# Patient Record
Sex: Female | Born: 1950 | Race: White | Hispanic: No | Marital: Married | Smoking: Never smoker
Health system: Southern US, Community
[De-identification: ages and names within clinical notes are randomized; demographics above are authoritative.]

## PROBLEM LIST (undated history)

## (undated) DIAGNOSIS — C50919 Malignant neoplasm of unspecified site of unspecified female breast: Secondary | ICD-10-CM

## (undated) DIAGNOSIS — I10 Essential (primary) hypertension: Secondary | ICD-10-CM

## (undated) DIAGNOSIS — K30 Functional dyspepsia: Secondary | ICD-10-CM

## (undated) DIAGNOSIS — E7800 Pure hypercholesterolemia, unspecified: Secondary | ICD-10-CM

## (undated) DIAGNOSIS — K219 Gastro-esophageal reflux disease without esophagitis: Secondary | ICD-10-CM

## (undated) HISTORY — DX: Gastro-esophageal reflux disease without esophagitis: K21.9

## (undated) HISTORY — DX: Pure hypercholesterolemia, unspecified: E78.00

## (undated) HISTORY — DX: Functional dyspepsia: K30

## (undated) HISTORY — DX: Essential (primary) hypertension: I10

---

## 1992-09-03 HISTORY — PX: BREAST LUMPECTOMY: SHX2

## 1998-04-27 ENCOUNTER — Other Ambulatory Visit: Admission: RE | Admit: 1998-04-27 | Discharge: 1998-04-27 | Payer: Self-pay | Admitting: *Deleted

## 1999-10-19 ENCOUNTER — Other Ambulatory Visit: Admission: RE | Admit: 1999-10-19 | Discharge: 1999-10-19 | Payer: Self-pay | Admitting: *Deleted

## 2004-09-03 DIAGNOSIS — Z923 Personal history of irradiation: Secondary | ICD-10-CM

## 2004-09-03 DIAGNOSIS — Z9221 Personal history of antineoplastic chemotherapy: Secondary | ICD-10-CM

## 2004-09-03 DIAGNOSIS — C50919 Malignant neoplasm of unspecified site of unspecified female breast: Secondary | ICD-10-CM

## 2004-09-03 HISTORY — DX: Personal history of antineoplastic chemotherapy: Z92.21

## 2004-09-03 HISTORY — PX: BREAST LUMPECTOMY: SHX2

## 2004-09-03 HISTORY — DX: Malignant neoplasm of unspecified site of unspecified female breast: C50.919

## 2004-09-03 HISTORY — DX: Personal history of irradiation: Z92.3

## 2005-01-26 DIAGNOSIS — Z853 Personal history of malignant neoplasm of breast: Secondary | ICD-10-CM

## 2005-03-02 ENCOUNTER — Encounter: Admission: RE | Admit: 2005-03-02 | Discharge: 2005-03-02 | Payer: Self-pay | Admitting: Obstetrics and Gynecology

## 2005-03-02 ENCOUNTER — Encounter (INDEPENDENT_AMBULATORY_CARE_PROVIDER_SITE_OTHER): Payer: Self-pay | Admitting: Radiology

## 2005-03-02 ENCOUNTER — Encounter (INDEPENDENT_AMBULATORY_CARE_PROVIDER_SITE_OTHER): Payer: Self-pay | Admitting: *Deleted

## 2005-03-07 ENCOUNTER — Ambulatory Visit (HOSPITAL_COMMUNITY): Admission: RE | Admit: 2005-03-07 | Discharge: 2005-03-07 | Payer: Self-pay | Admitting: Obstetrics and Gynecology

## 2005-03-20 ENCOUNTER — Ambulatory Visit (HOSPITAL_COMMUNITY): Admission: RE | Admit: 2005-03-20 | Discharge: 2005-03-20 | Payer: Self-pay | Admitting: General Surgery

## 2005-03-23 ENCOUNTER — Encounter (INDEPENDENT_AMBULATORY_CARE_PROVIDER_SITE_OTHER): Payer: Self-pay | Admitting: Specialist

## 2005-03-23 ENCOUNTER — Ambulatory Visit (HOSPITAL_COMMUNITY): Admission: RE | Admit: 2005-03-23 | Discharge: 2005-03-23 | Payer: Self-pay | Admitting: General Surgery

## 2005-03-23 ENCOUNTER — Ambulatory Visit (HOSPITAL_BASED_OUTPATIENT_CLINIC_OR_DEPARTMENT_OTHER): Admission: RE | Admit: 2005-03-23 | Discharge: 2005-03-23 | Payer: Self-pay | Admitting: General Surgery

## 2005-03-27 ENCOUNTER — Ambulatory Visit: Payer: Self-pay | Admitting: Oncology

## 2005-04-09 ENCOUNTER — Ambulatory Visit: Admission: RE | Admit: 2005-04-09 | Discharge: 2005-05-22 | Payer: Self-pay | Admitting: *Deleted

## 2005-04-12 ENCOUNTER — Encounter (HOSPITAL_COMMUNITY): Admission: RE | Admit: 2005-04-12 | Discharge: 2005-05-12 | Payer: Self-pay | Admitting: Oncology

## 2005-04-25 ENCOUNTER — Ambulatory Visit (HOSPITAL_COMMUNITY): Admission: RE | Admit: 2005-04-25 | Discharge: 2005-04-25 | Payer: Self-pay | Admitting: General Surgery

## 2005-04-25 ENCOUNTER — Ambulatory Visit (HOSPITAL_BASED_OUTPATIENT_CLINIC_OR_DEPARTMENT_OTHER): Admission: RE | Admit: 2005-04-25 | Discharge: 2005-04-25 | Payer: Self-pay | Admitting: General Surgery

## 2005-05-11 ENCOUNTER — Ambulatory Visit (HOSPITAL_COMMUNITY): Admission: RE | Admit: 2005-05-11 | Discharge: 2005-05-11 | Payer: Self-pay | Admitting: Oncology

## 2005-05-17 ENCOUNTER — Ambulatory Visit: Payer: Self-pay | Admitting: Oncology

## 2005-05-22 ENCOUNTER — Ambulatory Visit (HOSPITAL_COMMUNITY): Admission: RE | Admit: 2005-05-22 | Discharge: 2005-05-22 | Payer: Self-pay | Admitting: Oncology

## 2005-07-05 ENCOUNTER — Ambulatory Visit: Payer: Self-pay | Admitting: Oncology

## 2005-08-23 ENCOUNTER — Ambulatory Visit: Payer: Self-pay | Admitting: Oncology

## 2005-09-13 ENCOUNTER — Ambulatory Visit: Admission: RE | Admit: 2005-09-13 | Discharge: 2005-11-23 | Payer: Self-pay | Admitting: *Deleted

## 2005-10-10 ENCOUNTER — Ambulatory Visit: Payer: Self-pay | Admitting: Oncology

## 2005-12-05 ENCOUNTER — Ambulatory Visit: Payer: Self-pay | Admitting: Oncology

## 2005-12-06 LAB — CBC WITH DIFFERENTIAL/PLATELET
Basophils Absolute: 0 10*3/uL (ref 0.0–0.1)
Eosinophils Absolute: 0.1 10*3/uL (ref 0.0–0.5)
HGB: 11.5 g/dL — ABNORMAL LOW (ref 11.6–15.9)
LYMPH%: 23.2 % (ref 14.0–48.0)
MCV: 82.9 fL (ref 81.0–101.0)
MONO%: 13 % (ref 0.0–13.0)
NEUT#: 2.1 10*3/uL (ref 1.5–6.5)
Platelets: 198 10*3/uL (ref 145–400)

## 2005-12-19 LAB — COMPREHENSIVE METABOLIC PANEL
BUN: 10 mg/dL (ref 6–23)
CO2: 28 mEq/L (ref 19–32)
Calcium: 9.4 mg/dL (ref 8.4–10.5)
Chloride: 103 mEq/L (ref 96–112)
Creatinine, Ser: 0.8 mg/dL (ref 0.4–1.2)
Glucose, Bld: 115 mg/dL — ABNORMAL HIGH (ref 70–99)

## 2005-12-19 LAB — CBC WITH DIFFERENTIAL/PLATELET
BASO%: 1.1 % (ref 0.0–2.0)
LYMPH%: 22.5 % (ref 14.0–48.0)
MCH: 28.1 pg (ref 26.0–34.0)
MCHC: 33.6 g/dL (ref 32.0–36.0)
MCV: 83.6 fL (ref 81.0–101.0)
MONO%: 8.9 % (ref 0.0–13.0)
Platelets: 200 10*3/uL (ref 145–400)
RBC: 3.95 10*6/uL (ref 3.70–5.32)

## 2005-12-19 LAB — CANCER ANTIGEN 27.29: CA 27.29: 27 U/mL (ref 0–39)

## 2005-12-19 LAB — LACTATE DEHYDROGENASE: LDH: 190 U/L (ref 94–250)

## 2006-01-07 ENCOUNTER — Encounter (HOSPITAL_COMMUNITY): Admission: RE | Admit: 2006-01-07 | Discharge: 2006-02-06 | Payer: Self-pay | Admitting: Oncology

## 2006-01-11 ENCOUNTER — Ambulatory Visit (HOSPITAL_COMMUNITY): Admission: RE | Admit: 2006-01-11 | Discharge: 2006-01-11 | Payer: Self-pay | Admitting: Oncology

## 2006-01-23 ENCOUNTER — Ambulatory Visit: Payer: Self-pay | Admitting: Oncology

## 2006-01-25 LAB — CBC WITH DIFFERENTIAL/PLATELET
Basophils Absolute: 0 10*3/uL (ref 0.0–0.1)
Eosinophils Absolute: 0 10*3/uL (ref 0.0–0.5)
HCT: 34.3 % — ABNORMAL LOW (ref 34.8–46.6)
HGB: 11.7 g/dL (ref 11.6–15.9)
MCV: 84.6 fL (ref 81.0–101.0)
MONO%: 6.9 % (ref 0.0–13.0)
NEUT#: 3.1 10*3/uL (ref 1.5–6.5)
NEUT%: 70.3 % (ref 39.6–76.8)
RDW: 15.5 % — ABNORMAL HIGH (ref 11.3–14.5)

## 2006-03-07 ENCOUNTER — Encounter: Admission: RE | Admit: 2006-03-07 | Discharge: 2006-03-07 | Payer: Self-pay | Admitting: Oncology

## 2006-04-02 ENCOUNTER — Ambulatory Visit: Payer: Self-pay | Admitting: Oncology

## 2006-04-03 LAB — CBC WITH DIFFERENTIAL/PLATELET
BASO%: 0.6 % (ref 0.0–2.0)
EOS%: 0.6 % (ref 0.0–7.0)
MCH: 30.5 pg (ref 26.0–34.0)
MCHC: 34.7 g/dL (ref 32.0–36.0)
MCV: 88 fL (ref 81.0–101.0)
MONO%: 5.8 % (ref 0.0–13.0)
RBC: 3.88 10*6/uL (ref 3.70–5.32)
RDW: 14.9 % — ABNORMAL HIGH (ref 11.3–14.5)
lymph#: 1 10*3/uL (ref 0.9–3.3)

## 2006-04-03 LAB — COMPREHENSIVE METABOLIC PANEL
AST: 16 U/L (ref 0–37)
Alkaline Phosphatase: 48 U/L (ref 39–117)
BUN: 13 mg/dL (ref 6–23)
Calcium: 9.6 mg/dL (ref 8.4–10.5)
Chloride: 104 mEq/L (ref 96–112)
Creatinine, Ser: 0.84 mg/dL (ref 0.40–1.20)
Total Bilirubin: 0.6 mg/dL (ref 0.3–1.2)

## 2006-04-03 LAB — CANCER ANTIGEN 27.29: CA 27.29: 26 U/mL (ref 0–39)

## 2006-04-10 ENCOUNTER — Ambulatory Visit (HOSPITAL_COMMUNITY): Admission: RE | Admit: 2006-04-10 | Discharge: 2006-04-10 | Payer: Self-pay | Admitting: Oncology

## 2006-04-11 ENCOUNTER — Ambulatory Visit (HOSPITAL_COMMUNITY): Admission: RE | Admit: 2006-04-11 | Discharge: 2006-04-11 | Payer: Self-pay | Admitting: Oncology

## 2006-05-14 ENCOUNTER — Other Ambulatory Visit: Admission: RE | Admit: 2006-05-14 | Discharge: 2006-05-14 | Payer: Self-pay | Admitting: Obstetrics and Gynecology

## 2006-05-24 ENCOUNTER — Ambulatory Visit (HOSPITAL_COMMUNITY): Admission: RE | Admit: 2006-05-24 | Discharge: 2006-05-24 | Payer: Self-pay | Admitting: Family Medicine

## 2006-06-17 ENCOUNTER — Ambulatory Visit: Payer: Self-pay | Admitting: Oncology

## 2006-06-19 LAB — COMPREHENSIVE METABOLIC PANEL
ALT: 9 U/L (ref 0–40)
CO2: 26 mEq/L (ref 19–32)
Calcium: 9.6 mg/dL (ref 8.4–10.5)
Chloride: 106 mEq/L (ref 96–112)
Creatinine, Ser: 0.82 mg/dL (ref 0.40–1.20)
Glucose, Bld: 91 mg/dL (ref 70–99)
Sodium: 141 mEq/L (ref 135–145)
Total Bilirubin: 0.5 mg/dL (ref 0.3–1.2)
Total Protein: 7.3 g/dL (ref 6.0–8.3)

## 2006-06-19 LAB — CBC WITH DIFFERENTIAL/PLATELET
BASO%: 0.5 % (ref 0.0–2.0)
Eosinophils Absolute: 0 10*3/uL (ref 0.0–0.5)
HCT: 39.1 % (ref 34.8–46.6)
LYMPH%: 15.9 % (ref 14.0–48.0)
MCHC: 34.5 g/dL (ref 32.0–36.0)
MONO#: 0.3 10*3/uL (ref 0.1–0.9)
NEUT#: 5.1 10*3/uL (ref 1.5–6.5)
NEUT%: 78.2 % — ABNORMAL HIGH (ref 39.6–76.8)
Platelets: 193 10*3/uL (ref 145–400)
WBC: 6.6 10*3/uL (ref 3.9–10.0)
lymph#: 1 10*3/uL (ref 0.9–3.3)

## 2006-06-19 LAB — CANCER ANTIGEN 27.29: CA 27.29: 39 U/mL (ref 0–39)

## 2006-06-19 LAB — LACTATE DEHYDROGENASE: LDH: 161 U/L (ref 94–250)

## 2006-06-20 ENCOUNTER — Ambulatory Visit (HOSPITAL_COMMUNITY): Admission: RE | Admit: 2006-06-20 | Discharge: 2006-06-20 | Payer: Self-pay | Admitting: Oncology

## 2006-08-16 ENCOUNTER — Encounter (HOSPITAL_COMMUNITY): Admission: RE | Admit: 2006-08-16 | Discharge: 2006-09-02 | Payer: Self-pay | Admitting: Oncology

## 2006-08-16 ENCOUNTER — Ambulatory Visit (HOSPITAL_COMMUNITY): Payer: Self-pay | Admitting: Oncology

## 2006-08-30 ENCOUNTER — Ambulatory Visit: Admission: RE | Admit: 2006-08-30 | Discharge: 2006-11-15 | Payer: Self-pay | Admitting: *Deleted

## 2006-11-12 ENCOUNTER — Ambulatory Visit (HOSPITAL_COMMUNITY): Payer: Self-pay | Admitting: Oncology

## 2006-11-12 ENCOUNTER — Encounter (HOSPITAL_COMMUNITY): Admission: RE | Admit: 2006-11-12 | Discharge: 2006-12-12 | Payer: Self-pay | Admitting: Oncology

## 2006-11-20 ENCOUNTER — Encounter (HOSPITAL_COMMUNITY): Admission: RE | Admit: 2006-11-20 | Discharge: 2006-12-20 | Payer: Self-pay | Admitting: Oncology

## 2007-03-19 ENCOUNTER — Encounter: Admission: RE | Admit: 2007-03-19 | Discharge: 2007-03-19 | Payer: Self-pay | Admitting: Oncology

## 2007-04-09 ENCOUNTER — Ambulatory Visit: Payer: Self-pay | Admitting: Gastroenterology

## 2007-04-16 ENCOUNTER — Encounter (HOSPITAL_COMMUNITY): Admission: RE | Admit: 2007-04-16 | Discharge: 2007-05-16 | Payer: Self-pay | Admitting: Oncology

## 2007-05-13 ENCOUNTER — Ambulatory Visit (HOSPITAL_COMMUNITY): Payer: Self-pay | Admitting: Oncology

## 2007-05-28 ENCOUNTER — Encounter: Admission: RE | Admit: 2007-05-28 | Discharge: 2007-06-30 | Payer: Self-pay | Admitting: Oncology

## 2007-06-10 ENCOUNTER — Encounter (HOSPITAL_COMMUNITY): Admission: RE | Admit: 2007-06-10 | Discharge: 2007-07-10 | Payer: Self-pay | Admitting: Oncology

## 2007-08-20 ENCOUNTER — Ambulatory Visit: Payer: Self-pay | Admitting: Gastroenterology

## 2007-08-25 ENCOUNTER — Ambulatory Visit (HOSPITAL_COMMUNITY): Payer: Self-pay | Admitting: Oncology

## 2007-09-04 HISTORY — PX: ESOPHAGOGASTRODUODENOSCOPY: SHX1529

## 2007-09-04 HISTORY — PX: COLONOSCOPY: SHX5424

## 2007-09-09 ENCOUNTER — Encounter (HOSPITAL_COMMUNITY): Admission: RE | Admit: 2007-09-09 | Discharge: 2007-10-09 | Payer: Self-pay | Admitting: Oncology

## 2007-09-22 ENCOUNTER — Ambulatory Visit (HOSPITAL_COMMUNITY): Admission: RE | Admit: 2007-09-22 | Discharge: 2007-09-22 | Payer: Self-pay | Admitting: Gastroenterology

## 2007-09-22 ENCOUNTER — Ambulatory Visit: Payer: Self-pay | Admitting: Gastroenterology

## 2007-10-31 ENCOUNTER — Other Ambulatory Visit: Admission: RE | Admit: 2007-10-31 | Discharge: 2007-10-31 | Payer: Self-pay | Admitting: Dentistry

## 2007-10-31 ENCOUNTER — Encounter (INDEPENDENT_AMBULATORY_CARE_PROVIDER_SITE_OTHER): Payer: Self-pay | Admitting: Dentistry

## 2007-11-11 ENCOUNTER — Encounter (HOSPITAL_COMMUNITY): Admission: RE | Admit: 2007-11-11 | Discharge: 2007-12-11 | Payer: Self-pay | Admitting: Oncology

## 2007-11-11 ENCOUNTER — Ambulatory Visit (HOSPITAL_COMMUNITY): Payer: Self-pay | Admitting: Oncology

## 2008-03-19 ENCOUNTER — Encounter: Admission: RE | Admit: 2008-03-19 | Discharge: 2008-03-19 | Payer: Self-pay | Admitting: Oncology

## 2008-06-01 ENCOUNTER — Ambulatory Visit (HOSPITAL_COMMUNITY): Payer: Self-pay | Admitting: Oncology

## 2008-07-26 ENCOUNTER — Encounter (HOSPITAL_COMMUNITY): Admission: RE | Admit: 2008-07-26 | Discharge: 2008-08-25 | Payer: Self-pay | Admitting: Oncology

## 2008-07-26 ENCOUNTER — Ambulatory Visit (HOSPITAL_COMMUNITY): Payer: Self-pay | Admitting: Oncology

## 2008-08-10 ENCOUNTER — Ambulatory Visit: Payer: Self-pay | Admitting: Gastroenterology

## 2008-08-11 ENCOUNTER — Ambulatory Visit (HOSPITAL_COMMUNITY): Admission: RE | Admit: 2008-08-11 | Discharge: 2008-08-11 | Payer: Self-pay | Admitting: Gastroenterology

## 2008-11-29 ENCOUNTER — Encounter (HOSPITAL_COMMUNITY): Admission: RE | Admit: 2008-11-29 | Discharge: 2008-11-29 | Payer: Self-pay | Admitting: Oncology

## 2008-11-29 ENCOUNTER — Ambulatory Visit (HOSPITAL_COMMUNITY): Payer: Self-pay | Admitting: Oncology

## 2009-01-26 DIAGNOSIS — K649 Unspecified hemorrhoids: Secondary | ICD-10-CM | POA: Insufficient documentation

## 2009-01-27 ENCOUNTER — Ambulatory Visit: Payer: Self-pay | Admitting: Gastroenterology

## 2009-01-27 DIAGNOSIS — K219 Gastro-esophageal reflux disease without esophagitis: Secondary | ICD-10-CM | POA: Insufficient documentation

## 2009-03-24 ENCOUNTER — Ambulatory Visit (HOSPITAL_COMMUNITY): Admission: RE | Admit: 2009-03-24 | Discharge: 2009-03-24 | Payer: Self-pay | Admitting: Oncology

## 2009-04-18 ENCOUNTER — Ambulatory Visit (HOSPITAL_COMMUNITY): Payer: Self-pay | Admitting: Oncology

## 2009-04-20 ENCOUNTER — Ambulatory Visit (HOSPITAL_COMMUNITY): Admission: RE | Admit: 2009-04-20 | Discharge: 2009-04-20 | Payer: Self-pay | Admitting: Oncology

## 2009-07-18 ENCOUNTER — Ambulatory Visit (HOSPITAL_COMMUNITY): Admission: RE | Admit: 2009-07-18 | Discharge: 2009-07-18 | Payer: Self-pay | Admitting: Obstetrics and Gynecology

## 2009-07-18 ENCOUNTER — Encounter (INDEPENDENT_AMBULATORY_CARE_PROVIDER_SITE_OTHER): Payer: Self-pay | Admitting: Obstetrics and Gynecology

## 2009-08-03 ENCOUNTER — Ambulatory Visit: Payer: Self-pay | Admitting: Gastroenterology

## 2009-08-17 ENCOUNTER — Ambulatory Visit (HOSPITAL_COMMUNITY): Payer: Self-pay | Admitting: Oncology

## 2009-08-30 ENCOUNTER — Encounter: Payer: Self-pay | Admitting: Gastroenterology

## 2010-02-08 ENCOUNTER — Ambulatory Visit (HOSPITAL_COMMUNITY): Admission: RE | Admit: 2010-02-08 | Discharge: 2010-02-08 | Payer: Self-pay | Admitting: Family Medicine

## 2010-02-24 ENCOUNTER — Encounter (INDEPENDENT_AMBULATORY_CARE_PROVIDER_SITE_OTHER): Payer: Self-pay | Admitting: *Deleted

## 2010-03-13 ENCOUNTER — Ambulatory Visit (HOSPITAL_COMMUNITY): Payer: Self-pay | Admitting: Oncology

## 2010-03-13 ENCOUNTER — Encounter (HOSPITAL_COMMUNITY): Admission: RE | Admit: 2010-03-13 | Discharge: 2010-04-12 | Payer: Self-pay | Admitting: Oncology

## 2010-03-16 ENCOUNTER — Ambulatory Visit (HOSPITAL_COMMUNITY): Admission: RE | Admit: 2010-03-16 | Discharge: 2010-03-16 | Payer: Self-pay | Admitting: Oncology

## 2010-03-29 ENCOUNTER — Ambulatory Visit (HOSPITAL_COMMUNITY): Admission: RE | Admit: 2010-03-29 | Discharge: 2010-03-29 | Payer: Self-pay | Admitting: Oncology

## 2010-08-08 ENCOUNTER — Ambulatory Visit: Payer: Self-pay | Admitting: Gastroenterology

## 2010-08-10 ENCOUNTER — Ambulatory Visit (HOSPITAL_COMMUNITY): Payer: Self-pay | Admitting: Oncology

## 2010-08-10 ENCOUNTER — Encounter (HOSPITAL_COMMUNITY)
Admission: RE | Admit: 2010-08-10 | Discharge: 2010-09-09 | Payer: Self-pay | Source: Home / Self Care | Attending: Oncology | Admitting: Oncology

## 2010-09-11 ENCOUNTER — Ambulatory Visit (HOSPITAL_COMMUNITY)
Admission: RE | Admit: 2010-09-11 | Discharge: 2010-10-03 | Payer: Self-pay | Source: Home / Self Care | Attending: Oncology | Admitting: Oncology

## 2010-09-11 ENCOUNTER — Encounter (HOSPITAL_COMMUNITY)
Admission: RE | Admit: 2010-09-11 | Discharge: 2010-10-03 | Payer: Self-pay | Source: Home / Self Care | Attending: Oncology | Admitting: Oncology

## 2010-10-03 NOTE — Letter (Signed)
Summary: Recall Office Visit  [facility]  [address]   [place], [place] [address]   Phone: [phone]  Fax: [phone]      February 24, 2010   [name] [name] [address] [place], [place]  [address] 08/11/1951   Dear Ms. Fuller,   According to our records, it is time for you to schedule a follow-up office visit with [place].   At your convenience, please call [phone] to schedule an office visit. If you have any questions, concerns, or feel that this letter is in error, we would appreciate your call.   Sincerely,    Diana Eves  [facility] Ph: [phone]   Fax: [phone]

## 2010-10-05 NOTE — Assessment & Plan Note (Addendum)
Summary: 40YR F/U RX REFILL/LAW   Visit Type:  Initial Visit Primary Care Provider:  Sudie Bailey, M.D.  CC:  F/U reflux.  History of Present Illness: [name] [name] returns today for a 1-year-follow-up,  hx of reflux, no issues, on Nexium twice/day. would like to wean to once a day. avoiding tomatoes, foods that exacerbate. No nausea. Denies abd pain. BM regular, did note one episode of red specks with wiping recently after straining. has hx of hemorrhoids. TCS/EGD 09/2007: normal. Denies loss of appetite or weight loss. No problems.   Current Medications (verified): 1)  Tamoxifen Citrate 10 Mg Tabs (Tamoxifen Citrate) .... Two Times A Day 2)  Vitamind D .... Two Times A Day 3)  Stool Softner .... Take 1 Tablet By Mouth Once A Day 4)  Calcium D .... Two Times A Day 5)  Nexium 40 Mg Cpdr (Esomeprazole Magnesium) .... Take 1 Tablet By Mouth Bid  Allergies (verified): 1)  ! * Sulfur  Past History:  Past Medical History: Last updated: 08/03/2009 Breast cancer 2006-chemo/XRT GERD Dyspepsia Hemorrhoids Anxiety SULFA ALLERGY  Past Surgical History: Last updated: 01/27/2009 Left Lumpectomy 2006  Review of Systems General:  Denies fever, chills, and anorexia. Eyes:  Denies blurring, irritation, and discharge. ENT:  Denies sore throat, hoarseness, and difficulty swallowing. CV:  Denies chest pains and syncope. Resp:  Denies dyspnea at rest and wheezing. GI:  Denies difficulty swallowing, pain on swallowing, nausea, indigestion/heartburn, vomiting, abdominal pain, bloody BM's, and black BMs. GU:  Denies urinary burning and urinary frequency. MS:  Denies joint pain / LOM and joint stiffness. Derm:  Denies rash, itching, and dry skin. Neuro:  Denies weakness and syncope. Psych:  Denies depression and anxiety. Endo:  Denies cold intolerance and heat intolerance. Heme:  Denies bruising and bleeding.  Vital Signs:  Patient profile:   60 year old female Height:      66 inches Weight:       137 pounds BMI:     22.19 Temp:     97.9 degrees F rectal Pulse rate:   72 / minute BP sitting:   142 / 78  (left arm) Cuff size:   regular  Vitals Entered By: Cloria Spring LPN (August 08, 2010 2:54 PM)  Physical Exam  General:  Well developed, well nourished, no acute distress. Lungs:  Clear throughout to auscultation. Heart:  Regular rate and rhythm; no murmurs, rubs,  or bruits. Abdomen:  Soft, nontender and nondistended. No masses, hepatosplenomegaly or hernias noted. Normal bowel sounds.without guarding and without rebound.   Msk:  Symmetrical with no gross deformities. Normal posture. Neurologic:  Alert and  oriented x4;  grossly normal neurologically. Psych:  Alert and cooperative. Normal mood and affect.  Impression & Recommendations:  Problem # 1:  GERD (ICD-8.2)  60 year old female doing well on Nexium twice/day. May do trial of once daily. Refill sent to pharmacy of choice.  F/U in one year.  Orders: Est. Patient Level II ([address])  Problem # 2:  Hx of HEMORRHOIDS (ICD-455.6)  hx of hemorrhoids, last TCS 09/2007, essentially normal. Reported rare occasion of small red specks with wiping, none in toilet. LIkely r/t episode of straining. Informed to monitor for any further signs, call office if continues.   Add fiber of choice to diet Colace two times a day  Miralax as needed F/U in 1 year  Orders: Est. Patient Level II ([address]) Prescriptions: NEXIUM 40 MG CPDR (ESOMEPRAZOLE MAGNESIUM) take 1 by mouth twice a day  #60 x  11   Entered and Authorized by:   Gerrit Halls NP   Signed by:   Gerrit Halls NP on 08/08/2010   Method used:   Faxed to ...       [facility]* (retail)       [address]       [address] Shipping/mailing       [place], [place]  [address]       Ph: [phone]       Fax: [phone]   RxID:   [id number]

## 2010-11-19 LAB — DIFFERENTIAL
Lymphs Abs: 1.1 10*3/uL (ref 0.7–4.0)
Monocytes Relative: 6 % (ref 3–12)
Neutro Abs: 2.7 10*3/uL (ref 1.7–7.7)
Neutrophils Relative %: 67 % (ref 43–77)

## 2010-11-19 LAB — COMPREHENSIVE METABOLIC PANEL
ALT: 11 U/L (ref 0–35)
BUN: 11 mg/dL (ref 6–23)
Calcium: 9.6 mg/dL (ref 8.4–10.5)
Glucose, Bld: 115 mg/dL — ABNORMAL HIGH (ref 70–99)
Sodium: 140 mEq/L (ref 135–145)
Total Protein: 7.1 g/dL (ref 6.0–8.3)

## 2010-11-19 LAB — VITAMIN D 1,25 DIHYDROXY
Vitamin D 1, 25 (OH)2 Total: 38 pg/mL (ref 18–72)
Vitamin D2 1, 25 (OH)2: <8 pg/mL
Vitamin D3 1, 25 (OH)2: 38 pg/mL

## 2010-11-19 LAB — CBC
HCT: 35.7 % — ABNORMAL LOW (ref 36.0–46.0)
Hemoglobin: 12.1 g/dL (ref 12.0–15.0)
MCH: 30.1 pg (ref 26.0–34.0)
MCHC: 33.9 g/dL (ref 30.0–36.0)
MCV: 88.8 fL (ref 78.0–100.0)
Platelets: 150 K/uL (ref 150–400)
RBC: 4.01 MIL/uL (ref 3.87–5.11)
RDW: 13.8 % (ref 11.5–15.5)
WBC: 4.1 K/uL (ref 4.0–10.5)

## 2010-11-20 ENCOUNTER — Encounter: Payer: Self-pay | Admitting: Gastroenterology

## 2010-11-30 NOTE — Medication Information (Signed)
Summary: PA for Nexium  PA for Nexium   Imported By: Hendricks Limes LPN 29/52/8413 24:40:10  _____________________________________________________________________  External Attachment:    Type:   Image     Comment:   External Document

## 2010-12-06 LAB — CBC
RBC: 4.23 MIL/uL (ref 3.87–5.11)
WBC: 4.7 10*3/uL (ref 4.0–10.5)

## 2011-01-08 ENCOUNTER — Encounter (HOSPITAL_COMMUNITY): Payer: 59 | Attending: Oncology

## 2011-01-08 DIAGNOSIS — C50919 Malignant neoplasm of unspecified site of unspecified female breast: Secondary | ICD-10-CM

## 2011-01-16 NOTE — Op Note (Signed)
[name] [name], [name] [name]                  ACCOUNT NO.:  [id number]   MEDICAL RECORD NO.:  [id number]          PATIENT TYPE:  AMB   LOCATION:  DAY                           FACILITY:  [facility]   PHYSICIAN:  Kassie Mends, M.D.      DATE OF BIRTH:  Jan 20, 1951   DATE OF PROCEDURE:  09/22/2007  DATE OF DISCHARGE:                               OPERATIVE REPORT   PROCEDURES:  1. Colonoscopy.  2. Esophagogastroduodenoscopy.   INDICATIONS FOR EXAM:  [name] [name] is a 60 year old female who presents  for average-risk colon cancer screening and evaluation for new-onset  dyspepsia.   FINDINGS:  1. Normal colon, without evidence of polyps, masses, inflammatory      changes, diverticula, or arteriovenous malformations.  2. Normal retroflexed view of the rectum.  3. Normal esophagus, without evidence of Barrett's, mass, erosion,      stricture, or ulceration.  4. Normal stomach, duodenal bulb, and second portion of the duodenum.   DIAGNOSIS:  [name]' dyspepsia is likely related to the use of  ibuprofen and nonsteroidal antiinflammatory drug-induced gastritis.   RECOMMENDATIONS:  1. She should follow a high-fiber diet.  She was given a handout on a      high-fiber diet.  2. Screening colonoscopy in 10 years.  3. She may discontinue the use of Nexium, and should use a proton pump      inhibitor if she is using an antiinflammatory drug.   MEDICATIONS:  1. Demerol 50 mg IV.  2. Versed 6 mg IV.   PROCEDURE TECHNIQUE:  Physical exam was performed, and informed consent  was obtained from the patient after explaining the benefits, risks, and  alternatives to the procedure.  The patient was connected to the monitor  and placed in the left lateral position.  Continuous oxygen was provided  by nasal cannula, and IV medicine administered through an indwelling  cannula.  After administration, sedation, and rectal exam, the patient's  rectum was intubated, and the scope was advanced under direct  visualization  to the cecum.  The scope was removed slowly by carefully  examining the color, texture, anatomy, and integrity of the mucosa on  the way out.   After the colonoscopy, the patient's esophagus was intubated, and the  scope was advanced under direct visualization to the second portion of  the duodenum.  The scope was removed slowly by carefully examining the  color, texture, anatomy, and integrity of the mucosa on the way out.  The patient was recovered in endoscopy and discharged home in  satisfactory condition.      Kassie Mends, M.D.  Electronically Signed     SM/MEDQ  D:  09/22/2007  T:  09/22/2007  Job:  [id number]   cc:   Mila Homer. Sudie Bailey, M.D.  Fax: [phone]

## 2011-01-16 NOTE — Consult Note (Signed)
NAMEKRINA, [name] [name]                  ACCOUNT NO.:  [id number]   MEDICAL RECORD NO.:  [id number]          PATIENT TYPE:  AMB   LOCATION:  DAY                           FACILITY:  [facility]   PHYSICIAN:  Kassie Mends, M.D.      DATE OF BIRTH:  Jun 12, 1951   DATE OF CONSULTATION:  08/20/2007  DATE OF DISCHARGE:                                 CONSULTATION   REFERRING PHYSICIAN:  Mila Homer. Sudie Bailey, M.D.   PROBLEMS:  1. Breast cancer with left lumpectomy, chemotherapy and external      radiation therapy.  2. Hemorrhoids.  3. Gastroesophageal reflux disease.   SUBJECTIVE:  [name] [name] is a 60 year old female who presents as a return  patient visit.  She was placed on Nexium in the summer and was doing  fairly well until she began to consume Advil on a regular basis.  She  has consumed Advil several times a day for 4-5 days and her stomach  began to bother her.  She describes it as an empty feeling.  It is not  easily relieved with food and her discomfort is made worse sometimes  after eating.  She does not smoke.  Does not drink any alcohol.  Her  last dose of ibuprofen was a few weeks ago.  She declined endoscopic  evaluation back in August due to insurance issues.   MEDICATIONS:  1. Tamoxifen 10 mg b.i.d.  2. Lorazepam 1 mg daily.  3. Vitamin D.  4. Stool softener.  5. Nexium 40 mg daily.   OBJECTIVE:  VITAL SIGNS:  Weight 132 pounds (unchanged since August  2008), height 5 feet 7 inches, temperature 97.9, blood pressure 120/88,  pulse 16.  GENERAL:  She is in no apparent distress, alert and orient x 4.  LUNGS:  Clear to auscultation bilaterally.  CARDIOVASCULAR:  Regular rhythm, no  murmur, normal S1-S2.  ABDOMEN:  Bowel sounds are present, soft,  nondistended.  Mild tenderness to palpation in the epigastrium without  rebound or guarding.   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  She had a negative H.  pylori serology in August 2008.  She also needs age-appropriate colon  cancer screening. Thank you for allowing me to see [name] [name] in  consultation.  My recommendations follow.   RECOMMENDATIONS:  1. Refill Nexium for 1 year.  She should take it 30 minutes before her      first meal.  2. She should avoid gastric irritants.  She is given a handout on      gastric irritants.  3. She will be scheduled for a colonoscopy followed by an EGD in      January 2009.  She is given the MoviPrep.      The colonoscopy will be for average risk colon cancer screening.      Her upper endoscopy will be for new onset dyspepsia.  4. She has a follow up appointment to see me in 6 months.      Kassie Mends, M.D.  Electronically  Signed     SM/MEDQ  D:  08/20/2007  T:  08/21/2007  Job:  [id number]   cc:   Mila Homer. Sudie Bailey, M.D.  Fax: [phone]

## 2011-01-16 NOTE — Consult Note (Signed)
[name] [name], [name] [name]                  ACCOUNT NO.:  [id number]   MEDICAL RECORD NO.:  [id number]          PATIENT TYPE:  AMB   LOCATION:  DAY                           FACILITY:  [facility]   PHYSICIAN:  Kassie Mends, M.D.      DATE OF BIRTH:  [birth year]   DATE OF CONSULTATION:  04/09/2007  DATE OF DISCHARGE:                                 CONSULTATION   REFERRING PHYSICIAN:  Mila Homer. Sudie Bailey, M.D.   REASON FOR CONSULTATION:  Reflux.   HISTORY OF PRESENT ILLNESS:  [name] [name] is a 60 year old female who was  diagnosed with breast cancer and had a lumpectomy, chemotherapy, XRT and  two positive nodes.  She reports having reflux when she was being  treated for her breast cancer and was started on Nexium which helped for  approximately a year.  She had no problems until four months ago.  She  reports having a H. pylori serology checked approximately two years ago  which was negative.  She has had no imaging studies of her upper GI  tract.  She had been dealing with her mother who was critically ill and  eventually passed away.  Her stomach acted up and she took Nexium for  four to six weeks and it helped.  She went off of it and now  occasionally she has this empty feeling and she describes it as well  as a terrible hunger pain all the time regardless of whether she eats.  She ate tomatoes yesterday and it bothered her.  She occasionally has  indigestion.  She describes her indigestion as feeling as if something  was stuck in her chest that she treats it with Maalox which helps.  She  did tried Prilosec for her empty feeling but it did not help.  She does  not take BC or Goody powders, but she was on aspirin due to the risk of  clots with taking tamoxifen.  She stopped the aspirin but it did not  change her symptoms.  She uses ibuprofen less than once a week.  She has  not had any weight loss.  She did have straining and saw a small amount  of blood in her stool.  She denies any nausea,  vomiting or blood in her  stool.  She has one solid bowel movement a day.   PAST MEDICAL HISTORY:  1. Breast cancer.  2. Hemorrhoids.   PAST SURGICAL HISTORY:  Left lumpectomy.   ALLERGIES:  SULFA   MEDICATIONS:  1. Tamoxifen 10 mg twice daily.  2. Lorazepam 1 mg daily.  3. Aspirin 81 mg daily.  4. Vitamin D 400 international units b.i.d.  5. Stool softener daily.   FAMILY HISTORY:  Her mother had horrible gastroesophageal reflux  disease.  She has siblings with reflux.  She has no family history of  breast, ovarian or uterine cancer.  Her maternal aunt had colon cancer.   SOCIAL HISTORY:  She is married and works at [employer].  She  denies any tobacco or alcohol use.   REVIEW OF SYSTEMS:  Per the HPI, otherwise all systems are negative.   PHYSICAL EXAM:  VITAL SIGNS:  Weight 131 pounds, height 5 feet 6 inches,  temperature 93, blood pressure 144/80, pulse 88.  GENERAL APPEARANCE:  She is in no apparent distress, alert and oriented  x4.  HEENT:  Atraumatic, normocephalic.  Pupils equal, react to light.  Mouth no oral lesions.  Posterior pharynx without erythema or exudate.  NECK:  Full range of motion.  No lymphadenopathy.  LUNGS:  Clear to  auscultation bilaterally.  CARDIOVASCULAR:  Regular rhythm, no murmur,  normal S1 and S2.  ABDOMEN:  Bowel sounds present, soft, nontender,  nondistended.  No rebound or guarding, abdominal bruits, pulsatile  masses or hepatosplenomegaly.  EXTREMITIES:  Without cyanosis, clubbing  or edema.  NEUROLOGIC:  She has no focal neurologic deficits.   ASSESSMENT:  [name] [name] is a 60 year old female who presents with a  feeling of emptiness and terrible hunger pain all the time in her  epigastrium.  She has a low likelihood that she has metastatic breast  cancer but it is in the differential diagnosis.  The most likely  etiology is nonsteroidal anti-inflammatory drug gastritis or  Helicobacter pylori gastritis and low likelihood of GI  malignacy(  adenoCA or lymphoma).  She wanted to have her workup noninvasively.   Thank you for allowing me to see [name] [name] in consultation.  My  recommendations follow.   RECOMMENDATIONS:  1. Will check her H. pylori serology and treat if positive.  If it is      negative, then she will return for both an upper endoscopy and a      colonoscopy.  She states she knows she needs to have a colonoscopy      but she will call me when she is ready to have the procedure done.  2. She should continue her Nexium.  She is given a prescription for      Nexium and samples.  She is also given the Nexium copay      reimbursement card.  3. She has a follow-up appointment to see me in two to three months.      Kassie Mends, M.D.  Electronically Signed     SM/MEDQ  D:  04/10/2007  T:  04/11/2007  Job:  [id number]

## 2011-01-16 NOTE — Assessment & Plan Note (Signed)
NAMEALIANIS, TRIMMER                   CHART#:  [id number]   DATE:  08/10/2008                       DOB:  1951-05-07   PROBLEM LIST:  1. Dyspepsia and epigastric discomfort.  2. History of breast cancer with left lumpectomy, chemotherapy, and      radiation.  3. Hemorrhoids.  4. Gastroesophageal reflux disease.   SUBJECTIVE:  The patient is a 60 year old female, who presents as a  return patient visit.  She was last seen in December 2008.  She was  doing fairly well until the summer when she start to eat tomatoes and it  made her stomach hurt.  She denies taking any ibuprofen or Advil.  She  did try to go off the Nexium of the last year and had flares.  In  October, she went on a trip and her stomach started to hurt again.  She  states it is epigastric discomfort, but not really a pain.  Feels like  I am hungry.  Sometimes, it is relieved by eating.  It has no ram or  reason to it.  She denies any diarrhea.  She is having regular bowel  movements.  She has had no blood in her stool.  Her weight has been  same.  Her appetite is good.  She does take lorazepam at nighttime for  sleeping.  She started that when she had a management for her breast  cancer because she was having hot flashes and insomnia.  She denies any  tobacco or alcohol use.   MEDICATIONS:  1. Tamoxifen.  2. Lorazepam.  3. Vitamin E.  4. Stool softener.  5. Calcium and vitamin D.  6. Nexium 40 mg daily.   OBJECTIVE:  VITAL SIGNS:  Weight 130 pounds, height 5 feet 6 inches,  temperature 97.7, blood pressure 138/76, and pulse 96.  GENERAL:  She is in no apparent distress.  Alert and oriented x4.HEENT:  Atraumatic and normocephalic.  Pupils are equal and reactive to light.  Mouth, no oral lesions.  Posterior pharynx without erythema or  exudate.NECK:  Full range of motion.  No lymphadenopathy.LUNGS:  Clear  to auscultation bilaterally.CARDIOVASCULAR:  Regular rhythm.  No murmur.  Normal S1 and S2.ABDOMEN:  Bowel  sounds are present.  Soft, nontender,  and nondistended.  No rebound or guarding.  EXTREMITIES:  No cyanosis or edema.NEUROLOGIC:  She has no focal  neurologic deficits.   ASSESSMENT:  The patient is a 60 year old female, who complains of  dyspepsia and epigastric discomfort.  Differential diagnosis includes  non-ulcer dyspepsia, and a low likelihood of pancreatic malignancy.  Thank you for allowing me to see the patient in consultation.  My  recommendations follow.   RECOMMENDATIONS:  1. She should continue her Nexium.  She may increase it to b.i.d. to      see if she can get rid of the hungry feeling.  It is possible that      she has uncontrolled gastroesophageal reflux disease which is      atypical.  2. Will schedule CT scan of the abdomen with IV and oral contrast to      evaluate for her dyspepsia in light of her history of breast      cancer.  Breast cancer can metastasize to the duodenum.  If she is  agreeable, we could consider adding imipramine at nighttime for      sleep as well as for non-ulcer dyspepsia.  I did discuss side      effects with her which include drowsiness, dry eyes, dry mouth, and      urinary retention.  3. She has follow up appointment to see me in 2 months.       Kassie Mends, M.D.  Electronically Signed     SM/MEDQ  D:  08/10/2008  T:  08/11/2008  Job:  [id number]   cc:   Ladona Horns. Mariel Sleet, MD  Lacy Duverney, M.D.

## 2011-01-19 NOTE — Op Note (Signed)
[name] [name]                  ACCOUNT NO.:  [id number]   MEDICAL RECORD NO.:  [id number]          PATIENT TYPE:  AMB   LOCATION:  DSC                          FACILITY:  [facility]   PHYSICIAN:  Rose Phi. Maple Hudson, M.D.   DATE OF BIRTH:  Mar 04, 1951   DATE OF PROCEDURE:  03/23/2005  DATE OF DISCHARGE:                                 OPERATIVE REPORT   PREOPERATIVE DIAGNOSIS:  Stage I carcinoma left breast.   POSTOPERATIVE DIAGNOSIS:  Stage II carcinoma left breast.   OPERATION:  1.  Blue dye injection.  2.  Left axillary sentinel lymph node biopsy.  3.  Left partial mastectomy.  4.  Left axillary lymph node dissection.   SURGEON:  Rose Phi. Maple Hudson, M.D.   ANESTHESIA:  General.   OPERATIVE PROCEDURE:  Prior to going to the operating room, 1 mCi of  technetium sulfur colloid was injected intradermally.   After suitable general anesthesia was induced, the patient was placed in the  supine position with the arms extended on the arm board.  A 5 mL mixture of  2 mL of methylene blue and 3 mL of injectable saline was then injected in  the subareolar tissue and the breast gently massaged for three minutes. We  then prepped and draped her in the standard fashion.   A short transverse axillary incision was made with dissection through  subcutaneous tissue to the clavipectoral fascia. Deep to that, were one blue  and hot lymph node and adjacent to it, an enlarged the lymph node, both of  which we removed and submitted to the pathologist.  There were no other hot  or blue nodes.   While that was being evaluated by the pathologist, I outlined the palpable  tumor in the inferior pole of her left breast centered at about the 6:30  position. With traction on the breast, a radial incision was outlined  including an ellipse of skin overlying the palpable tumor. Incision was made  and a wide excision of this palpable tumor was carried out. Hemostasis  obtained with the cautery. Specimen oriented  for the pathologist.   While that was being evaluated by the pathologist, I closed the lower pole  of the breast tissue with interrupted 3-0 Vicryl which was going to give a  good closure without any deformity.  We also closed the skin with  subcuticular 4-0 Monocryl.   At that time, it was reported that the sentinel node was positive for  metastatic disease. I then went back to the axilla and with appropriate  exposure, dissected along the pectoralis major and pectoralis minor muscles,  excising and dissecting free the axillary tissue from there and exposing the  axillary vein. We then dissected along the axillary vein and then the  cutaneous vessels and nerves were clipped and divided and we then dissected  out all the tissue inferior to the vein and from behind the pectoralis minor  muscle. The long thoracic thoracodorsal nerves were identified and preserved  and other cutaneous nerves and vessels were clipped and divided. Following  the removal of  axillary nodes, we had good hemostasis. An 18-French Blake  drain was inserted and brought out through a separate stab wound. The tissue  was closed with 3-0 Vicryl and staples.   It was then reported that the margins on the lumpectomy were clear so we  placed Steri-Strips on the lumpectomy specimen and dressings were applied  and the patient transferred to the recovery room in satisfactory condition  having tolerated procedure well.       PRY/MEDQ  D:  03/23/2005  T:  03/23/2005  Job:  [id number]

## 2011-01-19 NOTE — Op Note (Signed)
[name] [name], [name] [name]                  ACCOUNT NO.:  [id number]   MEDICAL RECORD NO.:  [id number]          PATIENT TYPE:  AMB   LOCATION:  DSC                          FACILITY:  [facility]   PHYSICIAN:  Rose Phi. Maple Hudson, M.D.   DATE OF BIRTH:  11-29-1950   DATE OF PROCEDURE:  04/25/2005  DATE OF DISCHARGE:                                 OPERATIVE REPORT   PREOPERATIVE DIAGNOSIS:  Carcinoma of the left breast.   POSTOPERATIVE DIAGNOSIS:  Carcinoma of the left breast.   OPERATION:  Insertion of Port-A-Cath under fluoroscopic control.   SURGEON:  Rose Phi. Maple Hudson, M.D.   ANESTHESIA:  MAC   OPERATIVE PROCEDURE:  The patient placed on the operating table with arms  down by the side and the face turned to the left. The right upper chest and  neck were prepped and draped in the usual fashion.   Under local anesthesia, a right subclavian puncture was carried out without  difficulty and the guidewire inserted and C-arm fluoroscopy was used to  confirm it in the proper position.   Under local anesthesia, I made an incision on the anterior chest wall and  developed a pocket for the implantable port.   I then tunneled between the subclavian puncture site and the newly developed  pocket and passed the catheter through that and connected it to the Bard X-  port and placed the port in its position. The catheter was then trimmed to  go to the fourth interspace at the level of the cavoatrial junction. It was  divided there.   We then passed the dilator and peel-away sheath over the wire and then  removed the wire and the dilator and passed the catheter through the peel-  away sheath and then again, C-arm fluoroscopy was used to confirm that the  tip of the catheter was in the superior vena cava at the level of the  cavoatrial junction and that there was no kinking in the system.   We then closed the incisions with 3-0 Vicryl subcuticular 4-0 Monocryl and  Steri-Strips.   I then accessed the system  with a Demetrios Isaacs point needle and aspirated and then  fully heparinized it. We then removed the needle.   Dressings were then applied and the patient transferred to the recovery room  in satisfactory condition having tolerated procedure well.      Rose Phi. Maple Hudson, M.D.  Electronically Signed     PRY/MEDQ  D:  04/25/2005  T:  04/25/2005  Job:  [id number]

## 2011-03-02 ENCOUNTER — Other Ambulatory Visit (HOSPITAL_COMMUNITY): Payer: Self-pay | Admitting: Oncology

## 2011-03-02 DIAGNOSIS — C50919 Malignant neoplasm of unspecified site of unspecified female breast: Secondary | ICD-10-CM

## 2011-04-04 ENCOUNTER — Other Ambulatory Visit (HOSPITAL_COMMUNITY): Payer: Self-pay | Admitting: Oncology

## 2011-04-04 ENCOUNTER — Ambulatory Visit (HOSPITAL_COMMUNITY)
Admission: RE | Admit: 2011-04-04 | Discharge: 2011-04-04 | Disposition: A | Discharge status: Home / Self Care | Payer: 59 | Source: Ambulatory Visit | Attending: Oncology | Admitting: Oncology

## 2011-04-04 ENCOUNTER — Encounter (HOSPITAL_COMMUNITY): Payer: 59

## 2011-04-04 DIAGNOSIS — C50919 Malignant neoplasm of unspecified site of unspecified female breast: Secondary | ICD-10-CM

## 2011-04-04 DIAGNOSIS — Z853 Personal history of malignant neoplasm of breast: Secondary | ICD-10-CM

## 2011-04-04 DIAGNOSIS — Z9889 Other specified postprocedural states: Secondary | ICD-10-CM

## 2011-04-23 ENCOUNTER — Ambulatory Visit (HOSPITAL_COMMUNITY): Payer: 59

## 2011-04-24 ENCOUNTER — Ambulatory Visit (HOSPITAL_COMMUNITY)
Admission: RE | Admit: 2011-04-24 | Discharge: 2011-04-24 | Disposition: A | Discharge status: Home / Self Care | Payer: 59 | Source: Ambulatory Visit | Attending: Oncology | Admitting: Oncology

## 2011-04-24 DIAGNOSIS — Z853 Personal history of malignant neoplasm of breast: Secondary | ICD-10-CM | POA: Insufficient documentation

## 2011-04-24 DIAGNOSIS — Z9889 Other specified postprocedural states: Secondary | ICD-10-CM

## 2011-04-24 DIAGNOSIS — Z9221 Personal history of antineoplastic chemotherapy: Secondary | ICD-10-CM | POA: Insufficient documentation

## 2011-04-24 DIAGNOSIS — Z923 Personal history of irradiation: Secondary | ICD-10-CM | POA: Insufficient documentation

## 2011-04-24 LAB — CREATININE, SERUM
Creatinine, Ser: 0.64 mg/dL (ref 0.50–1.10)
GFR calc Af Amer: >60 mL/min (ref >60)

## 2011-04-24 LAB — BUN: BUN: 12 mg/dL (ref 6–23)

## 2011-04-24 MED ORDER — GADOBENATE DIMEGLUMINE 529 MG/ML IV SOLN
20.0000 mL | Freq: Once | INTRAVENOUS | Status: AC | PRN
  Administered 2011-04-24: 20 mL via INTRAVENOUS

## 2011-05-28 LAB — COMPREHENSIVE METABOLIC PANEL
AST: 23
Albumin: 4.1
Chloride: 102
Creatinine, Ser: 0.75
GFR calc Af Amer: >60
Sodium: 136
Total Bilirubin: 0.7

## 2011-05-28 LAB — DIFFERENTIAL
Basophils Relative: 1
Lymphocytes Relative: 22
Monocytes Relative: 5

## 2011-05-28 LAB — CBC
MCV: 91.3
Platelets: 192
WBC: 6

## 2011-05-28 LAB — CANCER ANTIGEN 27.29: CA 27.29: 34

## 2011-06-05 LAB — CBC
Hemoglobin: 13.5
MCHC: 34
MCV: 90.5
RBC: 4.39

## 2011-06-15 LAB — COMPREHENSIVE METABOLIC PANEL
Albumin: 4
BUN: 7
Chloride: 103
Creatinine, Ser: 0.76
GFR calc non Af Amer: >60
Glucose, Bld: 152 — ABNORMAL HIGH
Total Bilirubin: 0.7

## 2011-06-15 LAB — CBC
HCT: 37.3
Hemoglobin: 12.7
MCV: 90
Platelets: 190
WBC: 5.8

## 2011-06-15 LAB — DIFFERENTIAL
Basophils Absolute: 0
Basophils Relative: 0
Lymphocytes Relative: 23
Neutro Abs: 4.1
Neutrophils Relative %: 71

## 2011-06-26 ENCOUNTER — Other Ambulatory Visit (HOSPITAL_COMMUNITY): Payer: Self-pay | Admitting: Oncology

## 2011-06-26 DIAGNOSIS — R234 Changes in skin texture: Secondary | ICD-10-CM

## 2011-06-27 ENCOUNTER — Ambulatory Visit (HOSPITAL_COMMUNITY)
Admission: RE | Admit: 2011-06-27 | Discharge: 2011-06-27 | Disposition: A | Discharge status: Home / Self Care | Payer: 59 | Source: Ambulatory Visit | Attending: Oncology | Admitting: Oncology

## 2011-06-27 DIAGNOSIS — R234 Changes in skin texture: Secondary | ICD-10-CM

## 2011-07-16 ENCOUNTER — Ambulatory Visit (HOSPITAL_COMMUNITY): Payer: 59 | Admitting: Oncology

## 2011-07-31 ENCOUNTER — Encounter (HOSPITAL_COMMUNITY): Payer: 59 | Attending: Oncology | Admitting: Oncology

## 2011-07-31 ENCOUNTER — Encounter (HOSPITAL_COMMUNITY): Payer: Self-pay | Admitting: Oncology

## 2011-07-31 VITALS — BP 135/81 | HR 92 | Temp 97.6°F | Ht 64.57 in | Wt 140.2 lb

## 2011-07-31 DIAGNOSIS — Z170 Estrogen receptor positive status [ER+]: Secondary | ICD-10-CM

## 2011-07-31 DIAGNOSIS — C50319 Malignant neoplasm of lower-inner quadrant of unspecified female breast: Secondary | ICD-10-CM

## 2011-07-31 DIAGNOSIS — C50919 Malignant neoplasm of unspecified site of unspecified female breast: Secondary | ICD-10-CM

## 2011-07-31 NOTE — Progress Notes (Signed)
This office note has been dictated.

## 2011-07-31 NOTE — Progress Notes (Signed)
CC:   [name] [name], M.D. [name] [name]. [name] [name], M.D.  DIAGNOSES: 1. 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  Her date of surgery was     03/02/2005 with her initial biopsy.  Her cancer was estrogen     receptor-positive 64%, progesterone receptor-positive 97% HER-2/neu     negative, Ki-67 marker high at 52%, and after chemotherapy she     received radiation therapy and then was placed on Femara, which she     did not tolerate well.  She is on tamoxifen and just finished 5     years of therapy.  She finished that May 2012. 2. Gastroesophageal reflux disease. 3. Right neck discomfort with what appears to be negative cervical     spine x-rays. 4. Mild but chronic anxiety. 5. Recent left breast drainage, not from the nipple but probably from     a skin cyst that is not apparent absolutely at this time.  If it     returns, she will come back to see [place]. 6. Sulfonamide allergy, which gives her a rash. 7. Chronic low back discomfort but still walking. 8. Benign right breast biopsy in 1993. [name] looks great, still working full-time, and the only symptom she has is of this fluid that she found on her bra, medially located underneath the breast, she states.  Saw Dr. Deboraha Sprang, who looked at her breast, did not find anything, and her recent mammogram and MRI, of course. Were negative for recurrent disease.  It was clear fluid, she states.  She thought she felt something at 1 point and could not re-document that well but put a little compress on it.  It seemed to go away completely for 2 weeks now and it is not back, so will see what we find in the future but I thought there was a possible pore in the medial portion of the lower inner breast area but I was not actually sure and I could not express anything for certain.  She has the reddish superficial  vascular rash from the radiation in the inframammary area, that is not new or different.  The inframammary fold skin changes are also there.  The breast is still lumpy-bumpy but without a distinctive mass.  The right breast is lumpy-bumpy without a mass.  She has no adenopathy in any location including cervical, supraclavicular, infraclavicular, axillary or inguinal areas.  Her lungs are clear to auscultation and percussion. Heart shows a regular rhythm and rate without murmur, rub or gallop. Abdomen is soft and nontender without organomegaly.  She has no peripheral edema.  The left breast also remains very tender to palpation, that is not new or different.  She, I think, is doing very well.  She looks good.  We will see her this time in a year since she is out 6-1/2 years essentially since her 1st biopsy.  She is out 6 months since completion of tamoxifen.  If she has any issues, she will let us know.  Otherwise, I will see her in 6 months.    ______________________________ Ladona Horns. Mariel Sleet, MD ESN/MEDQ  D:  07/31/2011  T:  07/31/2011  Job:  [id number]

## 2011-07-31 NOTE — Patient Instructions (Addendum)
[facility]  Discharge Instructions  EVALEIGH MCCAMY  [id number] 11-Oct-1950   RECOMMENDATIONS MADE BY THE CONSULTANT AND ANY TEST RESULTS WILL BE SENT TO YOUR REFERRING DOCTOR.   EXAM FINDINGS BY MD TODAY AND SIGNS AND SYMPTOMS TO REPORT TO CLINIC OR PRIMARY MD:  Exam per Dr. Mariel Sleet.  INSTRUCTIONS GIVEN AND DISCUSSED: The area that breast leaked is probably just skin issue. If it happens again come up and let us see it.  SPECIAL INSTRUCTIONS/FOLLOW-UP: 1 year  I acknowledge that I have been informed and understand all the instructions given to me and received a copy. I do not have any more questions at this time, but understand that I may call the Specialty Clinic at [facility] at [phone] during business hours should I have any further questions or need assistance in obtaining follow-up care.    __________________________________________  _____________  __________ Signature of Patient or Authorized Representative            Date                   Time    __________________________________________ Nurse's Signature

## 2011-08-01 ENCOUNTER — Encounter (INDEPENDENT_AMBULATORY_CARE_PROVIDER_SITE_OTHER): Payer: Self-pay | Admitting: Surgery

## 2011-08-14 ENCOUNTER — Encounter: Payer: Self-pay | Admitting: Gastroenterology

## 2011-08-15 ENCOUNTER — Ambulatory Visit: Payer: 59 | Admitting: Gastroenterology

## 2011-08-16 ENCOUNTER — Encounter: Payer: Self-pay | Admitting: Gastroenterology

## 2011-08-16 ENCOUNTER — Ambulatory Visit (INDEPENDENT_AMBULATORY_CARE_PROVIDER_SITE_OTHER): Payer: 59 | Admitting: Gastroenterology

## 2011-08-16 VITALS — BP 139/73 | HR 111 | Temp 97.2°F | Ht 64.0 in | Wt 139.2 lb

## 2011-08-16 DIAGNOSIS — K219 Gastro-esophageal reflux disease without esophagitis: Secondary | ICD-10-CM

## 2011-08-16 MED ORDER — ESOMEPRAZOLE MAGNESIUM 40 MG PO CPDR: DELAYED_RELEASE_CAPSULE | ORAL | Status: DC

## 2011-08-16 NOTE — Assessment & Plan Note (Signed)
Sx controlled.  Refill Nexium bid for 1 year. OPV IN 1 YEAR.

## 2011-08-16 NOTE — Progress Notes (Signed)
  Subjective:    Patient ID: [name] [name], female    DOB: 1951/02/12, 60 y.o.   MRN: [id number]  HPI Gets the hungry feeling from time to after getting a flare from something she ate. Everybody in her family has reflux. When she gets a flare she increase the dose to twice daily.  Past Medical History  Diagnosis Date  . Brain cancer   . Hypertension   . GERD (gastroesophageal reflux disease)     Past Surgical History  Procedure Date  . Esophagogastroduodenoscopy 2009    normal stomach,esophagues without mass,reosion  . Breast lumpectomy     Right breast  . Colonoscopy 2009    normal colon without evidence of polyps   Allergies  Allergen Reactions  . Sulfur     Current Outpatient Prescriptions  Medication Sig Dispense Refill  . Calcium Carb-Cholecalciferol (CALCIUM 1000 + D PO) Take 2 tablets by mouth 2 (two) times daily.        . enalapril (VASOTEC) 10 MG tablet Take 10 mg by mouth daily.        Marland Kitchen esomeprazole (NEXIUM) 40 MG capsule Take 40 mg by mouth daily.            Review of Systems     Objective:   Physical Exam  Vitals reviewed. Constitutional: She is oriented to person, place, and time. She appears well-developed and well-nourished. No distress.  HENT:  Head: Normocephalic and atraumatic.  Cardiovascular: Normal rate and regular rhythm.   Pulmonary/Chest: Breath sounds normal.  Abdominal: Soft. Bowel sounds are normal. She exhibits no distension. There is no tenderness.  Neurological: She is oriented to person, place, and time.       NO FOCAL DEFICITS           Assessment & Plan:

## 2011-08-24 ENCOUNTER — Encounter (HOSPITAL_COMMUNITY): Payer: Self-pay

## 2011-08-24 ENCOUNTER — Telehealth (HOSPITAL_COMMUNITY): Payer: Self-pay

## 2011-08-24 NOTE — Telephone Encounter (Signed)
Spoke with Ophie, states that "Dr. Sudie Bailey will be  following up with my blood work that was done on 12/18.  I had some labs done earlier and these were a follow-up. I will check with him next week if I don't hear back from him."

## 2011-09-10 ENCOUNTER — Telehealth (HOSPITAL_COMMUNITY): Payer: Self-pay

## 2011-09-10 NOTE — Telephone Encounter (Signed)
[name] [name] will come by clinic to get hemoccult cards tomorrow.

## 2011-09-10 NOTE — Telephone Encounter (Signed)
Message copied by Sterling Big on Mon Sep 10, 2011  6:10 PM ------      Message from: Mariel Sleet, ERIC S      Created: Mon Sep 10, 2011  5:26 PM       Needs H.O. X6 then GI consult.

## 2011-09-10 NOTE — Telephone Encounter (Signed)
Per [name], she has not seen Dr. Sudie Bailey since blood work so has not been placed on any iron, no stool for hemoccults done and no GI consults have made.

## 2011-09-11 ENCOUNTER — Other Ambulatory Visit: Payer: Self-pay | Admitting: Gastroenterology

## 2011-09-11 ENCOUNTER — Telehealth: Payer: Self-pay | Admitting: Gastroenterology

## 2011-09-11 DIAGNOSIS — D649 Anemia, unspecified: Secondary | ICD-10-CM

## 2011-09-11 NOTE — Telephone Encounter (Signed)
MOVI PREP SPLIT DOSING, REGULAR BREAKFAST. CLEAR LIQUIDS AFTER 9 AM ON jan 13. Npo after 8 AM ON JAN 14.   TCS/EGD JAN 14 DX: FEDA  PT WOULD LIKE INFO TO BE FAXED TO WORK. HER WORK # IS [phone]

## 2011-09-12 ENCOUNTER — Encounter (HOSPITAL_COMMUNITY): Payer: Self-pay

## 2011-09-12 NOTE — Progress Notes (Signed)
09/11/11  0830  Patient came to clinic and discussed lab results with Dr. Mariel Sleet.  Dr. Mariel Sleet to talk with Dr. Darrick Penna and Dr. Darrick Penna will contact patient.  Patient is to bring in collected hemoccult cards when collection is complete.

## 2011-09-14 ENCOUNTER — Encounter (HOSPITAL_COMMUNITY): Payer: 59 | Attending: Oncology

## 2011-09-14 DIAGNOSIS — D649 Anemia, unspecified: Secondary | ICD-10-CM | POA: Insufficient documentation

## 2011-09-14 LAB — OCCULT BLOOD X 1 CARD TO LAB, STOOL
Fecal Occult Bld: NEGATIVE
Fecal Occult Bld: NEGATIVE
Fecal Occult Bld: NEGATIVE

## 2011-09-14 MED ORDER — SODIUM CHLORIDE 0.45 % IV SOLN
Freq: Once | INTRAVENOUS | Status: AC
  Administered 2011-09-17: 12:00:00 via INTRAVENOUS

## 2011-09-14 NOTE — Progress Notes (Signed)
Patient brought 3 hemocults in today

## 2011-09-17 ENCOUNTER — Encounter (HOSPITAL_COMMUNITY)
Admission: RE | Disposition: A | Discharge status: Home / Self Care | Payer: Self-pay | Source: Ambulatory Visit | Attending: Gastroenterology

## 2011-09-17 ENCOUNTER — Other Ambulatory Visit: Payer: Self-pay | Admitting: Gastroenterology

## 2011-09-17 ENCOUNTER — Encounter (HOSPITAL_COMMUNITY): Payer: Self-pay | Admitting: *Deleted

## 2011-09-17 ENCOUNTER — Ambulatory Visit (HOSPITAL_COMMUNITY)
Admission: RE | Admit: 2011-09-17 | Discharge: 2011-09-17 | Disposition: A | Discharge status: Home / Self Care | Payer: 59 | Source: Ambulatory Visit | Attending: Gastroenterology | Admitting: Gastroenterology

## 2011-09-17 DIAGNOSIS — K2940 Chronic atrophic gastritis without bleeding: Secondary | ICD-10-CM | POA: Insufficient documentation

## 2011-09-17 DIAGNOSIS — Z79899 Other long term (current) drug therapy: Secondary | ICD-10-CM | POA: Insufficient documentation

## 2011-09-17 DIAGNOSIS — D649 Anemia, unspecified: Secondary | ICD-10-CM

## 2011-09-17 DIAGNOSIS — Z853 Personal history of malignant neoplasm of breast: Secondary | ICD-10-CM | POA: Insufficient documentation

## 2011-09-17 DIAGNOSIS — K2980 Duodenitis without bleeding: Secondary | ICD-10-CM | POA: Insufficient documentation

## 2011-09-17 DIAGNOSIS — D509 Iron deficiency anemia, unspecified: Secondary | ICD-10-CM | POA: Insufficient documentation

## 2011-09-17 DIAGNOSIS — K2990 Gastroduodenitis, unspecified, without bleeding: Secondary | ICD-10-CM

## 2011-09-17 DIAGNOSIS — I10 Essential (primary) hypertension: Secondary | ICD-10-CM | POA: Insufficient documentation

## 2011-09-17 DIAGNOSIS — K2970 Gastritis, unspecified, without bleeding: Secondary | ICD-10-CM

## 2011-09-17 DIAGNOSIS — K648 Other hemorrhoids: Secondary | ICD-10-CM

## 2011-09-17 HISTORY — PX: OTHER SURGICAL HISTORY: SHX169

## 2011-09-17 HISTORY — PX: ESOPHAGOGASTRODUODENOSCOPY: SHX1529

## 2011-09-17 HISTORY — DX: Malignant neoplasm of unspecified site of unspecified female breast: C50.919

## 2011-09-17 SURGERY — COLONOSCOPY WITH ESOPHAGOGASTRODUODENOSCOPY (EGD)
Anesthesia: Moderate Sedation

## 2011-09-17 MED ORDER — MEPERIDINE HCL 100 MG/ML IJ SOLN
INTRAMUSCULAR | Status: DC | PRN
  Administered 2011-09-17 (×3): 25 mg via INTRAVENOUS

## 2011-09-17 MED ORDER — MIDAZOLAM HCL 5 MG/5ML IJ SOLN
INTRAMUSCULAR | Status: AC
  Filled 2011-09-17: qty 10

## 2011-09-17 MED ORDER — SIMETHICONE 40 MG/0.6ML PO SUSP
ORAL | Status: DC | PRN
  Administered 2011-09-17: 13:00:00

## 2011-09-17 MED ORDER — MEPERIDINE HCL 100 MG/ML IJ SOLN
INTRAMUSCULAR | Status: AC
  Filled 2011-09-17: qty 2

## 2011-09-17 MED ORDER — MIDAZOLAM HCL 5 MG/5ML IJ SOLN
INTRAMUSCULAR | Status: DC | PRN
  Administered 2011-09-17 (×2): 1 mg via INTRAVENOUS
  Administered 2011-09-17 (×2): 2 mg via INTRAVENOUS

## 2011-09-17 MED ORDER — BUTAMBEN-TETRACAINE-BENZOCAINE 2-2-14 % EX AERO
INHALATION_SPRAY | CUTANEOUS | Status: DC | PRN
  Administered 2011-09-17: 2 via TOPICAL

## 2011-09-17 NOTE — H&P (Addendum)
Reason for Visit     Medication Refill        Vitals - Last Recorded       BP Pulse Temp(Src) Ht Wt BMI    139/73  111  97.2 F (36.2 C) (Temporal)  5\' 4"  (1.626 m)  139 lb 3.2 oz (63.141 kg)  23.89 kg/m2       Progress Notes     Jonette Eva, MD  08/16/2011  3:44 PM  Signed    Subjective:      Patient ID: [name] [name], female    DOB: 03-Nov-1950, 61 y.o.   MRN: [id number]   HPI Gets the hungry feeling from time to after getting a flare from something she ate. Everybody in her family has reflux. When she gets a flare she increase the dose to twice daily.    Past Medical History   Diagnosis  Date   .  Brain cancer     .  Hypertension     .  GERD (gastroesophageal reflux disease)         Past Surgical History   Procedure  Date   .  Esophagogastroduodenoscopy  2009       normal stomach,esophagues without mass,reosion   .  Breast lumpectomy         Right breast   .  Colonoscopy  2009       normal colon without evidence of polyps    Allergies   Allergen  Reactions   .  Sulfur         Current Outpatient Prescriptions   Medication  Sig  Dispense  Refill   .  Calcium Carb-Cholecalciferol (CALCIUM 1000 + D PO)  Take 2 tablets by mouth 2 (two) times daily.           .  enalapril (VASOTEC) 10 MG tablet  Take 10 mg by mouth daily.           Marland Kitchen  esomeprazole (NEXIUM) 40 MG capsule  Take 40 mg by mouth daily.                  Review of Systems     Objective:    Physical Exam  Vitals reviewed. Constitutional: She is oriented to person, place, and time. She appears well-developed and well-nourished. No distress.  HENT:   Head: Normocephalic and atraumatic.  Cardiovascular: Normal rate and regular rhythm.   Pulmonary/Chest: Breath sounds normal.  Abdominal: Soft. Bowel sounds are normal. She exhibits no distension. There is no tenderness.  Neurological: She is oriented to person, place, and time.       NO FOCAL DEFICITS  Assessment & Plan:             GERD - Jonette Eva, MD  08/16/2011  3:31 PM  Signed Sx controlled.   Refill Nexium bid for 1 year. OPV IN 1 YEAR.   Primary Care Physician:  Jonette Eva, MD, MD Primary Gastroenterologist:  Dr. Darrick Penna  Pre-Procedure History & Physical: HPI:  [name] [name] is a 61 y.o. female here for ANEMIA, IRON DEFICIENCY  Past Medical History  Diagnosis Date  . Hypertension   . GERD (gastroesophageal reflux disease)   . Breast cancer     Past Surgical History  Procedure Date  . Esophagogastroduodenoscopy 2009    normal stomach,esophagues without mass,reosion  . Colonoscopy 2009    normal colon without evidence of polyps  . Breast lumpectomy 2006    Left  breast  . Breast lumpectomy 1994    Right breast-benign    Prior to Admission medications   Medication Sig Start Date End Date Taking? Authorizing Provider  Calcium Carb-Cholecalciferol (CALCIUM 1000 + D PO) Take 2 tablets by mouth 2 (two) times daily.     Yes Historical Provider, MD  enalapril (VASOTEC) 10 MG tablet Take 10 mg by mouth daily.     Yes Historical Provider, MD  esomeprazole (NEXIUM) 40 MG capsule Take 40 mg by mouth 2 (two) times daily before a meal. 08/16/11  Yes Arlyce Harman, MD    Allergies as of 09/11/2011 - Review Complete 08/16/2011  Allergen Reaction Noted  . Sulfur      Family History  Problem Relation Age of Onset  . Anesthesia problems Neg Hx   . Hypotension Neg Hx   . Malignant hyperthermia Neg Hx   . Pseudochol deficiency Neg Hx     History   Social History  . Marital Status: Married    Spouse Name: N/A    Number of Children: N/A  . Years of Education: N/A   Occupational History  . Not on file.   Social History Main Topics  . Smoking status: Never Smoker   . Smokeless tobacco: Not on file  . Alcohol Use: No  . Drug Use: No  . Sexually Active:    Other Topics Concern  . Not on file   Social History Narrative  . No narrative on file    Review of Systems: See HPI,  otherwise negative ROS   Physical Exam: BP 139/87  Pulse 110  Temp(Src) 98 F (36.7 C) (Oral)  Resp 26  Ht 5\' 4"  (1.626 m)  Wt 140 lb (63.504 kg)  BMI 24.03 kg/m2  SpO2 100% General:   Alert,  pleasant and cooperative in NAD Head:  Normocephalic and atraumatic. Neck:  Supple; no masses or thyromegaly. Lungs:  Clear throughout to auscultation.    Heart:  Regular rate and rhythm. Abdomen:  Soft, nontender and nondistended. Normal bowel sounds, without guarding, and without rebound.   Neurologic:  Alert and  oriented x4;  grossly normal neurologically.  Impression/Plan:    Iron deficiency anemia  PLAN: TCS/?egd TODAY NEEDS DUODENAL & GASTRIC Bx

## 2011-09-17 NOTE — Op Note (Signed)
[facility] [address] [place], [place]  [address]  ENDOSCOPY PROCEDURE REPORT  PATIENT:  [name], [name]  MR#:  [id number] BIRTHDATE:  14-Sep-1950, 60 yrs. old  GENDER:  female  ENDOSCOPIST:  Jonette Eva, MD Referred by:  Glenford Peers, M.D. PCP: Sudie Bailey  PROCEDURE DATE:  09/17/2011 PROCEDURE:  EGD with biopsy, 19147 ASA CLASS: INDICATIONS:  ANEMIA-PT DOESN'T EAT MEAT EVERY DAY.  MEDICATIONS:   Demerol 25 mg IV, Versed 2 mg IV TOPICAL ANESTHETIC:  Cetacaine Spray  DESCRIPTION OF PROCEDURE:     Physical exam was performed. Informed consent was obtained from the patient after explaining the benefits, risks, and alternatives to the procedure.  The patient was connected to the monitor and placed in the left lateral position.  Continuous oxygen was provided by nasal cannula and IV medicine administered through an indwelling cannula.  After administration of sedation, the patient's esophagus was intubated and the EC-3890Li ([id number]) and EG-[id number] ([id number]) endoscope was advanced under direct visualization to the second portion of the duodenum.  The scope was removed slowly by carefully examining the color, texture, anatomy, and integrity of the mucosa on the way out.  The patient was recovered in endoscopy and discharged home in satisfactory condition. <<PROCEDUREIMAGES>>  Mild gastritis was found & BIOPSIED VIA COLD FORCEPS. NL ESOPHAGUS AND DUODENUM. BIOPSIES OBTAINED IN DUODENUM TO EVALUATE FOR CELIAC SPRUE.  COMPLICATIONS:    None  ENDOSCOPIC IMPRESSION: 1) Mild gastritis 2) NO SOURCE FOR IRON DEFICINECY ANEMIA IDENTIFIED  RECOMMENDATIONS: AWAIT BIOPSIES CONTINUE NEXIUM CAPSULE TO COMPLETE EVALUATION IF NO ATROPHIC GASTRITIS OR CELIAC SPRUE EAT MEAT DAILY  REPEAT EXAM:  No  ______________________________ Jonette Eva, MD  CC:  n. eSIGNED:   Sandi Fields at 09/17/2011 02:14 PM  [name] [name], [name], [phone]

## 2011-09-17 NOTE — Op Note (Signed)
[facility] [address] [place], [place]  [address]  COLONOSCOPY PROCEDURE REPORT  PATIENT:  [name] [name], [name] [name]  MR#:  [id number] BIRTHDATE:  23-Mar-1951, 60 yrs. old  GENDER:  female  ENDOSCOPIST:  Jonette Eva, MD REF. BY:  Glenford Peers, M.D. PCP: DR. Sudie Bailey ASSISTANT:  PROCEDURE DATE:  09/17/2011 PROCEDURE:  ILEOColonoscopy 84696  INDICATIONS:  ANEMIA  MEDICATIONS:   Demerol 50 mg IV, Versed 4 mg IV  DESCRIPTION OF PROCEDURE:    Physical exam was performed. Informed consent was obtained from the patient after explaining the benefits, risks, and alternatives to procedure.  The patient was connected to monitor and placed in left lateral position. Continuous oxygen was provided by nasal cannula and IV medicine administered through an indwelling cannula.  After administration of sedation and rectal exam, the patient's rectum was intubated and the EC-3890Li ([id number]) colonoscope was advanced under direct visualization to the ILEUM.  The scope was removed slowly by carefully examining the color, texture, anatomy, and integrity mucosa on the way out.  The patient was recovered in endoscopy and discharged home in satisfactory condition. <<PROCEDUREIMAGES>>  FINDINGS:  Normal colonoscopy without polyps, masses, vascular ectasias, or inflammatory changes. NORMAL ILEUM  20 CM VISUALIZED. SMALL INTERNAL HEMORRHOIDS.  PREP QUALITY: EXCELLENT CECAL W/D TIME:    10 minutes  COMPLICATIONS:    None  ENDOSCOPIC IMPRESSION: 1) Nl colonoscopy WMO 2) INTERNA; HEMORRHOIDS 3) NO SOURCE FOR IRON DEFICIENCY ANEMA IDENTIFIED  RECOMMENDATIONS: TCS IN 10 YEARS HIGH FIBER DIET PROCEED WITH EGD  REPEAT EXAM:  No  ______________________________ Jonette Eva, MD  CC:  Glenford Peers, M.D.  n. eSIGNED:   Sandi Fields at 09/17/2011 02:57 PM  [name] [name], [name] [name], [phone]

## 2011-09-20 ENCOUNTER — Other Ambulatory Visit: Payer: Self-pay | Admitting: Gastroenterology

## 2011-09-20 ENCOUNTER — Encounter: Payer: Self-pay | Admitting: Gastroenterology

## 2011-09-20 ENCOUNTER — Telehealth: Payer: Self-pay | Admitting: Gastroenterology

## 2011-09-20 DIAGNOSIS — D649 Anemia, unspecified: Secondary | ICD-10-CM

## 2011-09-20 NOTE — Telephone Encounter (Signed)
Pt scheduled for GIVENS on 01/23- Instructions faxed

## 2011-09-20 NOTE — Telephone Encounter (Signed)
Pt returned call and was informed.  

## 2011-09-20 NOTE — Telephone Encounter (Signed)
Results Cc to PCP  

## 2011-09-20 NOTE — Telephone Encounter (Signed)
SHE NEEDS A CAPSULE ENDOSCOPY WITHIN THE NEXT 1-2 WEEKS, DX: OBSCURE GI BLEED/IRON DEFICIENCY ANEMIA.

## 2011-09-20 NOTE — Telephone Encounter (Signed)
LM at work for pt to call.

## 2011-09-20 NOTE — Telephone Encounter (Signed)
Please call pt. Please call pt. HER stomach Bx shows mild gastritis. Continue NEXIUM 30 MINUTES PRIOR TO MEALS. Her small bowel biopsies SHOW MILD INFLAMMATION IN THE DUODENUM. NO OBVIOUS SOURCE FOR HER ANEMIA HAS BEEN IDENTIFIED. SHE NEEDS A CAPSULE ENDOSCOPY WITHIN THE NEXT 1-2 WEEKS.

## 2011-09-26 ENCOUNTER — Encounter (HOSPITAL_COMMUNITY): Payer: Self-pay | Admitting: *Deleted

## 2011-09-26 ENCOUNTER — Ambulatory Visit (HOSPITAL_COMMUNITY)
Admission: RE | Admit: 2011-09-26 | Discharge: 2011-09-26 | Disposition: A | Discharge status: Home / Self Care | Payer: 59 | Source: Ambulatory Visit | Attending: Gastroenterology | Admitting: Gastroenterology

## 2011-09-26 ENCOUNTER — Encounter (HOSPITAL_COMMUNITY)
Admission: RE | Disposition: A | Discharge status: Home / Self Care | Payer: Self-pay | Source: Ambulatory Visit | Attending: Gastroenterology

## 2011-09-26 DIAGNOSIS — D509 Iron deficiency anemia, unspecified: Secondary | ICD-10-CM

## 2011-09-26 HISTORY — PX: GIVENS CAPSULE STUDY: SHX5432

## 2011-09-26 SURGERY — IMAGING PROCEDURE, GI TRACT, INTRALUMINAL, VIA CAPSULE

## 2011-09-30 ENCOUNTER — Telehealth: Payer: Self-pay | Admitting: Gastroenterology

## 2011-09-30 NOTE — Brief Op Note (Signed)
09/26/2011  5:08 PM  PATIENT:  [name] [name]  61 y.o. female  PRE-OPERATIVE DIAGNOSIS:  Anemia-EGD/TCS JAN 2013-MILD DUODENITIS  POST-OPERATIVE DIAGNOSIS:  ANEMIA  PROCEDURE:  Procedure(s): GIVENS CAPSULE STUDY  SURGEON:  Surgeon(s): Arlyce Harman, MD  PATIENT DATA: WEIGHT 140 LBS, HEIGHT: 64 IN, WAIST: 33 IN, GASTRIC PASSAGE TIME: 1 HR 4 m, SB PASSAGE TIME: 1H 40m  RESULTS: LIMITED views of gastric mucosa due to retained contents. No blood in the stomach. SMALL BOWEL: No BLOOD, ULCERS, masses or AVMs. LIMITED VIEWS OF THE COLON DUE TO RETAINED CONTENTS.  DIAGNOSIS: ANEMIA-NO SOURCE FOR IRON DEFICIENCY IDENTIFIED.  Plan: 1. IV FE PRN. 2. OPV W/ SLF IN 1 YEAR. 3. REPEAT ENDOSCOPY IF SX OF ACTIVE GIB.

## 2011-09-30 NOTE — Telephone Encounter (Signed)
CALLED PT. HER CAPSULE ENDOSCOPY IS NL. ROUTINE RGA FOLLOW UP. IV FE WITH DR. Mariel Sleet PRN.

## 2011-10-01 ENCOUNTER — Telehealth (HOSPITAL_COMMUNITY): Payer: Self-pay

## 2011-10-01 DIAGNOSIS — E611 Iron deficiency: Secondary | ICD-10-CM

## 2011-10-01 NOTE — Telephone Encounter (Signed)
Patient notified and blood work scheduled for 4/30.  Questions the potential for constipation with taking the iron.  Encouraged to use stool softener if it occurs or to call us and let us know if it's a problem.  Verbalized understanding.

## 2011-10-01 NOTE — Telephone Encounter (Signed)
Message copied by Sterling Big on Mon Oct 01, 2011  5:50 PM ------      Message from: Mariel Sleet, ERIC S      Created: Mon Oct 01, 2011 10:20 AM       She OTC FeSO4 325 mg once a day for 3 mo then repeat cbc and iron studies incl. Ferritin      Her capsule endoscopy was also negative.

## 2011-10-01 NOTE — Telephone Encounter (Signed)
Message copied by Sterling Big on Mon Oct 01, 2011  5:56 PM ------      Message from: Mariel Sleet, ERIC S      Created: Mon Oct 01, 2011 10:20 AM       She OTC FeSO4 325 mg once a day for 3 mo then repeat cbc and iron studies incl. Ferritin      Her capsule endoscopy was also negative.

## 2011-10-03 ENCOUNTER — Encounter (HOSPITAL_COMMUNITY): Payer: Self-pay | Admitting: Gastroenterology

## 2011-12-26 ENCOUNTER — Other Ambulatory Visit (HOSPITAL_COMMUNITY): Payer: Self-pay | Admitting: Oncology

## 2011-12-26 DIAGNOSIS — C50919 Malignant neoplasm of unspecified site of unspecified female breast: Secondary | ICD-10-CM

## 2012-01-01 ENCOUNTER — Encounter (HOSPITAL_COMMUNITY): Payer: 59 | Attending: Oncology

## 2012-01-01 DIAGNOSIS — E611 Iron deficiency: Secondary | ICD-10-CM

## 2012-01-01 LAB — CBC
Hemoglobin: 13.7 g/dL (ref 12.0–15.0)
RBC: 4.82 MIL/uL (ref 3.87–5.11)

## 2012-01-01 LAB — IRON AND TIBC
Iron: 94 ug/dL (ref 42–135)
TIBC: 386 ug/dL (ref 250–470)
UIBC: 292 ug/dL (ref 125–400)

## 2012-01-01 LAB — FERRITIN: Ferritin: 19 ng/mL (ref 10–291)

## 2012-01-01 NOTE — Progress Notes (Signed)
Lab today.

## 2012-02-27 ENCOUNTER — Ambulatory Visit (HOSPITAL_COMMUNITY)
Admission: RE | Admit: 2012-02-27 | Discharge: 2012-02-27 | Disposition: A | Discharge status: Home / Self Care | Payer: 59 | Source: Ambulatory Visit | Attending: Oncology | Admitting: Oncology

## 2012-02-27 ENCOUNTER — Ambulatory Visit (HOSPITAL_COMMUNITY): Payer: 59

## 2012-02-27 DIAGNOSIS — Z9221 Personal history of antineoplastic chemotherapy: Secondary | ICD-10-CM | POA: Insufficient documentation

## 2012-02-27 DIAGNOSIS — C50919 Malignant neoplasm of unspecified site of unspecified female breast: Secondary | ICD-10-CM

## 2012-02-27 DIAGNOSIS — Z923 Personal history of irradiation: Secondary | ICD-10-CM | POA: Insufficient documentation

## 2012-02-27 DIAGNOSIS — Z9889 Other specified postprocedural states: Secondary | ICD-10-CM | POA: Insufficient documentation

## 2012-02-27 DIAGNOSIS — Z853 Personal history of malignant neoplasm of breast: Secondary | ICD-10-CM | POA: Insufficient documentation

## 2012-04-09 ENCOUNTER — Encounter (HOSPITAL_COMMUNITY): Payer: 59

## 2012-07-02 ENCOUNTER — Encounter (HOSPITAL_COMMUNITY): Payer: 59 | Attending: Oncology | Admitting: Oncology

## 2012-07-02 VITALS — BP 148/90 | HR 112 | Temp 97.8°F | Resp 18 | Wt 133.3 lb

## 2012-07-02 DIAGNOSIS — Z853 Personal history of malignant neoplasm of breast: Secondary | ICD-10-CM

## 2012-07-02 DIAGNOSIS — R071 Chest pain on breathing: Secondary | ICD-10-CM

## 2012-07-02 DIAGNOSIS — Z170 Estrogen receptor positive status [ER+]: Secondary | ICD-10-CM

## 2012-07-02 NOTE — Patient Instructions (Addendum)
[facility]  Discharge Instructions  RECOMMENDATIONS MADE BY THE CONSULTANT AND ANY TEST RESULTS WILL BE SENT TO YOUR REFERRING DOCTOR.  MEDICATIONS PRESCRIBED: Left arm sleeve for lymphedema  SPECIAL INSTRUCTIONS/FOLLOW-UP: Follow-up with Family Doctor, Lab work Needed as requested by your family doctor and Return to Clinic end of November or beginning of December as scheduled.  Please see the front desk today as you leave for your appointments.  Be sure to continue exercise and begin core exercise gradually.  Please call us with any concerns.      I acknowledge that I have been informed and understand all the instructions given to me and received a copy. I do not have any more questions at this time, but understand that I may call the Specialty Clinic at [facility] at [phone] during business hours should I have any further questions or need assistance in obtaining follow-up care.    __________________________________________  _____________  __________ Signature of Patient or Authorized Representative            Date                   Time    __________________________________________ Nurse's Signature

## 2012-07-02 NOTE — Progress Notes (Signed)
Problem #1 stage II (T1 C., N1 A., M0) grade 3 poorly differentiated ductal carcinoma left breast status post lumpectomy followed by TAC chemotherapy every 21 days x6 cycles. Her date of surgery was 03/02/2005 with her initial biopsy. Cancer was ER positive 64%, PR positive at 97% HER-2/neu negative. Ki-67 marker was high at 52%. She received chemotherapy followed by radiation therapy followed by letrozole which she did not tolerate well. She was switched to tamoxifen and finished 5 years of therapy in May of 2012. She is here for routine followup. She has no symptoms suggestive of recurrent disease. Recent mammography was negative.  Problem #2 GERD on therapy Problem #3 iron deficiency in the past and her primary care physician we'll check her iron studies soon GI workup showed mild gastritis only Problem #4 chronic left chest wall and breast discomfort secondary to surgery and radiation therapy Problem #5 sulfonamide allergy which gives her a rash Problem #6 chronic low back discomfort not new or different. Problem #7 benign right breast biopsy 1993. She is doing very well except for chronic pain in the left chest wall and breast. She has actually start doing sit-ups because of the discomfort. I want her to try to work through this and keep going with improving her core strength. Her bowels are working well though her iron gives her little bit of looseness to her bowels. I have asked her to take 3 a week. She needs to have her iron stores checked when Dr. Sudie Bailey does her blood work.  She looks great today vital signs are very stable. Lymph nodes are negative throughout including supraclavicular, cervical, infraclavicular, axillary, and inguinal areas. The left arm is minimally swollen. She does not have her sleeve on today. Her lungs are clear. The right breast is still fibroglandular. The left breast is still thickened more so than the right tender throughout including even the scan and there are  ectatic changes underneath the left breast which remain and the left breast is more adherent to the chest wall protruding more. Heart shows a regular rhythm rate 112 but she is always nervous. The pressure was ever so slightly high today. Abdomen soft nontender without hepatosplenomegaly or masses. She has no peripheral leg edema.  We will see her back in a year she is doing very very well

## 2012-07-08 NOTE — Progress Notes (Signed)
REVIEWED.  

## 2012-08-19 ENCOUNTER — Encounter: Payer: Self-pay | Admitting: Gastroenterology

## 2012-08-20 ENCOUNTER — Encounter: Payer: Self-pay | Admitting: Gastroenterology

## 2012-08-20 ENCOUNTER — Ambulatory Visit (INDEPENDENT_AMBULATORY_CARE_PROVIDER_SITE_OTHER): Payer: 59 | Admitting: Gastroenterology

## 2012-08-20 VITALS — BP 141/82 | HR 104 | Temp 98.4°F | Ht 64.0 in | Wt 130.8 lb

## 2012-08-20 DIAGNOSIS — D649 Anemia, unspecified: Secondary | ICD-10-CM | POA: Insufficient documentation

## 2012-08-20 DIAGNOSIS — K649 Unspecified hemorrhoids: Secondary | ICD-10-CM

## 2012-08-20 DIAGNOSIS — K219 Gastro-esophageal reflux disease without esophagitis: Secondary | ICD-10-CM

## 2012-08-20 MED ORDER — ESOMEPRAZOLE MAGNESIUM 40 MG PO CPDR: 40.0000 mg | DELAYED_RELEASE_CAPSULE | Freq: Two times a day (BID) | ORAL | Status: DC

## 2012-08-20 NOTE — Assessment & Plan Note (Signed)
ON IRON 3-4 TIMES A WEEK. NL HB DEC 2013.  IRON 2X/WEEK.  WILL OBTAIN LABS FROM DEC 2013. REPEAT CBC/FERRITIN APR 2014.  OPV ONE YEAR.

## 2012-08-20 NOTE — Assessment & Plan Note (Signed)
SX CONTROLLED.  CONTINUE NEXIUM. OPV IN ONE YEAR.

## 2012-08-20 NOTE — Progress Notes (Addendum)
  Subjective:    Patient ID: [name] [name], female    DOB: [birth year], 61 y.o.   MRN: [id number]  PCP: [facility]  HPI TAKES IRON-BUT NOT EVERY DAY. 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 MAKES HER BOWEL LOOSE. NEEDS NEXIUM ONCE OR TWICE OF DAY CONTROLLING HER SYMPTOMS. MAY BE EATING PRUNES AND CAUSING LOOSE STOOLS. LAST HAD LABS LAST WEEK AT [facility]. REPORTS THAT HB WAS NL(13). IRON MAKES STOOL BLACK BUT NO DARK TARRY STOOLS. NO VOMITING BLOOD OR BRBPR, ABD PAIN, NAUSEA,VOMITING, OR PROBLEMS SWALLOWING.   Past Medical History  Diagnosis Date  . Hypertension   . GERD (gastroesophageal reflux disease)   . Breast cancer    Past Surgical History  Procedure Date  . Esophagogastroduodenoscopy 2009    normal stomach,esophagues without mass,reosion  . Colonoscopy 2009    normal colon without evidence of polyps  . Breast lumpectomy 2006    Left  breast  . Breast lumpectomy 1994    Right breast-benign  . Givens capsule study 09/26/2011    Procedure: GIVENS CAPSULE STUDY;  Surgeon: Arlyce Harman, MD;  Location: [facility];  Service: Endoscopy;  Laterality: N/A;  . Ileocolonoscopy 09/17/2011    SLF: Nl colonoscopy WMO/INTERNA; HEMORRHOIDS/ NO SOURCE FOR IRON DEFICIENCY ANEMA IDENTIFIED  . Esophagogastroduodenoscopy 09/17/2011    SLF: Mild gastritis/NO SOURCE FOR IRON DEFICINECY ANEMIA IDENTIFIED   Allergies  Allergen Reactions  . Sulfur    Current Outpatient Prescriptions  Medication Sig Dispense Refill  . Calcium Carb-Cholecalciferol (CALCIUM 1000 + D PO) Take 2 tablets by mouth 2 (two) times daily.        . enalapril (VASOTEC) 10 MG tablet Take 10 mg by mouth daily.        Marland Kitchen esomeprazole (NEXIUM) 40 MG capsule Take 40 mg by mouth 2 (two) times daily before a meal.      . ferrous sulfate 325 (65 FE) MG tablet Take 325 mg by mouth every other day.           Review of Systems DEC 2013 HB 13.8, MCV 85.1 CR 0.67, NL HFP/PLT    Objective:   Physical Exam  Vitals reviewed. Constitutional: She is  oriented to person, place, and time. She appears well-nourished. No distress.  HENT:  Head: Normocephalic and atraumatic.  Mouth/Throat: Oropharynx is clear and moist. No oropharyngeal exudate.  Eyes: Pupils are equal, round, and reactive to light. No scleral icterus.  Neck: Normal range of motion. Neck supple.  Cardiovascular: Normal rate, regular rhythm and normal heart sounds.   Pulmonary/Chest: Effort normal and breath sounds normal. No respiratory distress.  Abdominal: Soft. Bowel sounds are normal. She exhibits no distension.  Neurological: She is alert and oriented to person, place, and time.       NO FOCAL DEFICITS    Psychiatric: She has a normal mood and affect.          Assessment & Plan:

## 2012-08-20 NOTE — Patient Instructions (Signed)
CONTINUE NEXIUM.  FOLLOW A LOW FAT/HIGH FIBER DIET. SEE ING BELOW.  YOU MAY CONTINUE IRON 2 TIMES A WEEK.   YOU NEED A CBC AND FERRITIN IN APR 2014.  FOLLOW UP IN ONE YEAR.  High-Fiber Diet A high-fiber diet changes your normal diet to include more whole grains, legumes, fruits, and vegetables. Changes in the diet involve replacing refined carbohydrates with unrefined foods. The calorie level of the diet is essentially unchanged. The Dietary Reference Intake (recommended amount) for adult males is 38 grams per day. For adult females, it is 25 grams per day. Pregnant and lactating women should consume 28 grams of fiber per day. Fiber is the intact part of a plant that is not broken down during digestion. Functional fiber is fiber that has been isolated from the plant to provide a beneficial effect in the body. PURPOSE  Increase stool bulk.   Ease and regulate bowel movements.   Lower cholesterol.  INDICATIONS THAT YOU NEED MORE FIBER  Constipation and hemorrhoids.   Uncomplicated diverticulosis (intestine condition) and irritable bowel syndrome.   Weight management.   As a protective measure against hardening of the arteries (atherosclerosis), diabetes, and cancer.   DO NOT USE WITH:  Acute diverticulitis (intestine infection).   Partial small bowel obstructions.   Complicated diverticular disease involving bleeding, rupture (perforation), or abscess (boil, furuncle).   Presence of autonomic neuropathy (nerve damage) or gastroparesis (stomach cannot empty itself).    GUIDELINES FOR INCREASING FIBER IN THE DIET  Start adding fiber to the diet slowly. A gradual increase of about 5 more grams (2 slices of whole-wheat bread, 2 servings of most fruits or vegetables, or 1 bowl of high-fiber cereal) per day is best. Too rapid an increase in fiber may result in constipation, flatulence, and bloating.   Drink enough water and fluids to keep your urine clear or pale yellow. Water,  juice, or caffeine-free drinks are recommended. Not drinking enough fluid may cause constipation.   Eat a variety of high-fiber foods rather than one type of fiber.   Try to increase your intake of fiber through using high-fiber foods rather than fiber pills or supplements that contain small amounts of fiber.   The goal is to change the types of food eaten. Do not supplement your present diet with high-fiber foods, but replace foods in your present diet.    INCLUDE A VARIETY OF FIBER SOURCES  Replace refined and processed grains with whole grains, canned fruits with fresh fruits, and incorporate other fiber sources. White rice, white breads, and most bakery goods contain little or no fiber.   Brown whole-grain rice, buckwheat oats, and many fruits and vegetables are all good sources of fiber. These include: broccoli, Brussels sprouts, cabbage, cauliflower, beets, sweet potatoes, white potatoes (skin on), carrots, tomatoes, eggplant, squash, berries, fresh fruits, and dried fruits.   Cereals appear to be the richest source of fiber. Cereal fiber is found in whole grains and bran. Bran is the fiber-rich outer coat of cereal grain, which is largely removed in refining. In whole-grain cereals, the bran remains. In breakfast cereals, the largest amount of fiber is found in those with "bran" in their names. The fiber content is sometimes indicated on the label.   You may need to include additional fruits and vegetables each day.   In baking, for 1 cup white flour, you may use the following substitutions:   1 cup whole-wheat flour minus 2 tablespoons.   1/2 cup white flour plus 1/2 cup  whole-wheat flour.   Low-Fat Diet BREADS, CEREALS, PASTA, RICE, DRIED PEAS, AND BEANS These products are high in carbohydrates and most are low in fat. Therefore, they can be increased in the diet as substitutes for fatty foods. They too, however, contain calories and should not be eaten in excess. Cereals can be  eaten for snacks as well as for breakfast.   FRUITS AND VEGETABLES It is good to eat fruits and vegetables. Besides being sources of fiber, both are rich in vitamins and some minerals. They help you get the daily allowances of these nutrients. Fruits and vegetables can be used for snacks and desserts.  MEATS Limit lean meat, chicken, [place], and fish to no more than 6 ounces per day. Beef, Pork, and Lamb Use lean cuts of beef, pork, and lamb. Lean cuts include:  Extra-lean ground beef.  Arm roast.  Sirloin tip.  Center-cut ham.  Round steak.  Loin chops.  Rump roast.  Tenderloin.  Trim all fat off the outside of meats before cooking. It is not necessary to severely decrease the intake of red meat, but lean choices should be made. Lean meat is rich in protein and contains a highly absorbable form of iron. Premenopausal women, in particular, should avoid reducing lean red meat because this could increase the risk for low red blood cells (iron-deficiency anemia).  Chicken and [place] These are good sources of protein. The fat of poultry can be reduced by removing the skin and underlying fat layers before cooking. Chicken and [place] can be substituted for lean red meat in the diet. Poultry should not be fried or covered with high-fat sauces. Fish and Shellfish Fish is a good source of protein. Shellfish contain cholesterol, but they usually are low in saturated fatty acids. The preparation of fish is important. Like chicken and [place], they should not be fried or covered with high-fat sauces. EGGS Egg whites contain no fat or cholesterol. They can be eaten often. Try 1 to 2 egg whites instead of whole eggs in recipes or use egg substitutes that do not contain yolk. MILK AND DAIRY PRODUCTS Use skim or 1% milk instead of 2% or whole milk. Decrease whole milk, natural, and processed cheeses. Use nonfat or low-fat (2%) cottage cheese or low-fat cheeses made from vegetable oils. Choose nonfat or  low-fat (1 to 2%) yogurt. Experiment with evaporated skim milk in recipes that call for heavy cream. Substitute low-fat yogurt or low-fat cottage cheese for sour cream in dips and salad dressings. Have at least 2 servings of low-fat dairy products, such as 2 glasses of skim (or 1%) milk each day to help get your daily calcium intake. FATS AND OILS Reduce the total intake of fats, especially saturated fat. Butterfat, lard, and beef fats are high in saturated fat and cholesterol. These should be avoided as much as possible. Vegetable fats do not contain cholesterol, but certain vegetable fats, such as coconut oil, palm oil, and palm kernel oil are very high in saturated fats. These should be limited. These fats are often used in bakery goods, processed foods, popcorn, oils, and nondairy creamers. Vegetable shortenings and some peanut butters contain hydrogenated oils, which are also saturated fats. Read the labels on these foods and check for saturated vegetable oils. Unsaturated vegetable oils and fats do not raise blood cholesterol. However, they should be limited because they are fats and are high in calories. Total fat should still be limited to 30% of your daily caloric intake. Desirable liquid vegetable oils  are corn oil, cottonseed oil, olive oil, canola oil, safflower oil, soybean oil, and sunflower oil. Peanut oil is not as good, but small amounts are acceptable. Buy a heart-healthy tub margarine that has no partially hydrogenated oils in the ingredients. Mayonnaise and salad dressings often are made from unsaturated fats, but they should also be limited because of their high calorie and fat content. Seeds, nuts, peanut butter, olives, and avocados are high in fat, but the fat is mainly the unsaturated type. These foods should be limited mainly to avoid excess calories and fat. OTHER EATING TIPS Snacks  Most sweets should be limited as snacks. They tend to be rich in calories and fats, and their  caloric content outweighs their nutritional value. Some good choices in snacks are graham crackers, melba toast, soda crackers, bagels (no egg), English muffins, fruits, and vegetables. These snacks are preferable to snack crackers, [place] fries, TORTILLA CHIPS, and POTATO chips. Popcorn should be air-popped or cooked in small amounts of liquid vegetable oil. Desserts Eat fruit, low-fat yogurt, and fruit ices instead of pastries, cake, and cookies. Sherbet, angel food cake, gelatin dessert, frozen low-fat yogurt, or other frozen products that do not contain saturated fat (pure fruit juice bars, frozen ice pops) are also acceptable.  COOKING METHODS Choose those methods that use little or no fat. They include: Poaching.  Braising.  Steaming.  Grilling.  Baking.  Stir-frying.  Broiling.  Microwaving.  Foods can be cooked in a nonstick pan without added fat, or use a nonfat cooking spray in regular cookware. Limit fried foods and avoid frying in saturated fat. Add moisture to lean meats by using water, broth, cooking wines, and other nonfat or low-fat sauces along with the cooking methods mentioned above. Soups and stews should be chilled after cooking. The fat that forms on top after a few hours in the refrigerator should be skimmed off. When preparing meals, avoid using excess salt. Salt can contribute to raising blood pressure in some people.  EATING AWAY FROM HOME Order entres, potatoes, and vegetables without sauces or butter. When meat exceeds the size of a deck of cards (3 to 4 ounces), the rest can be taken home for another meal. Choose vegetable or fruit salads and ask for low-calorie salad dressings to be served on the side. Use dressings sparingly. Limit high-fat toppings, such as bacon, crumbled eggs, cheese, sunflower seeds, and olives. Ask for heart-healthy tub margarine instead of butter.

## 2012-08-20 NOTE — Assessment & Plan Note (Signed)
RESOLVED. 

## 2012-08-20 NOTE — Progress Notes (Signed)
Faxed to PCP

## 2012-08-26 ENCOUNTER — Other Ambulatory Visit: Payer: Self-pay | Admitting: Gastroenterology

## 2012-08-26 DIAGNOSIS — D649 Anemia, unspecified: Secondary | ICD-10-CM

## 2012-09-01 NOTE — Progress Notes (Signed)
Reminder in epic to follow up in one year with SF in E30 

## 2012-09-08 ENCOUNTER — Other Ambulatory Visit: Payer: Self-pay

## 2012-09-08 MED ORDER — PANTOPRAZOLE SODIUM 40 MG PO TBEC: 40.0000 mg | DELAYED_RELEASE_TABLET | Freq: Two times a day (BID) | ORAL | Status: DC

## 2012-11-01 DIAGNOSIS — K30 Functional dyspepsia: Secondary | ICD-10-CM

## 2012-11-01 HISTORY — DX: Functional dyspepsia: K30

## 2012-11-12 ENCOUNTER — Encounter: Payer: Self-pay | Admitting: Gastroenterology

## 2012-11-12 ENCOUNTER — Ambulatory Visit (INDEPENDENT_AMBULATORY_CARE_PROVIDER_SITE_OTHER): Payer: 59 | Admitting: Gastroenterology

## 2012-11-12 VITALS — BP 127/80 | HR 99 | Temp 98.4°F | Ht 66.0 in | Wt 131.6 lb

## 2012-11-12 DIAGNOSIS — K3189 Other diseases of stomach and duodenum: Secondary | ICD-10-CM

## 2012-11-12 DIAGNOSIS — R079 Chest pain, unspecified: Secondary | ICD-10-CM

## 2012-11-12 DIAGNOSIS — R1013 Epigastric pain: Secondary | ICD-10-CM

## 2012-11-12 NOTE — Patient Instructions (Addendum)
COMPLETE CHEST XRAY.  UPPER ENDOSCOPY WITH BRAVO CAPSULE PLACEMENT ON MAR 25.  FOLLOW A LOW FAT DIET. SEE INFO BELOW.  TRY DEXILANT DAILY. STOP USING PROTONIX.  FOLLOW UP IN 2 MO.  Low-Fat Diet BREADS, CEREALS, PASTA, RICE, DRIED PEAS, AND BEANS These products are high in carbohydrates and most are low in fat. Therefore, they can be increased in the diet as substitutes for fatty foods. They too, however, contain calories and should not be eaten in excess. Cereals can be eaten for snacks as well as for breakfast.   FRUITS AND VEGETABLES It is good to eat fruits and vegetables. Besides being sources of fiber, both are rich in vitamins and some minerals. They help you get the daily allowances of these nutrients. Fruits and vegetables can be used for snacks and desserts.  MEATS Limit lean meat, chicken, [place], and fish to no more than 6 ounces per day. Beef, Pork, and Lamb Use lean cuts of beef, pork, and lamb. Lean cuts include:  Extra-lean ground beef.  Arm roast.  Sirloin tip.  Center-cut ham.  Round steak.  Loin chops.  Rump roast.  Tenderloin.  Trim all fat off the outside of meats before cooking. It is not necessary to severely decrease the intake of red meat, but lean choices should be made. Lean meat is rich in protein and contains a highly absorbable form of iron. Premenopausal women, in particular, should avoid reducing lean red meat because this could increase the risk for low red blood cells (iron-deficiency anemia).  Chicken and [place] These are good sources of protein. The fat of poultry can be reduced by removing the skin and underlying fat layers before cooking. Chicken and [place] can be substituted for lean red meat in the diet. Poultry should not be fried or covered with high-fat sauces. Fish and Shellfish Fish is a good source of protein. Shellfish contain cholesterol, but they usually are low in saturated fatty acids. The preparation of fish is important. Like  chicken and [place], they should not be fried or covered with high-fat sauces. EGGS Egg whites contain no fat or cholesterol. They can be eaten often. Try 1 to 2 egg whites instead of whole eggs in recipes or use egg substitutes that do not contain yolk. MILK AND DAIRY PRODUCTS Use skim or 1% milk instead of 2% or whole milk. Decrease whole milk, natural, and processed cheeses. Use nonfat or low-fat (2%) cottage cheese or low-fat cheeses made from vegetable oils. Choose nonfat or low-fat (1 to 2%) yogurt. Experiment with evaporated skim milk in recipes that call for heavy cream. Substitute low-fat yogurt or low-fat cottage cheese for sour cream in dips and salad dressings. Have at least 2 servings of low-fat dairy products, such as 2 glasses of skim (or 1%) milk each day to help get your daily calcium intake. FATS AND OILS Reduce the total intake of fats, especially saturated fat. Butterfat, lard, and beef fats are high in saturated fat and cholesterol. These should be avoided as much as possible. Vegetable fats do not contain cholesterol, but certain vegetable fats, such as coconut oil, palm oil, and palm kernel oil are very high in saturated fats. These should be limited. These fats are often used in bakery goods, processed foods, popcorn, oils, and nondairy creamers. Vegetable shortenings and some peanut butters contain hydrogenated oils, which are also saturated fats. Read the labels on these foods and check for saturated vegetable oils. Unsaturated vegetable oils and fats do not raise blood cholesterol.  However, they should be limited because they are fats and are high in calories. Total fat should still be limited to 30% of your daily caloric intake. Desirable liquid vegetable oils are corn oil, cottonseed oil, olive oil, canola oil, safflower oil, soybean oil, and sunflower oil. Peanut oil is not as good, but small amounts are acceptable. Buy a heart-healthy tub margarine that has no partially  hydrogenated oils in the ingredients. Mayonnaise and salad dressings often are made from unsaturated fats, but they should also be limited because of their high calorie and fat content. Seeds, nuts, peanut butter, olives, and avocados are high in fat, but the fat is mainly the unsaturated type. These foods should be limited mainly to avoid excess calories and fat. OTHER EATING TIPS Snacks  Most sweets should be limited as snacks. They tend to be rich in calories and fats, and their caloric content outweighs their nutritional value. Some good choices in snacks are graham crackers, melba toast, soda crackers, bagels (no egg), English muffins, fruits, and vegetables. These snacks are preferable to snack crackers, [place] fries, TORTILLA CHIPS, and POTATO chips. Popcorn should be air-popped or cooked in small amounts of liquid vegetable oil. Desserts Eat fruit, low-fat yogurt, and fruit ices instead of pastries, cake, and cookies. Sherbet, angel food cake, gelatin dessert, frozen low-fat yogurt, or other frozen products that do not contain saturated fat (pure fruit juice bars, frozen ice pops) are also acceptable.  COOKING METHODS Choose those methods that use little or no fat. They include: Poaching.  Braising.  Steaming.  Grilling.  Baking.  Stir-frying.  Broiling.  Microwaving.  Foods can be cooked in a nonstick pan without added fat, or use a nonfat cooking spray in regular cookware. Limit fried foods and avoid frying in saturated fat. Add moisture to lean meats by using water, broth, cooking wines, and other nonfat or low-fat sauces along with the cooking methods mentioned above. Soups and stews should be chilled after cooking. The fat that forms on top after a few hours in the refrigerator should be skimmed off. When preparing meals, avoid using excess salt. Salt can contribute to raising blood pressure in some people.  EATING AWAY FROM HOME Order entres, potatoes, and vegetables without  sauces or butter. When meat exceeds the size of a deck of cards (3 to 4 ounces), the rest can be taken home for another meal. Choose vegetable or fruit salads and ask for low-calorie salad dressings to be served on the side. Use dressings sparingly. Limit high-fat toppings, such as bacon, crumbled eggs, cheese, sunflower seeds, and olives. Ask for heart-healthy tub margarine instead of butter.

## 2012-11-12 NOTE — Assessment & Plan Note (Signed)
BURNING CHEST PAIN-? DUE TO ON CONTROLLED GERD ,LESS LIKELY METASTATIC BREAST CA, OR MEDIASTINAL LYMPHADENOPATHY.

## 2012-11-12 NOTE — Progress Notes (Signed)
  Subjective:    Patient ID: [name] [name], female    DOB: March 08, 1951, 62 y.o.   MRN: [id number]  PCP: Sudie Bailey   HPI STRONG FAMILY Hx: GERD(mother, siblings, children). HAS HAD BITTER TASTE IN HER MOUTH ALL THE TIME FOR PAST 6 WEEKS. IT'S LIKE A BITTER METALLIC TASTE. Has had burning IN HER CHEST FOR 4 WEEKS. FEELS LIQUIDS COMING BACK UP. CAN SIT UP IN HER RECLINER AND BECAUSE LIQUIDS MAKES HER THROAT SCRATCHY. LAST EGD JAN 2013. BEEN ON NEXIUM 1 OR 2 TIMES DAY FOR 6-7 YEARS. HAD VIRUS LAST WEEK WITH DIARRHEA. BMs: DAILY. APPETITE IS FINE BUT CAN'T EAT BECAUSE SHE GETS HEARTBURN.  PT DENIES FEVER, CHILLS, BRBPR, NAUSEA, vomiting, melena, diarrhea, constipation, abd pain, problems swallowing OR PAIN WITH SWALLOWING, problems with sedation.   Past Medical History  Diagnosis Date  . Hypertension   . GERD (gastroesophageal reflux disease)   . Breast cancer    Past Surgical History  Procedure Laterality Date  . Esophagogastroduodenoscopy  2009    normal stomach,esophagues without mass,reosion  . Colonoscopy  2009    normal colon without evidence of polyps  . Breast lumpectomy  2006    Left  breast  . Breast lumpectomy  1994    Right breast-benign  . Givens capsule study  09/26/2011    Procedure: GIVENS CAPSULE STUDY;  Surgeon: Arlyce Harman, MD;  Location: [facility];  Service: Endoscopy;  Laterality: N/A;  . Ileocolonoscopy  09/17/2011    SLF: Nl colonoscopy WMO/INTERNA; HEMORRHOIDS/ NO SOURCE FOR IRON DEFICIENCY ANEMA IDENTIFIED  . Esophagogastroduodenoscopy  09/17/2011    SLF: Mild gastritis/NO SOURCE FOR IRON DEFICINECY ANEMIA IDENTIFIED   Allergies  Allergen Reactions  . Sulfur     Current Outpatient Prescriptions  Medication Sig Dispense Refill  . Calcium Carb-Cholecalciferol (CALCIUM 1000 + D PO) Take 2 tablets by mouth 2 (two) times daily.        . enalapril (VASOTEC) 10 MG tablet Take 10 mg by mouth daily.        . ferrous sulfate 325 (65 FE) MG tablet Take 325 mg by  mouth every other day.      . pantoprazole (PROTONIX) 40 MG tablet Take 1 tablet (40 mg total) by mouth 2 (two) times daily.    .          Review of Systems     Objective:   Physical Exam  Vitals reviewed. Constitutional: She is oriented to person, place, and time. She appears well-nourished. No distress.  HENT:  Head: Normocephalic and atraumatic.  Mouth/Throat: Oropharynx is clear and moist. No oropharyngeal exudate.  Eyes: Pupils are equal, round, and reactive to light. No scleral icterus.  Neck: Normal range of motion. Neck supple.  Cardiovascular: Normal rate, regular rhythm and normal heart sounds.   Pulmonary/Chest: Effort normal and breath sounds normal. No respiratory distress.  Abdominal: Soft. Bowel sounds are normal. She exhibits no distension. There is no tenderness.  Musculoskeletal: She exhibits no edema.  SLEEVE ON LUE  Lymphadenopathy:    She has no cervical adenopathy.  Neurological: She is alert and oriented to person, place, and time.  NO FOCAL DEFICITS   Psychiatric: She has a normal mood and affect.          Assessment & Plan:

## 2012-11-12 NOTE — Progress Notes (Signed)
Faxed to PCP

## 2012-11-12 NOTE — Assessment & Plan Note (Signed)
NEW ONSET DYSPEPSIA IN A PT WITH KNOWN HX: GERD/BREAST CA. FAILED NEXIUM. PROTONIX BID NOT HELPING.  CHANGE TO DEXILANT. CHECK CXR LOW FAT DIET. HO GIVEN. EGD/BRAVO MAR 25. DISCUSSED EXAM AND REASON FOR STUDY. OPV IN 2 MOS

## 2012-11-13 ENCOUNTER — Other Ambulatory Visit: Payer: Self-pay | Admitting: Gastroenterology

## 2012-11-13 ENCOUNTER — Ambulatory Visit (HOSPITAL_COMMUNITY)
Admission: RE | Admit: 2012-11-13 | Discharge: 2012-11-13 | Disposition: A | Discharge status: Home / Self Care | Payer: 59 | Source: Ambulatory Visit | Attending: Gastroenterology | Admitting: Gastroenterology

## 2012-11-13 DIAGNOSIS — R911 Solitary pulmonary nodule: Secondary | ICD-10-CM

## 2012-11-13 DIAGNOSIS — R1013 Epigastric pain: Secondary | ICD-10-CM

## 2012-11-13 DIAGNOSIS — R079 Chest pain, unspecified: Secondary | ICD-10-CM | POA: Insufficient documentation

## 2012-11-13 DIAGNOSIS — Z853 Personal history of malignant neoplasm of breast: Secondary | ICD-10-CM | POA: Insufficient documentation

## 2012-11-13 MED ORDER — IOHEXOL 300 MG/ML  SOLN
80.0000 mL | Freq: Once | INTRAMUSCULAR | Status: AC | PRN
  Administered 2012-11-13: 80 mL via INTRAVENOUS

## 2012-11-13 NOTE — Progress Notes (Signed)
CALLED BY DR. Annetta Maw IN [facility]. RECCOMMENDED CT SCAN. CHEST CT: SHOWED SCARRING IN [facility]. REPEAT CT CHEST W/O CONTRAST IN 3 MOS. EGD/BRAVO MAR 25.

## 2012-11-13 NOTE — Progress Notes (Signed)
Noted, CT nicd in the recalls

## 2012-11-17 ENCOUNTER — Other Ambulatory Visit: Payer: Self-pay

## 2012-11-17 ENCOUNTER — Other Ambulatory Visit: Payer: Self-pay | Admitting: Gastroenterology

## 2012-11-17 DIAGNOSIS — D649 Anemia, unspecified: Secondary | ICD-10-CM

## 2012-11-21 NOTE — Progress Notes (Signed)
Reminder in epic °

## 2012-11-26 ENCOUNTER — Telehealth: Payer: Self-pay | Admitting: Gastroenterology

## 2012-11-26 NOTE — Telephone Encounter (Signed)
Pt called and said she is much better since she started taking the Dexilant. She has a little heart burn occasionally. But she has not had the indigestion like before. She would like to discuss with Dr. Darrick Penna before her EGD is done. Said she wants to do the EGD, but she would like to discuss with Dr. Evelina Dun the necessity of the Bravo since she is much better taking the Dexilant. Please call pt.

## 2012-11-26 NOTE — Telephone Encounter (Signed)
Patient is doing well on Dexilant and would like either more samples or either a Rx called to the pharmacy. She also has questions for Dr. Darrick Penna regarding the [facility] before she has it done.

## 2012-11-27 ENCOUNTER — Other Ambulatory Visit: Payer: Self-pay | Admitting: Gastroenterology

## 2012-11-27 DIAGNOSIS — R1013 Epigastric pain: Secondary | ICD-10-CM

## 2012-11-27 MED ORDER — DEXLANSOPRAZOLE 60 MG PO CPDR: DELAYED_RELEASE_CAPSULE | ORAL | Status: DC

## 2012-11-27 NOTE — Telephone Encounter (Signed)
LMOM that Rx has been sent.  

## 2012-11-27 NOTE — Addendum Note (Signed)
Addended by: [place] on: 11/27/2012 02:01 PM   Modules accepted: Orders, Medications

## 2012-11-27 NOTE — Telephone Encounter (Addendum)
SPOKE WITH PT THIS AM. CANCEL BRAVO. WILL PLAN TO DO EGD FOR DYSPEPSIA AND CONSIDER BACLOFEN OR TCS/SSRI FOR CONTINUED SX.  PLEASE CALL PT. RX SENT.

## 2012-11-27 NOTE — Telephone Encounter (Signed)
Pt called back and asked me to fax the savings discount for the Dexilant to [facility] pharmacy. She also said to tell Dr. Darrick Penna that after she talked to her, she started wondering if she does not have the Bravo at time of EGD, does that mean she will not do Bravo at all. She is thinking that if it is a possibility that she will need it, she might should go on with it so she will not have to have another EGD. Please advise so [name] [name] will know. She is tentatively scheduled for 12/02/2012. Please advise!

## 2012-11-27 NOTE — Telephone Encounter (Signed)
Per pharmacist, the coupon would not be legible, and pt also has to activate it first. I called and told pt. Also, it expires on 12/01/2012. Pt already has one that I had put with her samples. She said she does not need another one yet. She has enough samples and will not need Rx until after procedure. She wants to wait and get results from that.

## 2012-11-27 NOTE — Telephone Encounter (Signed)
Done

## 2012-11-30 NOTE — Telephone Encounter (Signed)
CALLED PT. STILL HAVING HEARTBURN. FEELS DEXILANT NOT WORKING 100%. PT CHANGED HER MIND AND WOULD LIKE BRAVO AS LONG AS INSURANCE CO WILL PAY FOR IT. PT SCHEDULE EGD ON TUES. SHE WILL CALL [facility] ON MAR 31 TO DISCUSS HOW MUCH SHE MAY HAVE TO PAY OUT OF POCKET. SHE WILL CALL LEIGH ANN AND LET HER KNOW. PRE-CERT PT FOR EGD/DIL/BRAVO STUDY-DX: DYSPHAGIA, UNCONTROLLED GERD

## 2012-12-01 ENCOUNTER — Encounter (HOSPITAL_COMMUNITY): Payer: Self-pay | Admitting: Pharmacy Technician

## 2012-12-01 ENCOUNTER — Telehealth: Payer: Self-pay | Admitting: Gastroenterology

## 2012-12-01 NOTE — Telephone Encounter (Signed)
Pt called for LAW and said she has procedure scheduled with [facility] and wants to change that. Patient said to call her at [phone]

## 2012-12-01 NOTE — Telephone Encounter (Signed)
Spoke to patient and its taken care of

## 2012-12-02 ENCOUNTER — Ambulatory Visit (HOSPITAL_COMMUNITY)
Admission: RE | Admit: 2012-12-02 | Discharge: 2012-12-02 | Disposition: A | Discharge status: Home / Self Care | Payer: 59 | Source: Ambulatory Visit | Attending: Gastroenterology | Admitting: Gastroenterology

## 2012-12-02 ENCOUNTER — Encounter (HOSPITAL_COMMUNITY)
Admission: RE | Disposition: A | Discharge status: Home / Self Care | Payer: Self-pay | Source: Ambulatory Visit | Attending: Gastroenterology

## 2012-12-02 ENCOUNTER — Encounter (HOSPITAL_COMMUNITY): Payer: Self-pay | Admitting: *Deleted

## 2012-12-02 DIAGNOSIS — I10 Essential (primary) hypertension: Secondary | ICD-10-CM | POA: Insufficient documentation

## 2012-12-02 DIAGNOSIS — R1013 Epigastric pain: Secondary | ICD-10-CM

## 2012-12-02 DIAGNOSIS — R1310 Dysphagia, unspecified: Secondary | ICD-10-CM

## 2012-12-02 DIAGNOSIS — K2970 Gastritis, unspecified, without bleeding: Secondary | ICD-10-CM

## 2012-12-02 DIAGNOSIS — K3189 Other diseases of stomach and duodenum: Secondary | ICD-10-CM | POA: Insufficient documentation

## 2012-12-02 DIAGNOSIS — K2990 Gastroduodenitis, unspecified, without bleeding: Secondary | ICD-10-CM

## 2012-12-02 DIAGNOSIS — K2940 Chronic atrophic gastritis without bleeding: Secondary | ICD-10-CM | POA: Insufficient documentation

## 2012-12-02 HISTORY — PX: ESOPHAGOGASTRODUODENOSCOPY: SHX5428

## 2012-12-02 SURGERY — EGD (ESOPHAGOGASTRODUODENOSCOPY)
Anesthesia: Moderate Sedation

## 2012-12-02 MED ORDER — MIDAZOLAM HCL 5 MG/5ML IJ SOLN
INTRAMUSCULAR | Status: DC | PRN
  Administered 2012-12-02 (×2): 2 mg via INTRAVENOUS

## 2012-12-02 MED ORDER — MIDAZOLAM HCL 5 MG/5ML IJ SOLN
INTRAMUSCULAR | Status: AC
  Filled 2012-12-02: qty 10

## 2012-12-02 MED ORDER — MEPERIDINE HCL 100 MG/ML IJ SOLN
INTRAMUSCULAR | Status: DC | PRN
  Administered 2012-12-02 (×2): 25 mg via INTRAVENOUS

## 2012-12-02 MED ORDER — BUTAMBEN-TETRACAINE-BENZOCAINE 2-2-14 % EX AERO
INHALATION_SPRAY | CUTANEOUS | Status: DC | PRN
  Administered 2012-12-02: 2 via TOPICAL

## 2012-12-02 MED ORDER — SODIUM CHLORIDE 0.9 % IV SOLN
INTRAVENOUS | Status: DC
  Administered 2012-12-02: 1000 mL via INTRAVENOUS

## 2012-12-02 MED ORDER — MEPERIDINE HCL 100 MG/ML IJ SOLN
INTRAMUSCULAR | Status: AC
  Filled 2012-12-02: qty 1

## 2012-12-02 NOTE — Op Note (Signed)
[facility] [address] [place], [address]   ENDOSCOPY PROCEDURE REPORT  PATIENT: [name], [name]  MR#: [id number] BIRTHDATE: 22-May-1951 , 61  yrs. old GENDER: Female  ENDOSCOPIST: Jonette Eva, MD REFFERED XB:JYNWG Sudie Bailey, M.D.  PROCEDURE DATE:  12/02/2012 PROCEDURE:   EGD with biopsy for H.  pylori and dilatation over guidewire  INDICATIONS:1.  dyspepsia.   2.  dysphagia. MEDICATIONS: Demerol 50 mg IV and Versed 4 mg IV TOPICAL ANESTHETIC: Cetacaine Spray  DESCRIPTION OF PROCEDURE:   After the risks benefits and alternatives of the procedure were thoroughly explained, informed consent was obtained.  The EG-[id number] ([id number])  endoscope was introduced through the mouth and advanced to the second portion of the duodenum. The instrument was slowly withdrawn as the mucosa was carefully examined.  Prior to withdrawal of the scope, the guidwire was placed.  The esophagus was dilated successfully.  The patient was recovered in endoscopy and discharged home in satisfactory condition.   ESOPHAGUS: The mucosa of the esophagus appeared normal.  Multiple biopsies were performed  20 cm and 35 cm  FROM THE TEETH.  GE JXN 40 CM FROM THE TEETH. STOMACH: Mild non-erosive gastritis (inflammation) was found in the gastric antrum.  Multiple biopsies were performed.   DUODENUM: The duodenal mucosa showed no abnormalities in the bulb and second portion of the duodenum.  Dilation was then performed at the gastroesphageal junction Dilator: Savary over guidewire Size(s): 15-16 mm Resistance: minimal, BUT PRESENT, Heme: yes dilation after esophageal biopsies  COMPLICATIONS: There were no complications.  ENDOSCOPIC IMPRESSION: 1.   NO SOURCE FOR DYSPEPSIA IDENTFIED 2.   MILD Non-erosive gastritis   RECOMMENDATIONS: PT DECLINED BARVO. CONTINUE DEXILANT. CONSIDER ADDING TCA/SSR TO TREAT NON-ULCER DYSPEPSIA IF ESO BX NL. AVOID REFLUX TRIGGERS. LOW FAT DIET. BIOPSY WILL BE  BACK IN 7 DAYS. FOLLOW UP IN MAY 2014.      _______________________________ eSignedJonette Eva, MD 12/02/2012 10:37 AM      PATIENT NAME:  [name] [name] MR#: [id number]

## 2012-12-02 NOTE — H&P (View-Only) (Signed)
  Subjective:    Patient ID: [name] [name], female    DOB: 1951/06/30, 62 y.o.   MRN: [id number]  PCP: Sudie Bailey   HPI STRONG FAMILY Hx: GERD(mother, siblings, children). HAS HAD BITTER TASTE IN HER MOUTH ALL THE TIME FOR PAST 6 WEEKS. IT'S LIKE A BITTER METALLIC TASTE. Has had burning IN HER CHEST FOR 4 WEEKS. FEELS LIQUIDS COMING BACK UP. CAN SIT UP IN HER RECLINER AND BECAUSE LIQUIDS MAKES HER THROAT SCRATCHY. LAST EGD JAN 2013. BEEN ON NEXIUM 1 OR 2 TIMES DAY FOR 6-7 YEARS. HAD VIRUS LAST WEEK WITH DIARRHEA. BMs: DAILY. APPETITE IS FINE BUT CAN'T EAT BECAUSE SHE GETS HEARTBURN.  PT DENIES FEVER, CHILLS, BRBPR, NAUSEA, vomiting, melena, diarrhea, constipation, abd pain, problems swallowing OR PAIN WITH SWALLOWING, problems with sedation.   Past Medical History  Diagnosis Date  . Hypertension   . GERD (gastroesophageal reflux disease)   . Breast cancer    Past Surgical History  Procedure Laterality Date  . Esophagogastroduodenoscopy  2009    normal stomach,esophagues without mass,reosion  . Colonoscopy  2009    normal colon without evidence of polyps  . Breast lumpectomy  2006    Left  breast  . Breast lumpectomy  1994    Right breast-benign  . Givens capsule study  09/26/2011    Procedure: GIVENS CAPSULE STUDY;  Surgeon: Arlyce Harman, MD;  Location: [facility];  Service: Endoscopy;  Laterality: N/A;  . Ileocolonoscopy  09/17/2011    SLF: Nl colonoscopy WMO/INTERNA; HEMORRHOIDS/ NO SOURCE FOR IRON DEFICIENCY ANEMA IDENTIFIED  . Esophagogastroduodenoscopy  09/17/2011    SLF: Mild gastritis/NO SOURCE FOR IRON DEFICINECY ANEMIA IDENTIFIED   Allergies  Allergen Reactions  . Sulfur     Current Outpatient Prescriptions  Medication Sig Dispense Refill  . Calcium Carb-Cholecalciferol (CALCIUM 1000 + D PO) Take 2 tablets by mouth 2 (two) times daily.        . enalapril (VASOTEC) 10 MG tablet Take 10 mg by mouth daily.        . ferrous sulfate 325 (65 FE) MG tablet Take 325 mg by  mouth every other day.      . pantoprazole (PROTONIX) 40 MG tablet Take 1 tablet (40 mg total) by mouth 2 (two) times daily.    .          Review of Systems     Objective:   Physical Exam  Vitals reviewed. Constitutional: She is oriented to person, place, and time. She appears well-nourished. No distress.  HENT:  Head: Normocephalic and atraumatic.  Mouth/Throat: Oropharynx is clear and moist. No oropharyngeal exudate.  Eyes: Pupils are equal, round, and reactive to light. No scleral icterus.  Neck: Normal range of motion. Neck supple.  Cardiovascular: Normal rate, regular rhythm and normal heart sounds.   Pulmonary/Chest: Effort normal and breath sounds normal. No respiratory distress.  Abdominal: Soft. Bowel sounds are normal. She exhibits no distension. There is no tenderness.  Musculoskeletal: She exhibits no edema.  SLEEVE ON LUE  Lymphadenopathy:    She has no cervical adenopathy.  Neurological: She is alert and oriented to person, place, and time.  NO FOCAL DEFICITS   Psychiatric: She has a normal mood and affect.          Assessment & Plan:

## 2012-12-02 NOTE — Interval H&P Note (Signed)
History and Physical Interval Note:  12/02/2012 8:11 AM  [name] [name]  has presented today for surgery, with the diagnosis of Dyspepsia  The various methods of treatment have been discussed with the patient and family. After consideration of risks, benefits and other options for treatment, the patient has consented to  Procedure(s) with comments: ESOPHAGOGASTRODUODENOSCOPY (EGD) (N/A) - 8:30-moved to [address] notified pt  as a surgical intervention .  The patient's history has been reviewed, patient examined, no change in status, stable for surgery.  I have reviewed the patient's chart and labs.  Questions were answered to the patient's satisfaction.     [name]

## 2012-12-08 ENCOUNTER — Encounter (HOSPITAL_COMMUNITY): Payer: Self-pay | Admitting: Gastroenterology

## 2012-12-09 ENCOUNTER — Telehealth: Payer: Self-pay | Admitting: Gastroenterology

## 2012-12-09 NOTE — Telephone Encounter (Signed)
Cc PCP 

## 2012-12-09 NOTE — Telephone Encounter (Signed)
Please call pt. HER stomach Bx shows gastritis. THE ESOPHAGUS BIOPSIES ARE NORMAL.   CONTINUE DEXILANT.  FOLLOW A LOW FAT DIET.   AVOID FOOD THAT TRIGGERS REFLUX.  FOLLOW UP IN MAY 2014.

## 2012-12-10 NOTE — Telephone Encounter (Signed)
L/M to call.

## 2012-12-10 NOTE — Telephone Encounter (Signed)
Pt returned call and was informed. Low fat diet mailed to her.

## 2012-12-10 NOTE — Telephone Encounter (Signed)
PLEASE CALL PT. IF HER REFLUX IS NOT IDEALLY CONTROLLED SHE WILL FEEL LIKE SOMETHING IS NOT RIGHT WITH HER ESOPHAGUS. SHE SHOULD CONTINUE THE DEXILANT AND STRICTLY FOLLOW A LOW FAT DIET. IF THE SENSATION DOES NOT GO AWAY IN ONE MO, SHE SHOULD CALL AND SHE WILL NEED A BARIUM PILL ESOPHAGRAM.

## 2012-12-10 NOTE — Telephone Encounter (Signed)
Pt returned call and was informed of results. She said she is much better. She said she still feels a little like something is stuck in her esophagus. She wants to know if Dr. Darrick Penna thinks that the Dexilant will get rid of that feeling after she has been on it for awhile longer. She takes it in the evening since night is when she usually had the most problems. She no longer has the bitter taste and is so glad she is much better. Please advise on the feeling in esophagus.

## 2013-01-07 ENCOUNTER — Other Ambulatory Visit: Payer: Self-pay

## 2013-01-07 DIAGNOSIS — Z1231 Encounter for screening mammogram for malignant neoplasm of breast: Secondary | ICD-10-CM

## 2013-01-07 DIAGNOSIS — Z853 Personal history of malignant neoplasm of breast: Secondary | ICD-10-CM

## 2013-02-17 ENCOUNTER — Encounter: Payer: Self-pay | Admitting: Gastroenterology

## 2013-03-02 ENCOUNTER — Telehealth: Payer: Self-pay | Admitting: Gastroenterology

## 2013-03-02 MED ORDER — PANTOPRAZOLE SODIUM 40 MG PO TBEC: DELAYED_RELEASE_TABLET | ORAL | Status: DC

## 2013-03-02 NOTE — Addendum Note (Signed)
Addended by: [place] on: 03/02/2013 09:26 AM   Modules accepted: Orders, Medications

## 2013-03-02 NOTE — Telephone Encounter (Signed)
I called pt and she had been switched to Protonix 40 mg bid. Please send Rx refill to [facility] pharmacy.

## 2013-03-02 NOTE — Telephone Encounter (Signed)
PLEASE CALL PT.  Rx sent.  

## 2013-03-02 NOTE — Telephone Encounter (Signed)
Patient needs Refill on Protonix write the refills until the end of the year until she has her return visit

## 2013-03-02 NOTE — Telephone Encounter (Signed)
Called and left message for pt that Rx has been called in.

## 2013-03-12 ENCOUNTER — Ambulatory Visit
Admission: RE | Admit: 2013-03-12 | Discharge: 2013-03-12 | Disposition: A | Discharge status: Home / Self Care | Payer: 59 | Source: Ambulatory Visit

## 2013-03-12 DIAGNOSIS — Z1231 Encounter for screening mammogram for malignant neoplasm of breast: Secondary | ICD-10-CM

## 2013-03-12 DIAGNOSIS — Z853 Personal history of malignant neoplasm of breast: Secondary | ICD-10-CM

## 2013-03-23 ENCOUNTER — Ambulatory Visit (HOSPITAL_COMMUNITY): Payer: 59

## 2013-03-23 ENCOUNTER — Encounter (HOSPITAL_COMMUNITY): Payer: 59 | Attending: Oncology | Admitting: Oncology

## 2013-03-23 VITALS — BP 148/90 | HR 118 | Temp 97.4°F | Resp 20

## 2013-03-23 DIAGNOSIS — R9389 Abnormal findings on diagnostic imaging of other specified body structures: Secondary | ICD-10-CM

## 2013-03-23 NOTE — Patient Instructions (Addendum)
[facility] Discharge Instructions  RECOMMENDATIONS MADE BY THE CONSULTANT AND ANY TEST RESULTS WILL BE SENT TO YOUR REFERRING PHYSICIAN.  EXAM FINDINGS BY THE PHYSICIAN TODAY AND SIGNS OR SYMPTOMS TO REPORT TO CLINIC OR PRIMARY PHYSICIAN: Exam and discussion by [name] Anes, PA-C.  Don't want you to massage this area for now.  Leave it alone for next 2 weeks and we will re-evaluate it.  MEDICATIONS PRESCRIBED:  none  INSTRUCTIONS GIVEN AND DISCUSSED: Let us know if area gets worse, you have nipple discharge or other problems.   We will schedule a repeat CT of your chest  In September.  SPECIAL INSTRUCTIONS/FOLLOW-UP: Follow-up in 2 weeks.  If area resolves, you can cancel the appointmentm.  Thank you for choosing [facility] to provide your oncology and hematology care.  To afford each patient quality time with our providers, please arrive at least 15 minutes before your scheduled appointment time.  With your help, our goal is to use those 15 minutes to complete the necessary work-up to ensure our physicians have the information they need to help with your evaluation and healthcare recommendations.    Effective January 1st, 2014, we ask that you re-schedule your appointment with our physicians should you arrive 10 or more minutes late for your appointment.  We strive to give you quality time with our providers, and arriving late affects you and other patients whose appointments are after yours.    Again, thank you for choosing [facility].  Our hope is that these requests will decrease the amount of time that you wait before being seen by our physicians.       _____________________________________________________________  Should you have questions after your visit to [facility], please contact our office at [phone] between the hours of 8:30 a.m. and 5:00 p.m.  Voicemails left after 4:30 p.m. will not be returned until the  following business day.  For prescription refill requests, have your pharmacy contact our office with your prescription refill request.

## 2013-03-23 NOTE — Progress Notes (Signed)
Left arm/lateral aspect of breast, and left breast stays sore all the time. However, medial aspect of breast became reddened last pm. No tenderness to that area. No nipple discharge.

## 2013-03-23 NOTE — Progress Notes (Signed)
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 Her date of surgery was 03/02/2005 with her initial biopsy. Cancer was ER positive 64%, PR positive at 97% HER-2/neu negative. Ki-67 marker was high at 52%. She received chemotherapy followed by radiation therapy followed by letrozole which she did not tolerate well. She was switched to tamoxifen and finished 5 years of therapy in May of 2012.  Recent mammography on 03/17/2013 was negative and BIRADS Cat 1.   She reports that she noticed some erythema of her left breast yesterday. She denies any pain or tenderness at abnormal for her. She denies any nipple discharge. She reports that she knows that yesterday evening and she thought was secondary to a bra that she wore a day. Today the erythema continued and therefore she made a work in appointment with [place] at the [facility].  She denies any trauma, fevers, chills, heat, etc. Oncologically, her ROS questioning is negative.  She did have a mammogram performed 2 weeks ago as mentioned above and it was within normal limits.  She reports that this happened a number of years ago previously without any explanation and resolved on its own.  She was seen by Dr. Duncan Dull fields in March when she underwent an EGD and part of her workup included a CT of chest which showed a ground glass appearance of the left lung likely is scar or fibrosis from radiation. Since there was not a comparison CT of chest, it is recommended CT of chest be performed in 3-6 months from the time of the initial CT scan in March. As a result, we'll set her up for a CT of chest in September or October.  BP 148/90  Pulse 118  Temp(Src) 97.4 F (36.3 C) (Oral)  Resp 20 Gen: NAD, anxious HEENT: Atraumatic Neck: Trachea midline,  supple Breast: Right breast is WNL without any masses or lesion.  Left breast is erythematous on the medial, inferior and lateral portion in a U-like shape.  No obvious masses or lesions.  No heat associated with the erythema.  The erythema is blanchable.  Skin: Warm and dry  Extremities: No edema Neuro: A and O x 3  RADIOLOGY:  03/12/2013  *RADIOLOGY REPORT*  Clinical Data: Screening.  DIGITAL SCREENING BILATERAL MAMMOGRAM WITH CAD  DIGITAL BREAST TOMOSYNTHESIS  Digital breast tomosynthesis images are acquired in two  projections. These images are reviewed in combination with the  digital mammogram, confirming the findings below.  Comparison: Previous exam(s).  FINDINGS:  ACR Breast Density Category c: The breast tissue is  heterogeneously dense, which may obscure small masses.  There are no findings suspicious for malignancy.  Images were processed with CAD.  IMPRESSION:  No mammographic evidence of malignancy.  A result letter of this screening mammogram will be mailed directly  to the patient.  RECOMMENDATION:  Screening mammogram in one year. (Code:SM-B-01Y)  BI-RADS CATEGORY 1: Negative.  Original Report Authenticated By: Christiana Pellant, M.D.    Assessment:  1. Left breast erythema 2. 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 Her date of surgery was 03/02/2005 with her initial biopsy. Cancer was ER positive 64%, PR positive at  97% HER-2/neu negative. Ki-67 marker was high at 52%. She received chemotherapy followed by radiation therapy followed by letrozole which she did not tolerate well. She was switched to tamoxifen and finished 5 years of therapy in May of 2012.  Recent mammography on 03/17/2013 was negative and BIRADS Cat 1.    Plan: 1. I personally reviewed and went over radiographic studies with the patient. 2. Advised the patient to  avoid breast manipulation x 2 weeks 3. She may continue left arm massaging 4. CT of chest in September/October 2014 for follow-up of March 2014 CT of chest. 5. Return in 2 weeks for breast follow-up.  Corbet Hanley

## 2013-04-08 ENCOUNTER — Ambulatory Visit (HOSPITAL_COMMUNITY): Payer: 59 | Admitting: Oncology

## 2013-06-01 ENCOUNTER — Ambulatory Visit (HOSPITAL_COMMUNITY)
Admission: RE | Admit: 2013-06-01 | Discharge: 2013-06-01 | Disposition: A | Discharge status: Home / Self Care | Payer: 59 | Source: Ambulatory Visit | Attending: Oncology | Admitting: Oncology

## 2013-06-01 DIAGNOSIS — Z853 Personal history of malignant neoplasm of breast: Secondary | ICD-10-CM | POA: Insufficient documentation

## 2013-06-01 DIAGNOSIS — R9389 Abnormal findings on diagnostic imaging of other specified body structures: Secondary | ICD-10-CM | POA: Insufficient documentation

## 2013-06-01 DIAGNOSIS — N644 Mastodynia: Secondary | ICD-10-CM | POA: Insufficient documentation

## 2013-06-01 LAB — POCT I-STAT CREATININE: Creatinine, Ser: 1 mg/dL (ref 0.50–1.10)

## 2013-06-01 MED ORDER — IOHEXOL 300 MG/ML  SOLN
80.0000 mL | Freq: Once | INTRAMUSCULAR | Status: AC | PRN
  Administered 2013-06-01: 80 mL via INTRAVENOUS

## 2013-06-02 ENCOUNTER — Other Ambulatory Visit (HOSPITAL_COMMUNITY): Payer: Self-pay | Admitting: Oncology

## 2013-06-02 DIAGNOSIS — Z853 Personal history of malignant neoplasm of breast: Secondary | ICD-10-CM

## 2013-06-02 DIAGNOSIS — R9389 Abnormal findings on diagnostic imaging of other specified body structures: Secondary | ICD-10-CM

## 2013-07-28 ENCOUNTER — Encounter (HOSPITAL_COMMUNITY): Payer: Self-pay

## 2013-07-28 ENCOUNTER — Encounter (HOSPITAL_COMMUNITY): Payer: 59 | Attending: Hematology and Oncology

## 2013-07-28 VITALS — BP 136/79 | HR 92 | Temp 98.1°F | Resp 16 | Wt 135.6 lb

## 2013-07-28 DIAGNOSIS — R9389 Abnormal findings on diagnostic imaging of other specified body structures: Secondary | ICD-10-CM

## 2013-07-28 DIAGNOSIS — E611 Iron deficiency: Secondary | ICD-10-CM

## 2013-07-28 DIAGNOSIS — C50919 Malignant neoplasm of unspecified site of unspecified female breast: Secondary | ICD-10-CM

## 2013-07-28 DIAGNOSIS — Z853 Personal history of malignant neoplasm of breast: Secondary | ICD-10-CM | POA: Insufficient documentation

## 2013-07-28 DIAGNOSIS — D509 Iron deficiency anemia, unspecified: Secondary | ICD-10-CM | POA: Insufficient documentation

## 2013-07-28 DIAGNOSIS — I890 Lymphedema, not elsewhere classified: Secondary | ICD-10-CM

## 2013-07-28 DIAGNOSIS — Z09 Encounter for follow-up examination after completed treatment for conditions other than malignant neoplasm: Secondary | ICD-10-CM | POA: Insufficient documentation

## 2013-07-28 LAB — CBC WITH DIFFERENTIAL/PLATELET
Basophils Absolute: 0.1 10*3/uL (ref 0.0–0.1)
Basophils Relative: 1 % (ref 0–1)
Eosinophils Absolute: 0 10*3/uL (ref 0.0–0.7)
MCH: 30.6 pg (ref 26.0–34.0)
MCHC: 33.7 g/dL (ref 30.0–36.0)
Neutro Abs: 3.4 10*3/uL (ref 1.7–7.7)
Neutrophils Relative %: 64 % (ref 43–77)
RDW: 12.5 % (ref 11.5–15.5)

## 2013-07-28 LAB — COMPREHENSIVE METABOLIC PANEL
AST: 20 U/L (ref 0–37)
Albumin: 4 g/dL (ref 3.5–5.2)
Alkaline Phosphatase: 65 U/L (ref 39–117)
Chloride: 100 mEq/L (ref 96–112)
Creatinine, Ser: 0.81 mg/dL (ref 0.50–1.10)
Potassium: 3.4 mEq/L — ABNORMAL LOW (ref 3.5–5.1)
Total Bilirubin: 0.7 mg/dL (ref 0.3–1.2)
Total Protein: 7.8 g/dL (ref 6.0–8.3)

## 2013-07-28 LAB — FERRITIN: Ferritin: 27 ng/mL (ref 10–291)

## 2013-07-28 LAB — CANCER ANTIGEN 27.29: CA 27.29: 41 U/mL — ABNORMAL HIGH (ref 0–39)

## 2013-07-28 LAB — CEA: CEA: 1.8 ng/mL (ref 0.0–5.0)

## 2013-07-28 MED ORDER — LETROZOLE 2.5 MG TABS #30 CALGB 40503: 2.5000 mg | ORAL_TABLET | Freq: Every day | ORAL | Status: DC

## 2013-07-28 NOTE — Patient Instructions (Signed)
[facility] Discharge Instructions  RECOMMENDATIONS MADE BY THE CONSULTANT AND ANY TEST RESULTS WILL BE SENT TO YOUR REFERRING PHYSICIAN.  EXAM FINDINGS BY THE PHYSICIAN TODAY AND SIGNS OR SYMPTOMS TO REPORT TO CLINIC OR PRIMARY PHYSICIAN: Exam and findings as discussed by Dr. Zigmund Daniel.  MEDICATIONS PRESCRIBED:  1.  Femara  INSTRUCTIONS/FOLLOW-UP: 1.  You are being scheduled for an MRI of your breast. 2.  Return in 1 month to see Dr. Zigmund Daniel to evaluate how you're doing with the Femara. 3.  Labs done today, and we will contact you with abnormal results.  Thank you for choosing [facility] to provide your oncology and hematology care.  To afford each patient quality time with our providers, please arrive at least 15 minutes before your scheduled appointment time.  With your help, our goal is to use those 15 minutes to complete the necessary work-up to ensure our physicians have the information they need to help with your evaluation and healthcare recommendations.    Effective January 1st, 2014, we ask that you re-schedule your appointment with our physicians should you arrive 10 or more minutes late for your appointment.  We strive to give you quality time with our providers, and arriving late affects you and other patients whose appointments are after yours.    Again, thank you for choosing [facility].  Our hope is that these requests will decrease the amount of time that you wait before being seen by our physicians.       _____________________________________________________________  Should you have questions after your visit to [facility], please contact our office at [phone] between the hours of 8:30 a.m. and 5:00 p.m.  Voicemails left after 4:30 p.m. will not be returned until the following business day.  For prescription refill requests, have your pharmacy contact our office with your prescription refill request.

## 2013-07-28 NOTE — Progress Notes (Signed)
[facility]  OFFICE PROGRESS NOTE  [name], MD [address] [place] [address]  DIAGNOSIS: Malignant neoplasm of breast (female), unspecified site - Plan: CBC with Differential, Comprehensive metabolic panel, CEA, Cancer antigen 27.29  Abnormal CT scan, chest  Iron deficiency - Plan: Ferritin  Chief Complaint  Patient presents with  . Breast Cancer    CURRENT THERAPY: watchful expectation  INTERVAL HISTORY: [name] [name] 62 y.o. female returns for followup of stage II left breast cancer, status post lumpectomy, axillary dissection, TAC chemotherapy for 6 cycles, radiation therapy, followed by letrozoleand do to poor tolerance followed by tamoxifen with endocrine treatment ending in May of 2012. She received a total of 5 years of adjuvant endocrine treatment. She continues to do well with normal self breast examination except for considerable induration in the left breast. She hasn't been lymphedema but does wear a sleeve during the day. She denies any cough or significant nasal drip although she does have allergies at this time of year. Hot flashes are minimal particularly at night. She denies any vaginal discharge, bleeding, or pain. Appetite has been good with no nausea, vomiting, diarrhea, constipation, dysuria, hematuria, incontinence, lower extremity swelling or redness, PND, orthopnea, palpitations, headache, or peripheral paresthesias.  MEDICAL HISTORY: Past Medical History  Diagnosis Date  . Hypertension   . GERD (gastroesophageal reflux disease)   . Breast cancer     INTERIM HISTORY: has Hx Breast cancer, IDC, Left, Stage II, receptor+, Her2 -; HEMORRHOIDS; GERD; Anemia; Dyspepsia; and Chest pain on her problem list.   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 Her date of surgery  was 03/02/2005 with her initial biopsy. Cancer was ER positive 64%, PR positive at 97% HER-2/neu negative. Ki-67 marker was high at 52%. She received chemotherapy followed by radiation therapy followed by letrozole which she did not tolerate well. She was switched to tamoxifen and finished 5 years of therapy in May of 2012. Recent mammography on 03/17/2013 was negative and BIRADS Cat 1  ALLERGIES:  is allergic to sulfur.  MEDICATIONS: has a current medication list which includes the following prescription(s): calcium carb-cholecalciferol, diphenhydramine, docusate sodium, enalapril, ferrous sulfate, and pantoprazole.  SURGICAL HISTORY:  Past Surgical History  Procedure Laterality Date  . Esophagogastroduodenoscopy  2009    normal stomach,esophagues without mass,reosion  . Colonoscopy  2009    normal colon without evidence of polyps  . Breast lumpectomy  2006    Left  breast  . Breast lumpectomy  1994    Right breast-benign  . Givens capsule study  09/26/2011    Procedure: GIVENS CAPSULE STUDY;  Surgeon: Arlyce Harman, MD;  Location: [facility];  Service: Endoscopy;  Laterality: N/A;  . Ileocolonoscopy  09/17/2011    SLF: Nl colonoscopy WMO/INTERNA; HEMORRHOIDS/ NO SOURCE FOR IRON DEFICIENCY ANEMA IDENTIFIED  . Esophagogastroduodenoscopy  09/17/2011    SLF: Mild gastritis/NO SOURCE FOR IRON DEFICINECY ANEMIA IDENTIFIED  . Esophagogastroduodenoscopy N/A 12/02/2012    Procedure: ESOPHAGOGASTRODUODENOSCOPY (EGD);  Surgeon: West Bali, MD;  Location: [facility];  Service: Endoscopy;  Laterality: N/A;  8:30-moved to [address] notified pt     FAMILY HISTORY: family history is negative for Anesthesia problems, Hypotension, Malignant hyperthermia, and Pseudochol deficiency.  SOCIAL HISTORY:  reports that she has never smoked. She does  not have any smokeless tobacco history on file. She reports that she does not drink alcohol or use illicit drugs.  REVIEW OF SYSTEMS:  Other than that  discussed above is noncontributory.  PHYSICAL EXAMINATION: ECOG PERFORMANCE STATUS: 1 - Symptomatic but completely ambulatory  Weight 135 lb 9.6 oz (61.508 kg).  GENERAL:alert, no distress and comfortable SKIN: skin color, texture, turgor are normal, no rashes or significant lesions EYES: PERLA; Conjunctiva are pink and non-injected, sclera clear OROPHARYNX:no exudate, no erythema on lips, buccal mucosa, or tongue. NECK: supple, thyroid normal size, non-tender, without nodularity. No masses CHEST: status post left breast lumpectomy with considerable induration with evidence of telangiectasias EI in the left axilla. Right breast is lab mass. LYMPH:  no palpable lymphadenopathy in the cervical, axillary or inguinal LUNGS: clear to auscultation and percussion with normal breathing effort HEART: regular rate & rhythm and no murmurs. ABDOMEN:abdomen soft, non-tender and normal bowel sounds MUSCULOSKELETAL:no cyanosis of digits and no clubbing. Range of motion normal.  NEURO: alert & oriented x 3 with fluent speech, no focal motor/sensory deficits   LABORATORY DATA: No visits with results within 30 Day(s) from this visit. Latest known visit with results is:  Hospital Outpatient Visit on 06/01/2013  Component Date Value Range Status  . Creatinine, Ser 06/01/2013 1.00  0.50 - 1.10 mg/dL Final    PATHOLOGY:none new.  Urinalysis No results found for this basename: colorurine, appearanceur, labspec, phurine, glucoseu, hgbur, bilirubinur, ketonesur, proteinur, urobilinogen, nitrite, leukocytesur    RADIOGRAPHIC STUDIES: MM Digital Screening Status: Final result            Study Result    *RADIOLOGY REPORT*  Clinical Data: Screening.  DIGITAL SCREENING BILATERAL MAMMOGRAM WITH CAD  DIGITAL BREAST TOMOSYNTHESIS  Digital breast tomosynthesis images are acquired in two  projections. These images are reviewed in combination with the  digital mammogram, confirming the findings  below.  Comparison: Previous exam(s).  FINDINGS:  ACR Breast Density Category c: The breast tissue is  heterogeneously dense, which may obscure small masses.  There are no findings suspicious for malignancy.  Images were processed with CAD.  IMPRESSION:  No mammographic evidence of malignancy.  A result letter of this screening mammogram will be mailed directly  to the patient.  RECOMMENDATION:  Screening mammogram in one year. (Code:SM-B-01Y)  BI-RADS CATEGORY 1: Negative.  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 Her date of surgery was 03/02/2005 with her initial biopsy. Cancer was ER positive 64%, PR positive at 97% HER-2/neu negative. Ki-67 marker was high at 52%. She received chemotherapy followed by radiation therapy followed by letrozole which she did not tolerate well. She was switched to tamoxifen and finished 5 years of therapy in May of 2012. Recent mammography on 03/17/2013 was negative and BIRADS Cat 1.  #2. Minimal left upper extremity lymphedema. #3. Gastroesophageal reflux disease, controlled. #4. Benign breast biopsy on the right in 1993.   PLAN:  #1. A discussion was held with the patient regarding the benefit of extended adjuvant therapy with endocrine intervention. She is willing to try letrozole 2.5 mg daily which was ordered. Baseline lab tests will be done today including tumor markers.MRI of the breast ordered. #2. Followup in one month for drug tolerability. Patient was told to call should she develop increasing joint discomfort, vaginal dryness, or worsening  hot flashes.   All questions were answered. The patient knows to call the clinic with any problems, questions or concerns. We can certainly see the patient much sooner if necessary.   I spent 25 minutes counseling the  patient face to face. The total time spent in the appointment was 30 minutes.    Maurilio Lovely, MD 07/28/2013 9:13 AM

## 2013-07-29 ENCOUNTER — Other Ambulatory Visit (HOSPITAL_COMMUNITY): Payer: Self-pay

## 2013-07-29 DIAGNOSIS — Z853 Personal history of malignant neoplasm of breast: Secondary | ICD-10-CM

## 2013-07-31 ENCOUNTER — Other Ambulatory Visit (HOSPITAL_COMMUNITY): Payer: 59 | Admitting: Oncology

## 2013-08-05 ENCOUNTER — Other Ambulatory Visit (HOSPITAL_COMMUNITY): Payer: 59

## 2013-08-06 ENCOUNTER — Ambulatory Visit (HOSPITAL_COMMUNITY)
Admission: RE | Admit: 2013-08-06 | Discharge: 2013-08-06 | Disposition: A | Discharge status: Home / Self Care | Payer: 59 | Source: Ambulatory Visit | Attending: Hematology and Oncology | Admitting: Hematology and Oncology

## 2013-08-06 DIAGNOSIS — C50919 Malignant neoplasm of unspecified site of unspecified female breast: Secondary | ICD-10-CM | POA: Insufficient documentation

## 2013-08-06 MED ORDER — GADOBENATE DIMEGLUMINE 529 MG/ML IV SOLN
12.0000 mL | Freq: Once | INTRAVENOUS | Status: AC | PRN
  Administered 2013-08-06: 12 mL via INTRAVENOUS

## 2013-08-20 ENCOUNTER — Encounter: Payer: Self-pay | Admitting: Gastroenterology

## 2013-08-20 ENCOUNTER — Ambulatory Visit (INDEPENDENT_AMBULATORY_CARE_PROVIDER_SITE_OTHER): Payer: 59 | Admitting: Gastroenterology

## 2013-08-20 VITALS — BP 133/80 | HR 94 | Temp 98.4°F | Wt 134.2 lb

## 2013-08-20 DIAGNOSIS — R1013 Epigastric pain: Secondary | ICD-10-CM

## 2013-08-20 DIAGNOSIS — K3189 Other diseases of stomach and duodenum: Secondary | ICD-10-CM

## 2013-08-20 DIAGNOSIS — K219 Gastro-esophageal reflux disease without esophagitis: Secondary | ICD-10-CM

## 2013-08-20 MED ORDER — ESOMEPRAZOLE MAGNESIUM 40 MG PO CPDR: DELAYED_RELEASE_CAPSULE | ORAL | Status: DC

## 2013-08-20 NOTE — Assessment & Plan Note (Signed)
SX NOT IDEALLY CONTROLLED.  STOP PROTONIX. RE-START NEXIUM. TAKE 30 MINUTES BEFORE MEALS. FOLLOW A LOW FAT DIET.  AVOID FOOD THAT TRIGGERS REFLUX.  FOLLOW UP IN JUN 2015.

## 2013-08-20 NOTE — Progress Notes (Signed)
   Subjective:    Patient ID: [name] [name], female    DOB: 1951-02-01, 62 y.o.   MRN: [id number]  [name] Obey, MD  HPI Reflux and heartburn may act up. HAS A BITTER TASTE. FEELS HUNGRY ALL THE TIME. THINKS SHE NEEDS HER NEXIUM. PRILOSEC DOESN'T WORK AS WELL. EATING BLOCK CHEESE. SX WORSE ON THE WEEKENDS. PT DENIES FEVER, CHILLS, BRBPR, nausea, vomiting, melena, diarrhea, constipation, abd pain, problems swallowing, problems with sedation, heartburn or indigestion.  Past Medical History  Diagnosis Date  . Hypertension   . GERD (gastroesophageal reflux disease)   . Breast cancer    Past Medical History  Diagnosis Date  . Hypertension   . GERD (gastroesophageal reflux disease)   . Breast cancer    Allergies  Allergen Reactions  . Sulfur Itching and Rash   Current Outpatient Prescriptions  Medication Sig Dispense Refill  . Calcium Carb-Cholecalciferol (CALCIUM 1000 + D PO) Take 2 tablets by mouth 2 (two) times daily.        . diphenhydrAMINE (BENADRYL) 25 MG tablet Take 25 mg by mouth every 6 (six) hours as needed for allergies.      Marland Kitchen docusate sodium (COLACE) 100 MG capsule Take 200 mg by mouth at bedtime.      . enalapril (VASOTEC) 10 MG tablet Take 10 mg by mouth daily.        . ferrous sulfate 325 (65 FE) MG tablet Take 325 mg by mouth daily. Only takes on Tues, [place], and Sat.      . Investigational letrozole 2.5MG  tablet CALGB 78295 Take 1 tablet (2.5 mg total) by mouth daily. GENERIC FOR FOMERA   . pantoprazole (PROTONIX) 40 MG tablet 1 PO 30 MINUTES PRIOR TO MEALS BID       Review of Systems     Objective:   Physical Exam  Vitals reviewed. Constitutional: She is oriented to person, place, and time. She appears well-developed and well-nourished. No distress.  HENT:  Head: Normocephalic and atraumatic.  Mouth/Throat: Oropharynx is clear and moist. No oropharyngeal exudate.  Eyes: Pupils are equal, round, and reactive to light. No scleral icterus.  Neck: Normal  range of motion. Neck supple.  Cardiovascular: Normal rate, regular rhythm and normal heart sounds.   Pulmonary/Chest: Effort normal and breath sounds normal. No respiratory distress.  Abdominal: Soft. Bowel sounds are normal. She exhibits no distension. There is no tenderness.  Musculoskeletal: She exhibits no edema.  Lymphadenopathy:    She has no cervical adenopathy.  Neurological: She is alert and oriented to person, place, and time.  NO FOCAL DEFICITS   Psychiatric: She has a normal mood and affect.          Assessment & Plan:

## 2013-08-20 NOTE — Assessment & Plan Note (Signed)
SX NOT IDEALLY CONTROLLED ON PROTONIX.  STOP PROTONIX. RE-START NEXIUM. TAKE 30 MINUTES BEFORE MEALS. FOLLOW A LOW FAT DIET.  AVOID FOOD THAT TRIGGERS REFLUX.  FOLLOW UP IN JUN 2015.

## 2013-08-20 NOTE — Patient Instructions (Signed)
  STOP PROTONIX.  RE-START NEXIUM. TAKE 30 MINUTES BEFORE MEALS.  FOLLOW A LOW FAT DIET.   AVOID FOOD THAT TRIGGERS REFLUX.   FOLLOW UP IN JUN 2015.   Lifestyle and home remedies TO HELP CONTROL REFLUX.  You may eliminate or reduce the frequency of heartburn by making the following lifestyle changes:    Control your weight. Being overweight is a major risk factor for heartburn and GERD. Excess pounds put pressure on your abdomen, pushing up your stomach and causing acid to back up into your esophagus.     Eat smaller meals. 4 TO 6 MEALS A DAY. This reduces pressure on the lower esophageal sphincter, helping to prevent the valve from opening and acid from washing back into your esophagus.     Loosen your belt. Clothes that fit tightly around your waist put pressure on your abdomen and the lower esophageal sphincter.       Eliminate heartburn triggers. Everyone has specific triggers.      Common triggers such as fatty or fried foods, spicy food, tomato sauce, carbonated beverages, alcohol, chocolate, mint, garlic, onion, caffeine and nicotine may make heartburn worse.     Avoid stooping or bending. Tying your shoes is OK. Bending over for longer periods to weed your garden isn't, especially soon after eating.     Don't lie down after a meal. Wait at least three to four hours after eating before going to bed, and don't lie down right after eating.     PLACE THE HEAD OF YOUR BED ON 6 INCH BLOCKS.  Alternative medicine   Several home remedies exist for treating GERD, but they provide only temporary relief. They include drinking baking soda (sodium bicarbonate) added to water or drinking other fluids such as baking soda mixed with cream of tartar and water.   Although these liquids create temporary relief by neutralizing, washing away or buffering acids, eventually they aggravate the situation by adding gas and fluid to your stomach, increasing pressure and causing more acid reflux.  Further, adding more sodium to your diet may increase your blood pressure and add stress to your heart, and excessive bicarbonate ingestion can alter the acid-base balance in your body.

## 2013-08-21 ENCOUNTER — Encounter (HOSPITAL_COMMUNITY): Payer: 59 | Attending: Hematology and Oncology

## 2013-08-21 DIAGNOSIS — Z853 Personal history of malignant neoplasm of breast: Secondary | ICD-10-CM | POA: Insufficient documentation

## 2013-08-21 NOTE — Progress Notes (Signed)
[name] [name] presented for labwork. Labs per MD order drawn via Peripheral Line 23 gauge needle inserted in RT AC  Good blood return present. Procedure without incident.  Needle removed intact. Patient tolerated procedure well.

## 2013-08-21 NOTE — Progress Notes (Signed)
Reminder in epic °

## 2013-08-22 LAB — CANCER ANTIGEN 27.29: CA 27.29: 47 U/mL — ABNORMAL HIGH (ref 0–39)

## 2013-08-24 ENCOUNTER — Encounter (HOSPITAL_COMMUNITY): Payer: Self-pay

## 2013-08-24 ENCOUNTER — Encounter (HOSPITAL_BASED_OUTPATIENT_CLINIC_OR_DEPARTMENT_OTHER): Payer: 59

## 2013-08-24 VITALS — BP 149/83 | HR 130 | Temp 97.9°F | Resp 16 | Wt 135.2 lb

## 2013-08-24 DIAGNOSIS — I890 Lymphedema, not elsewhere classified: Secondary | ICD-10-CM

## 2013-08-24 DIAGNOSIS — Z853 Personal history of malignant neoplasm of breast: Secondary | ICD-10-CM

## 2013-08-24 DIAGNOSIS — C50919 Malignant neoplasm of unspecified site of unspecified female breast: Secondary | ICD-10-CM

## 2013-08-24 NOTE — Patient Instructions (Signed)
[facility] Discharge Instructions  RECOMMENDATIONS MADE BY THE CONSULTANT AND ANY TEST RESULTS WILL BE SENT TO YOUR REFERRING PHYSICIAN.  EXAM FINDINGS BY THE PHYSICIAN TODAY AND SIGNS OR SYMPTOMS TO REPORT TO CLINIC OR PRIMARY PHYSICIAN: Exam and findings as discussed by Dr. Zigmund Daniel.  Elevation of lab test could be related to your gastritis.  Will re-check labs in 3 months.  Report any new lumps, bone pain or shortness of breath.  If joint discomfort worsens let us know.  MEDICATIONS PRESCRIBED:  none  INSTRUCTIONS/FOLLOW-UP: 3 months with blood work and MD visit.  Thank you for choosing [facility] to provide your oncology and hematology care.  To afford each patient quality time with our providers, please arrive at least 15 minutes before your scheduled appointment time.  With your help, our goal is to use those 15 minutes to complete the necessary work-up to ensure our physicians have the information they need to help with your evaluation and healthcare recommendations.    Effective January 1st, 2014, we ask that you re-schedule your appointment with our physicians should you arrive 10 or more minutes late for your appointment.  We strive to give you quality time with our providers, and arriving late affects you and other patients whose appointments are after yours.    Again, thank you for choosing [facility].  Our hope is that these requests will decrease the amount of time that you wait before being seen by our physicians.       _____________________________________________________________  Should you have questions after your visit to [facility], please contact our office at [phone] between the hours of 8:30 a.m. and 5:00 p.m.  Voicemails left after 4:30 p.m. will not be returned until the following business day.  For prescription refill requests, have your pharmacy contact our office with your prescription refill  request.

## 2013-08-24 NOTE — Progress Notes (Signed)
cc'd to pcp 

## 2013-08-24 NOTE — Progress Notes (Signed)
[facility]  OFFICE PROGRESS NOTE  [name], MD [address] [place] [address]  DIAGNOSIS: Hx Breast cancer, IDC, Left, Stage II, receptor+, Her2 -  Chief Complaint  Patient presents with  . Breast Cancer    CURRENT THERAPY: Letrozole 2.5 mg daily representing extended adjuvant endocrine therapy.  INTERVAL HISTORY: [name] [name] 62 y.o. female returns for followup after starting on letrozole 2.5 mg daily as extended endocrine treatment adjuvantly for breast cancer. She finished 5 years of tamoxifen in May of 2012 and was on no treatment until a month ago when discussion was held with the patient regarding the benefits of extended endocrine treatment and for that reason she agreed to try letrozole.  She is concerned because her tumor markers slightly elevated with CA 27-29 going from 41 in November to 57 in December with normal being up to 39. She's been complaining of gastric distress as well and was recently endoscoped with findings of gastritis not very responsive to Protonix. but very responsive to Nexium which she has begun taking. She does have chronic left hip pain which has not gotten any worse and also has vaginal dryness for which he uses KY jelly. She denies any cough, wheezing, nasal drip, diarrhea, constipation, melena, hematochezia, hematuria, nausea, or vomiting. Her abdominal discomfort actually manifests as a desire to eat.  MEDICAL HISTORY: Past Medical History  Diagnosis Date  . Hypertension   . GERD (gastroesophageal reflux disease)   . Breast cancer     INTERIM HISTORY: has Hx Breast cancer, IDC, Left, Stage II, receptor+, Her2 -; HEMORRHOIDS; GERD; Anemia; Dyspepsia; and Chest pain on her problem list.   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 Her date of surgery was  03/02/2005 with her initial biopsy. Cancer was ER positive 64%, PR positive at 97% HER-2/neu negative. Ki-67 marker was high at 52%. She received chemotherapy followed by radiation therapy followed by letrozole which she did not tolerate well. She was switched to tamoxifen and finished 5 years of therapy in May of 2012. Recent mammography on 03/17/2013 was negative and BIRADS Cat 1     ALLERGIES:  is allergic to sulfur.  MEDICATIONS: has a current medication list which includes the following prescription(s): calcium carb-cholecalciferol, diphenhydramine, docusate sodium, enalapril, esomeprazole, ferrous sulfate, and investigational letrozole.  SURGICAL HISTORY:  Past Surgical History  Procedure Laterality Date  . Esophagogastroduodenoscopy  2009    normal stomach,esophagues without mass,reosion  . Colonoscopy  2009    normal colon without evidence of polyps  . Breast lumpectomy  2006    Left  breast  . Breast lumpectomy  1994    Right breast-benign  . Givens capsule study  09/26/2011    Procedure: GIVENS CAPSULE STUDY;  Surgeon: Arlyce Harman, MD;  [facility];  Service: Endoscopy;  Laterality: N/A;  . Ileocolonoscopy  09/17/2011    SLF: Nl colonoscopy WMO/INTERNA; HEMORRHOIDS/ NO SOURCE FOR IRON DEFICIENCY ANEMA IDENTIFIED  . Esophagogastroduodenoscopy  09/17/2011    SLF: Mild gastritis/NO SOURCE FOR IRON DEFICINECY ANEMIA IDENTIFIED  . Esophagogastroduodenoscopy N/A 12/02/2012    Procedure: ESOPHAGOGASTRODUODENOSCOPY (EGD);  Surgeon: West Bali, MD;  Location: [facility];  Service: Endoscopy;  Laterality: N/A;  8:30-moved to [address] notified pt     FAMILY HISTORY: family history is  negative for Anesthesia problems, Hypotension, Malignant hyperthermia, and Pseudochol deficiency.  SOCIAL HISTORY:  reports that she has never smoked. She does not have any smokeless tobacco history on file. She reports that she does not drink alcohol or use illicit drugs.  REVIEW OF  SYSTEMS:  Other than that discussed above is noncontributory.  PHYSICAL EXAMINATION: ECOG PERFORMANCE STATUS: 2 - Symptomatic, <50% confined to bed  Weight 135 lb 3.2 oz (61.326 kg).  GENERAL:alert, no distress and comfortable SKIN: skin color, texture, turgor are normal, no rashes or significant lesions EYES: PERLA; Conjunctiva are pink and non-injected, sclera clear OROPHARYNX:no exudate, no erythema on lips, buccal mucosa, or tongue. NECK: supple, thyroid normal size, non-tender, without nodularity. No masses CHEST: Status post left breast lumpectomy with induration and evidence of telangiectasiae in the left axilla. Right breast without mass. LYMPH:  no palpable lymphadenopathy in the cervical, axillary or inguinal LUNGS: clear to auscultation and percussion with normal breathing effort HEART: regular rate & rhythm and no murmurs. ABDOMEN:abdomen soft, non-tender and normal bowel sounds MUSCULOSKELETAL:no cyanosis of digits and no clubbing. Range of motion normal.  NEURO: alert & oriented x 3 with fluent speech, no focal motor/sensory deficits. Minimal left upper extremity lymphedema.   LABORATORY DATA: Infusion on 08/21/2013  Component Date Value Range Status  . CA 27.29 08/21/2013 47* 0 - 39 U/mL Final   Performed at [facility] Visit on 07/28/2013  Component Date Value Range Status  . WBC 07/28/2013 5.3  4.0 - 10.5 K/uL Final  . RBC 07/28/2013 4.45  3.87 - 5.11 MIL/uL Final  . Hemoglobin 07/28/2013 13.6  12.0 - 15.0 g/dL Final  . HCT 11/91/4782 40.4  36.0 - 46.0 % Final  . MCV 07/28/2013 90.8  78.0 - 100.0 fL Final  . MCH 07/28/2013 30.6  26.0 - 34.0 pg Final  . MCHC 07/28/2013 33.7  30.0 - 36.0 g/dL Final  . RDW 95/62/1308 12.5  11.5 - 15.5 % Final  . Platelets 07/28/2013 244  150 - 400 K/uL Final  . Neutrophils Relative % 07/28/2013 64  43 - 77 % Final  . Neutro Abs 07/28/2013 3.4  1.7 - 7.7 K/uL Final  . Lymphocytes Relative 07/28/2013 27  12 - 46 %  Final  . Lymphs Abs 07/28/2013 1.4  0.7 - 4.0 K/uL Final  . Monocytes Relative 07/28/2013 7  3 - 12 % Final  . Monocytes Absolute 07/28/2013 0.4  0.1 - 1.0 K/uL Final  . Eosinophils Relative 07/28/2013 1  0 - 5 % Final  . Eosinophils Absolute 07/28/2013 0.0  0.0 - 0.7 K/uL Final  . Basophils Relative 07/28/2013 1  0 - 1 % Final  . Basophils Absolute 07/28/2013 0.1  0.0 - 0.1 K/uL Final  . Sodium 07/28/2013 137  135 - 145 mEq/L Final  . Potassium 07/28/2013 3.4* 3.5 - 5.1 mEq/L Final  . Chloride 07/28/2013 100  96 - 112 mEq/L Final  . CO2 07/28/2013 27  19 - 32 mEq/L Final  . Glucose, Bld 07/28/2013 107* 70 - 99 mg/dL Final  . BUN 65/78/4696 11  6 - 23 mg/dL Final  . Creatinine, Ser 07/28/2013 0.81  0.50 - 1.10 mg/dL Final  . Calcium 29/52/8413 10.1  8.4 - 10.5 mg/dL Final  . Total Protein 07/28/2013 7.8  6.0 - 8.3 g/dL Final  . Albumin 24/40/1027 4.0  3.5 - 5.2 g/dL Final  . AST 25/36/6440 20  0 - 37 U/L Final  . ALT 07/28/2013 12  0 - 35 U/L Final  . Alkaline Phosphatase 07/28/2013 65  39 - 117 U/L Final  . Total Bilirubin 07/28/2013 0.7  0.3 - 1.2 mg/dL Final  . GFR calc non Af Amer 07/28/2013 77* >90 mL/min Final  . GFR calc Af Amer 07/28/2013 89* >90 mL/min Final   Comment: (NOTE)                          The eGFR has been calculated using the CKD EPI equation.                          This calculation has not been validated in all clinical situations.                          eGFR's persistently <90 mL/min signify possible Chronic Kidney                          Disease.  . CEA 07/28/2013 1.8  0.0 - 5.0 ng/mL Final   Performed at [facility]  . CA 27.29 07/28/2013 41* 0 - 39 U/mL Final   Performed at [facility]  . Ferritin 07/28/2013 27  10 - 291 ng/mL Final   Performed at [facility]    PATHOLOGY: No new pathology.  Urinalysis No results found for this basename: colorurine, appearanceur, labspec, phurine, glucoseu, hgbur, bilirubinur,  ketonesur, proteinur, urobilinogen, nitrite, leukocytesur    RADIOGRAPHIC STUDIES: Mr Breast Bilateral W Wo Contrast  08/06/2013   CLINICAL DATA:  62 year old female with prior history of left breast cancer postlumpectomy with axillary lymph node dissection in 2006, chemotherapy, radiation therapy, hormonal therapy. Remote benign excisional biopsy in the right breast.  EXAM: BILATERAL BREAST MRI WITH AND WITHOUT CONTRAST  LABS:  Most recent serum creatinine:   0.8 mg per dL on 09/81/1914.  TECHNIQUE: Multiplanar, multisequence MR images of both breasts were obtained prior to and following the intravenous administration of 15ml of MultiHance.  THREE-DIMENSIONAL MR IMAGE RENDERING ON INDEPENDENT WORKSTATION:  Three-dimensional MR images were rendered by post-processing of the original MR data on an independent workstation. The three-dimensional MR images were interpreted, and findings are reported in the following complete MRI report for this study. Three dimensional images were evaluated at the independent DynaCad workstation  COMPARISON:  Previous exams  FINDINGS: Breast composition: c:  Heterogeneous fibroglandular tissue  Background parenchymal enhancement:  Mild.  Right breast: No suspicious rapidly enhancing masses or abnormal areas of enhancement are seen in the right breast to suggest malignancy.  Left breast: Postsurgical changes are seen again in the inferior left breast from prior lumpectomy. There is persistent diffuse mild skin edema and thickening, consistent with history of prior radiation therapy. No suspicious rapidly enhancing masses or abnormal areas of enhancement are seen in the left breast to suggest malignancy.  Lymph nodes: No morphologically abnormal axillary or internal mammary lymph nodes seen.  Ancillary findings:  None.  IMPRESSION: No MRI evidence of malignancy in either breast.  RECOMMENDATION: 1.  Recommend routine screening mammography.  2. The American Cancer Societyrecommends  annual MRI and mammography in patients with an estimated lifetime risk of developing breast cancer greater than 20 - 25%, or who are known or suspected to be positive for the breast cancer gene.  BI-RADS CATEGORY  1: Negative   Electronically Signed   By: Victorino Dike  Derinda Late M.D.   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 Her date of surgery was 03/02/2005 with her initial biopsy. Cancer was ER positive 64%, PR positive at 97% HER-2/neu negative. Ki-67 marker was high at 52%. She received chemotherapy followed by radiation therapy followed by letrozole which she did not tolerate well. She was switched to tamoxifen and finished 5 years of therapy in May of 2012. Recent mammography on 03/17/2013 was negative and BIRADS Cat 1, tolerating letrozole well. #2. Minimally elevated CA 27-29, probably related to gastritis on recent EGD. #3. Left upper extremity lymphedema, wearing a sleeve. #4. Benign breast biopsy right in 1993.    PLAN:  #1. The patient was reassured. Even if the elevation in CA 27-29 represented recurrent disease, he is being treated for such with letrozole. #2. She was told to call should joint pain become more troublesome since she did have problems with anastrozole about 5 years ago. #3. Followup in 3 months with lab tests. She is now willing to test CA 27-29 monthly because of poor third-party coverage.   All questions were answered. The patient knows to call the clinic with any problems, questions or concerns. We can certainly see the patient much sooner if necessary.   I spent 25 minutes counseling the patient face to face. The total time spent in the appointment was 30 minutes.    Maurilio Lovely, MD 08/24/2013 1:06 PM

## 2013-11-02 ENCOUNTER — Telehealth (HOSPITAL_COMMUNITY): Payer: Self-pay

## 2013-11-02 ENCOUNTER — Other Ambulatory Visit (HOSPITAL_COMMUNITY): Payer: Self-pay

## 2013-11-02 DIAGNOSIS — Z853 Personal history of malignant neoplasm of breast: Secondary | ICD-10-CM

## 2013-11-03 ENCOUNTER — Telehealth (HOSPITAL_COMMUNITY): Payer: Self-pay

## 2013-11-03 NOTE — Telephone Encounter (Signed)
error 

## 2013-11-03 NOTE — Telephone Encounter (Signed)
Spoke with [name] [name] and she states "I'm not sure I'm going to do any blood work this month and I need to think about the  feraheme infusion.  Not sure why I need it and I'll will talk with MD when I come for my appointment."

## 2013-11-03 NOTE — Telephone Encounter (Signed)
Message copied by Mellissa Kohut on Tue Nov 03, 2013 12:57 PM ------      Message from: Farrel Gobble A      Created: Sun Nov 01, 2013 10:47 AM       Have ordered Feraheme for 11/24/2013. Please get authorization and schedule appropriately along with OV already scheduled.  Thanks. Dr.F ------

## 2013-11-23 ENCOUNTER — Other Ambulatory Visit (HOSPITAL_COMMUNITY): Payer: 59

## 2013-11-24 ENCOUNTER — Ambulatory Visit (HOSPITAL_COMMUNITY): Payer: 59

## 2013-11-25 NOTE — Progress Notes (Signed)
This encounter was created in error - please disregard.

## 2014-01-21 ENCOUNTER — Encounter (HOSPITAL_COMMUNITY): Payer: 59 | Attending: Hematology and Oncology

## 2014-01-21 DIAGNOSIS — D509 Iron deficiency anemia, unspecified: Secondary | ICD-10-CM | POA: Insufficient documentation

## 2014-01-21 DIAGNOSIS — E611 Iron deficiency: Secondary | ICD-10-CM

## 2014-01-22 ENCOUNTER — Ambulatory Visit (HOSPITAL_COMMUNITY): Payer: 59

## 2014-01-22 ENCOUNTER — Other Ambulatory Visit (HOSPITAL_COMMUNITY): Payer: Self-pay | Admitting: Oncology

## 2014-01-22 DIAGNOSIS — Z853 Personal history of malignant neoplasm of breast: Secondary | ICD-10-CM

## 2014-01-22 DIAGNOSIS — D649 Anemia, unspecified: Secondary | ICD-10-CM

## 2014-01-27 ENCOUNTER — Encounter (HOSPITAL_BASED_OUTPATIENT_CLINIC_OR_DEPARTMENT_OTHER): Payer: 59

## 2014-01-27 DIAGNOSIS — Z853 Personal history of malignant neoplasm of breast: Secondary | ICD-10-CM

## 2014-01-27 DIAGNOSIS — D649 Anemia, unspecified: Secondary | ICD-10-CM

## 2014-01-27 LAB — COMPREHENSIVE METABOLIC PANEL
ALBUMIN: 4.2 g/dL (ref 3.5–5.2)
ALK PHOS: 73 U/L (ref 39–117)
ALT: 9 U/L (ref 0–35)
AST: 20 U/L (ref 0–37)
BILIRUBIN TOTAL: 0.9 mg/dL (ref 0.3–1.2)
BUN: 13 mg/dL (ref 6–23)
CHLORIDE: 100 meq/L (ref 96–112)
CO2: 25 mEq/L (ref 19–32)
Calcium: 10 mg/dL (ref 8.4–10.5)
Creatinine, Ser: 0.74 mg/dL (ref 0.50–1.10)
GFR calc Af Amer: >90 mL/min (ref >90)
GFR, EST NON AFRICAN AMERICAN: 89 mL/min — AB (ref >90)
Glucose, Bld: 103 mg/dL — ABNORMAL HIGH (ref 70–99)
Potassium: 3.5 mEq/L — ABNORMAL LOW (ref 3.7–5.3)
Sodium: 139 mEq/L (ref 137–147)
Total Protein: 7.8 g/dL (ref 6.0–8.3)

## 2014-01-27 LAB — IRON AND TIBC
IRON: 96 ug/dL (ref 42–135)
Saturation Ratios: 26 % (ref 20–55)
TIBC: 371 ug/dL (ref 250–470)
UIBC: 275 ug/dL (ref 125–400)

## 2014-01-27 LAB — CBC WITH DIFFERENTIAL/PLATELET
BASOS PCT: 1 % (ref 0–1)
Basophils Absolute: 0.1 10*3/uL (ref 0.0–0.1)
Eosinophils Absolute: 0 10*3/uL (ref 0.0–0.7)
Eosinophils Relative: 0 % (ref 0–5)
HEMATOCRIT: 38.3 % (ref 36.0–46.0)
HEMOGLOBIN: 12.8 g/dL (ref 12.0–15.0)
LYMPHS ABS: 1.1 10*3/uL (ref 0.7–4.0)
Lymphocytes Relative: 20 % (ref 12–46)
MCH: 29.4 pg (ref 26.0–34.0)
MCHC: 33.4 g/dL (ref 30.0–36.0)
MCV: 88 fL (ref 78.0–100.0)
MONOS PCT: 6 % (ref 3–12)
Monocytes Absolute: 0.3 10*3/uL (ref 0.1–1.0)
NEUTROS ABS: 4.1 10*3/uL (ref 1.7–7.7)
Neutrophils Relative %: 73 % (ref 43–77)
Platelets: 231 10*3/uL (ref 150–400)
RBC: 4.35 MIL/uL (ref 3.87–5.11)
RDW: 13.5 % (ref 11.5–15.5)
WBC: 5.6 10*3/uL (ref 4.0–10.5)

## 2014-01-27 LAB — FERRITIN: FERRITIN: 40 ng/mL (ref 10–291)

## 2014-01-27 LAB — CANCER ANTIGEN 27.29: CA 27.29: 36 U/mL (ref 0–39)

## 2014-01-29 NOTE — Progress Notes (Signed)
Labs drawn

## 2014-01-30 LAB — SOLUBLE TRANSFERRIN RECEPTOR: Transferrin Receptor, Soluble: 1.33 mg/L (ref 0.76–1.76)

## 2014-02-24 ENCOUNTER — Encounter: Payer: Self-pay | Admitting: Gastroenterology

## 2014-03-01 ENCOUNTER — Other Ambulatory Visit: Payer: Self-pay

## 2014-03-01 DIAGNOSIS — Z1231 Encounter for screening mammogram for malignant neoplasm of breast: Secondary | ICD-10-CM

## 2014-03-29 ENCOUNTER — Ambulatory Visit
Admission: RE | Admit: 2014-03-29 | Discharge: 2014-03-29 | Disposition: A | Discharge status: Home / Self Care | Payer: 59 | Source: Ambulatory Visit

## 2014-03-29 ENCOUNTER — Ambulatory Visit: Payer: 59

## 2014-03-29 DIAGNOSIS — Z1231 Encounter for screening mammogram for malignant neoplasm of breast: Secondary | ICD-10-CM

## 2014-06-02 ENCOUNTER — Ambulatory Visit (HOSPITAL_COMMUNITY): Payer: 59

## 2014-07-26 ENCOUNTER — Telehealth: Payer: Self-pay | Admitting: Gastroenterology

## 2014-07-26 NOTE — Telephone Encounter (Signed)
Tried to call pt at [phone]. Many rings and no answer.

## 2014-07-26 NOTE — Telephone Encounter (Signed)
Let's trial Dexilant samples. Provide copay card with samples. May help bring down the cost. Let us know if she likes it, and we can send in prescription.

## 2014-07-26 NOTE — Telephone Encounter (Signed)
Called [phone] and Kimberlyn has left for the day.

## 2014-07-26 NOTE — Telephone Encounter (Signed)
PATIENT HAS APPT IN January BUT HER SCRIPT WILL RUN OUT BY THEN AND IT IS REALLY NOT WORKING, IS THERE ANYTHING ELSE SHE CAN TRY BEFORE HER APPT.  PROTONIX DID NOT WORK AND NEXIUM IS TOO EXPENSIVE.  PLEASE ADVISE

## 2014-07-26 NOTE — Telephone Encounter (Signed)
I spoke to pt and she is aware Dr. Oneida Alar is out this week. She said OK to ask one of the extenders to recommend something different.  She is taking the esomeprazole 40 mg bid and is still having problems. Her reflux is better than it was last year. However, she has a bad taste and acid in her mouth in the morning. She is watching her diet   Please advise if she can try something different.

## 2014-07-27 ENCOUNTER — Ambulatory Visit (HOSPITAL_COMMUNITY): Payer: 59

## 2014-07-27 NOTE — Telephone Encounter (Signed)
Tried to call. Busy. 

## 2014-07-27 NOTE — Telephone Encounter (Signed)
Pt will try the Dexilant. She has tried once before, it did good for awhile and then not so good. She is willing to try and #20 samples of Dexilant 60 mg at front for pick up to take one daily.

## 2014-07-31 NOTE — Progress Notes (Signed)
[name] Bellow, MD [address] [place] [address]  Hx Breast cancer, IDC, Left, Stage II, receptor+, Her2 -  Gastroesophageal reflux disease, esophagitis presence not specified  Localized rash  Thinning hair  CURRENT THERAPY: Letrozole 2.5 mg daily representing extended adjuvant endocrine therapy.  INTERVAL HISTORY: [name] [name] 63 y.o. 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 Her date of surgery was 03/02/2005 with her initial biopsy. Cancer was ER positive 64%, PR positive at 97% HER-2/neu negative. Ki-67 marker was high at 52%. She received chemotherapy followed by radiation therapy followed by letrozole which she did not tolerate well. She was switched to tamoxifen and finished 5 years of therapy in May of 2012.  Extended anti-endocrine therapy was offered to the patient by Dr. Barnet Glasgow; she started Letrozole in November 2014.  I personally reviewed and went over laboratory results with the patient.  The results are noted within this dictation.  She is due for labs today for update and she refuses to have them done.  "I am having labs done soon by Dr. Karie Kirks and I do not want my cancer marker followed at this time because it makes me nervous."  She had to reschedule her CT of chest because of personnel changes in radiology which has been stressful for R[place].    I personally reviewed and went over radiographic studies with the patient.  The results are noted within this dictation.  Mammogram in July was good and she will be due in July 2016 for her annual mammogram.  She notes increased GERD symptoms and she has an upcoming appointment with Dr. Oneida Alar for further evaluation of that symptom.   She also notes some alopecia and she thinks it is either from her letrozole, dexilant, or her  increasing age.  Letrozole carries a 3-5% risk of alopecia whereas dexilant does not.  Therefore, this is likely from letrozole.   "How long do I need to keep taking Letrozole."  Per NCCN guidelines, it is recommended that an AI be taken for 2-3 years following 5 years of Tamoxifen.  This is the information I provided her today.  She notes a rash under her right mammary fold and she notes that it is occasionally pruritic.  She has been using baby lotion on this.  I recommended that she use Udder cream or VaniCream instead of baby lotion.  She may also lightly dust the area with fragrance free cornstarch to keep it dry.  On exam, it does not appear to have a fungal component, but if conservative treatment is ineffective, Lotrisone cream would be the next intervention.  Oncologically, she denies any complaints otherwise and ROS questioning is negative.   Past Medical History  Diagnosis Date  . Hypertension   . GERD (gastroesophageal reflux disease)   . Breast cancer     has Hx Breast cancer, IDC, Left, Stage II, receptor+, Her2 -; HEMORRHOIDS; GERD; Anemia; Dyspepsia; and Chest pain on her problem list.     is allergic to sulfur.  Ms. Sol does not currently have medications on file.  Past Surgical History  Procedure Laterality Date  . Esophagogastroduodenoscopy  2009    normal stomach,esophagues without mass,reosion  . Colonoscopy  2009    normal colon without evidence of polyps  . Breast lumpectomy  2006  Left  breast  . Breast lumpectomy  1994    Right breast-benign  . Givens capsule study  09/26/2011    Procedure: GIVENS CAPSULE STUDY;  Surgeon: Dorothyann Peng, MD;  [facility];  Service: Endoscopy;  Laterality: N/A;  . Ileocolonoscopy  09/17/2011    SLF: Nl colonoscopy WMO/INTERNA; HEMORRHOIDS/ NO SOURCE FOR IRON DEFICIENCY ANEMA IDENTIFIED  . Esophagogastroduodenoscopy  09/17/2011    SLF: Mild gastritis/NO SOURCE FOR IRON DEFICINECY ANEMIA IDENTIFIED  .  Esophagogastroduodenoscopy N/A 12/02/2012    Procedure: ESOPHAGOGASTRODUODENOSCOPY (EGD);  Surgeon: Danie Binder, MD;  Location[facility];  Service: Endoscopy;  Laterality: N/A;  8:30-moved to [place] notified pt     Denies any headaches, dizziness, double vision, fevers, chills, night sweats, nausea, vomiting, diarrhea, constipation, chest pain, heart palpitations, shortness of breath, blood in stool, black tarry stool, urinary pain, urinary burning, urinary frequency, hematuria.   PHYSICAL EXAMINATION  ECOG PERFORMANCE STATUS: 0 - Asymptomatic  Filed Vitals:   08/02/14 0900  BP: 160/79  Pulse: 109  Temp: 97.8 F (36.6 C)  Resp: 18    GENERAL:alert, no distress, well nourished, well developed, anxious, comfortable, cooperative and smiling SKIN: skin color, texture, turgor are normal, left inframammary fold erythematous gyrate pattern rash that is moist. HEAD: Normocephalic, No masses, lesions, tenderness or abnormalities EYES: normal, PERRLA, EOMI, Conjunctiva are pink and non-injected EARS: External ears normal OROPHARYNX:lips, buccal mucosa, and tongue normal and mucous membranes are moist  NECK: supple, no adenopathy, thyroid normal size, non-tender, without nodularity, no stridor, non-tender, trachea midline LYMPH:  no palpable lymphadenopathy BREAST:right breast normal without mass, skin or nipple changes or axillary nodes, left breast has inframammary fold rash, see skin exam above.  Left breast has thickened skin and radiation changes without obvious masses or lesions. LUNGS: clear to auscultation and percussion HEART: regular rate & rhythm, no murmurs, no gallops, S1 normal and S2 normal ABDOMEN:abdomen soft, non-tender, normal bowel sounds and no masses or organomegaly BACK: Back symmetric, no curvature. EXTREMITIES:less then 2 second capillary refill, no joint deformities, effusion, or inflammation, no edema, no skin discoloration, no clubbing, no cyanosis  NEURO:  alert & oriented x 3 with fluent speech, no focal motor/sensory deficits, gait normal   LABORATORY DATA: CBC    Component Value Date/Time   WBC 5.6 01/27/2014 0850   WBC 6.6 06/19/2006 1454   RBC 4.35 01/27/2014 0850   RBC 4.26 06/19/2006 1454   HGB 12.8 01/27/2014 0850   HGB 13.5 06/19/2006 1454   HCT 38.3 01/27/2014 0850   HCT 39.1 06/19/2006 1454   PLT 231 01/27/2014 0850   PLT 193 06/19/2006 1454   MCV 88.0 01/27/2014 0850   MCV 91.8 06/19/2006 1454   MCH 29.4 01/27/2014 0850   MCH 31.7 06/19/2006 1454   MCHC 33.4 01/27/2014 0850   MCHC 34.5 06/19/2006 1454   RDW 13.5 01/27/2014 0850   RDW 12.9 06/19/2006 1454   LYMPHSABS 1.1 01/27/2014 0850   LYMPHSABS 1.0 06/19/2006 1454   MONOABS 0.3 01/27/2014 0850   MONOABS 0.3 06/19/2006 1454   EOSABS 0.0 01/27/2014 0850   EOSABS 0.0 06/19/2006 1454   BASOSABS 0.1 01/27/2014 0850   BASOSABS 0.0 06/19/2006 1454      Chemistry      Component Value Date/Time   NA 139 01/27/2014 0850   K 3.5* 01/27/2014 0850   CL 100 01/27/2014 0850   CO2 25 01/27/2014 0850   BUN 13 01/27/2014 0850   CREATININE 0.74 01/27/2014  0850      Component Value Date/Time   CALCIUM 10.0 01/27/2014 0850   ALKPHOS 73 01/27/2014 0850   AST 20 01/27/2014 0850   ALT 9 01/27/2014 0850   BILITOT 0.9 01/27/2014 0850     Lab Results  Component Value Date   IRON 96 01/27/2014   TIBC 371 01/27/2014   FERRITIN 40 01/27/2014   Lab Results  Component Value Date   LABCA2 36 01/27/2014     ASSESSMENT: 1.  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 Her date of surgery was 03/02/2005 with her initial biopsy. Cancer was ER positive 64%, PR positive at 97% HER-2/neu negative. Ki-67 marker was high at 52%. She received chemotherapy followed by radiation therapy followed by letrozole which she did not tolerate well. She was switched to  tamoxifen and finished 5 years of therapy in May of 2012.  Extended anti-endocrine therapy was offered to the patient by Dr. Barnet Glasgow; she started Letrozole in November 2014. 2. Minimal left upper extremity lymphedema. 3. Gastroesophageal reflux disease, controlled. 4. Benign breast biopsy on the right in 1993. 5. Thinning of hair, letrozole-induced 6. Left inframammary rash 7. GERD, followed by Dr. Oneida Alar.  Patient Active Problem List   Diagnosis Date Noted  . Dyspepsia 11/12/2012  . Chest pain 11/12/2012  . Anemia 08/20/2012  . GERD 01/27/2009  . HEMORRHOIDS 01/26/2009  . Hx Breast cancer, IDC, Left, Stage II, receptor+, Her2 - 01/26/2005     PLAN:  1. I personally reviewed and went over laboratory results with the patient.  The results are noted within this dictation. 2. Next screening mammogram is due in July 2016 3. Patient refuses labs today: CBC diff, CMET, CA27.29 4. CT chest wo contrast on 09/28/2014 to establish stability of previous findings. 5. Continue Letrozole daily 6. Recommend VaniCream and cornstarch to left inframammary fold.  If ineffective, will try lotrisone cream. 7. Follow-up with Dr. Oneida Alar as directed. 8. Refill on Letrozole escribed. 9. Return in 6 months for follow-up   THERAPY PLAN:  She is to continue Letrozole daily and she will take for 1-2 more years per NCCN guidelines.  Hair thinning is likely Letrozole-induced with a 3-5% risk of alopecia with this medication (lowest risk compared to other AIs with Aromasin being as high as 15% risk).   All questions were answered. The patient knows to call the clinic with any problems, questions or concerns. We can certainly see the patient much sooner if necessary.  Patient and plan discussed with Dr. Farrel Gobble and he is in agreement with the aforementioned.   KEFALAS,THOMAS 08/02/2014

## 2014-08-02 ENCOUNTER — Encounter (HOSPITAL_COMMUNITY): Payer: 59 | Attending: Oncology | Admitting: Oncology

## 2014-08-02 ENCOUNTER — Encounter (HOSPITAL_COMMUNITY): Payer: Self-pay | Admitting: Oncology

## 2014-08-02 VITALS — BP 160/79 | HR 109 | Temp 97.8°F | Resp 18 | Wt 138.2 lb

## 2014-08-02 DIAGNOSIS — K219 Gastro-esophageal reflux disease without esophagitis: Secondary | ICD-10-CM

## 2014-08-02 DIAGNOSIS — L659 Nonscarring hair loss, unspecified: Secondary | ICD-10-CM

## 2014-08-02 DIAGNOSIS — R21 Rash and other nonspecific skin eruption: Secondary | ICD-10-CM

## 2014-08-02 DIAGNOSIS — C50912 Malignant neoplasm of unspecified site of left female breast: Secondary | ICD-10-CM

## 2014-08-02 DIAGNOSIS — Z853 Personal history of malignant neoplasm of breast: Secondary | ICD-10-CM

## 2014-08-02 DIAGNOSIS — Z170 Estrogen receptor positive status [ER+]: Secondary | ICD-10-CM

## 2014-08-02 MED ORDER — LETROZOLE 2.5 MG PO TABS: 2.5000 mg | ORAL_TABLET | Freq: Every day | ORAL | Status: DC

## 2014-08-02 NOTE — Patient Instructions (Addendum)
[facility] Discharge Instructions  RECOMMENDATIONS MADE BY THE CONSULTANT AND ANY TEST RESULTS WILL BE SENT TO YOUR REFERRING PHYSICIAN.  We will not perform lab work today at your request. CT of chest in Jan 2016. Mammogram due in July 2016. Continue Letrozole daily.  NCCN guidelines recommends 2-3 year of aromatase inhibitor after 5 years of Tamoxifen.  Letrozole has a 3-5% risk of alopecia (hair loss). You may use VaniCream or Udder cream and light dusting of cornstarch. Return in 6 months for follow-up.  Thank you for choosing [facility] to provide your oncology and hematology care.  To afford each patient quality time with our providers, please arrive at least 15 minutes before your scheduled appointment time.  With your help, our goal is to use those 15 minutes to complete the necessary work-up to ensure our physicians have the information they need to help with your evaluation and healthcare recommendations.    Effective January 1st, 2014, we ask that you re-schedule your appointment with our physicians should you arrive 10 or more minutes late for your appointment.  We strive to give you quality time with our providers, and arriving late affects you and other patients whose appointments are after yours.    Again, thank you for choosing [facility].  Our hope is that these requests will decrease the amount of time that you wait before being seen by our physicians.       _____________________________________________________________  Should you have questions after your visit to [facility], please contact our office at [phone] between the hours of 8:30 a.m. and 5:00 p.m.  Voicemails left after 4:30 p.m. will not be returned until the following business day.  For prescription refill requests, have your pharmacy contact our office with your prescription refill request.

## 2014-08-10 ENCOUNTER — Telehealth: Payer: Self-pay | Admitting: Gastroenterology

## 2014-08-10 NOTE — Telephone Encounter (Signed)
Samples of Dexilant 60 mg #15 at front. Tried to call x 2 and busy signal.

## 2014-08-10 NOTE — Telephone Encounter (Signed)
Patient called today asking if she could get some more Dexilant samples. She has 7 left and her OV with SF isn't until 1/6 and she was wanting to get enough to hold her until her appointment. Please advise [phone] (work)

## 2014-08-10 NOTE — Telephone Encounter (Signed)
Called. Many rings and no answer.  

## 2014-08-10 NOTE — Telephone Encounter (Signed)
Pt is aware samples are at front.

## 2014-09-08 ENCOUNTER — Telehealth: Payer: Self-pay

## 2014-09-08 ENCOUNTER — Ambulatory Visit (INDEPENDENT_AMBULATORY_CARE_PROVIDER_SITE_OTHER): Payer: 59 | Admitting: Gastroenterology

## 2014-09-08 ENCOUNTER — Encounter: Payer: Self-pay | Admitting: Gastroenterology

## 2014-09-08 VITALS — BP 142/82 | HR 118 | Temp 97.2°F | Ht 66.0 in | Wt 137.8 lb

## 2014-09-08 DIAGNOSIS — R1013 Epigastric pain: Secondary | ICD-10-CM

## 2014-09-08 DIAGNOSIS — K219 Gastro-esophageal reflux disease without esophagitis: Secondary | ICD-10-CM

## 2014-09-08 MED ORDER — ESOMEPRAZOLE MAGNESIUM 40 MG PO CPDR: DELAYED_RELEASE_CAPSULE | ORAL | Status: DC

## 2014-09-08 MED ORDER — BACLOFEN 10 MG PO TABS: ORAL_TABLET | ORAL | Status: DC

## 2014-09-08 NOTE — Patient Instructions (Signed)
ADD BACLOFEN 30 MINS PRIOR TO FIRST DRINK/MEAK FOR 7 DAYS THEN START TAKING A DOSE AT 4 AM AND  AT 10 AM. CALL ONE MONTH AFTER INCREASING BACLOFEN IF BAD TASTE NOT IMPROVED.  CONTINUE A LOW FAT DIET AND AVOIDING REFLUX TRIGGERS.  USE ESOMEPRAZOLE 30 MINS PRIOR TO MEALS TWICE DAILY.  USE MAALOX AS NEEDED.  FOLLOW UP IN 6 MOS.

## 2014-09-08 NOTE — Addendum Note (Signed)
Addended by: Danie Binder on: 09/08/2014 02:01 PM   Modules accepted: Orders

## 2014-09-08 NOTE — Telephone Encounter (Signed)
Pt called to see if she needed to stay on the Nexium because the price is going up for the Nexium. She is wanting to know if she could do something different of if she need to stay with Nexium.

## 2014-09-08 NOTE — Progress Notes (Signed)
ON RECALL LIST  °

## 2014-09-08 NOTE — Assessment & Plan Note (Signed)
Sx not ideally controlled.  ADD BACLOFEN 30 MINS PRIOR TO FIRST DRINK/MEAK FOR 7 DAYS THEN START TAKING A DOSE AT 4 AM AND  AT 10 AM. CALL ONE MONTH AFTER INCREASING BACLOFEN IF BAD TASTE NOT IMPROVED. PT DECLINED ENT REFERRAL, EGD/BRAVO, OR REFLUX SURGERY AT THIS TIME. LOW FAT DIET ESOMEPRAZOLE 30 MINS PRIOR TO MEALS BID FOLLOW UP IN 6 MOS.

## 2014-09-08 NOTE — Progress Notes (Signed)
cc'ed to pcp °

## 2014-09-08 NOTE — Progress Notes (Signed)
Subjective:    Patient ID: [name] [name], female    DOB: September 06, 1950, 64 y.o.   MRN: [id number]  [name], MD  HPI Bad taste in her mouth. Took saMples OF DEXILANT FOR 6 WEEKS-NO CHANGE. TOOK GENERIC FOR NEXIUM. WATCHING WHAT SHE EATS. THE REFLUX AND HEARTBURN NOT AS BAD AS IT WAS IN APR 2014. BITTER TASTE IN BACK OF MOUTH. DIDN'T USED TO HAVE IT. IT COMES AND GOES AND MAY STAY FOR A LONG TIME. HAS ALL DAY LONG AND TAKES MAALOX EQUIV AND IT GOES AWAY UNTIL NEXT  MORNING AND WHEN SHE GETS UP ITS BACK AGAIN. NEVER TRIED BACLOFEN BEFORE. NO TROUBLE WITH EMPTY FEELING. DOESN'T TAKE IRON ANYMORE. OCCASIONAL CONSTIPATION. DIARRHEA BETTER AFTER TAKING IRON AND STOPPING ACTIVIA. REG COFFE: 1 CUP QAM AND DECAF AT WORK.  PT DENIES FEVER, CHILLS, HEMATOCHEZIA, HEMATEMESIS, nausea, vomiting, melena, diarrhea, CHEST PAIN, SHORTNESS OF BREATH,  abdominal pain, problems swallowing, OR heartburn or indigestion.  Past Medical History  Diagnosis Date  . Hypertension   . GERD (gastroesophageal reflux disease)   . Breast cancer    Past Surgical History  Procedure Laterality Date  . Esophagogastroduodenoscopy  2009    normal stomach,esophagues without mass,reosion  . Colonoscopy  2009    normal colon without evidence of polyps  . Breast lumpectomy  2006    Left  breast  . Breast lumpectomy  1994    Right breast-benign  . Givens capsule study  09/26/2011    Procedure: GIVENS CAPSULE STUDY;  Surgeon: Dorothyann Peng, MD;  Location: [facility];  Service: Endoscopy;  Laterality: N/A;  . Ileocolonoscopy  09/17/2011    SLF: Nl colonoscopy WMO/INTERNA; HEMORRHOIDS/ NO SOURCE FOR IRON DEFICIENCY ANEMA IDENTIFIED  . Esophagogastroduodenoscopy  09/17/2011    SLF: Mild gastritis/NO SOURCE FOR IRON DEFICINECY ANEMIA IDENTIFIED  . Esophagogastroduodenoscopy N/A 12/02/2012    SLF: 1. No source for dyspepsia identified 2. MILD Non-erosive gastritis   Allergies  Allergen Reactions  . Sulfur Itching and  Rash   Current Outpatient Prescriptions  Medication Sig Dispense Refill  . Calcium Carb-Cholecalciferol (CALCIUM 1000 + D PO) Take 2 tablets by mouth 2 (two) times daily.      Marland Kitchen docusate sodium (COLACE) 100 MG capsule Take 200 mg by mouth at bedtime. AS NEEDED   . enalapril (VASOTEC) 10 MG tablet Take 10 mg by mouth daily.      Marland Kitchen esomeprazole (NEXIUM) 40 MG capsule 1 PO 30 MINS PRIOR TO MEALS    . letrozole (FEMARA) 2.5 MG tablet Take 1 tablet (2.5 mg total) by mouth daily.    Marland Kitchen LORazepam (ATIVAN) 0.5 MG tablet Take 0.5 mg by mouth at bedtime.    .      . diphenhydrAMINE (BENADRYL) 25 MG tablet Take 25 mg by mouth every 6 (six) hours as needed for allergies.    .         Review of Systems     Objective:   Physical Exam  Constitutional: She is oriented to person, place, and time. She appears well-developed and well-nourished. No distress.  HENT:  Head: Normocephalic and atraumatic.  Mouth/Throat: Oropharynx is clear and moist. No oropharyngeal exudate.  Eyes: Pupils are equal, round, and reactive to light. No scleral icterus.  Neck: Normal range of motion. Neck supple.  Cardiovascular: Normal rate, regular rhythm and normal heart sounds.   Pulmonary/Chest: Effort normal and breath sounds normal. No respiratory distress.  Abdominal: Soft. Bowel sounds are normal. She exhibits no  distension. There is no tenderness.  Musculoskeletal: She exhibits no edema.  Lymphadenopathy:    She has no cervical adenopathy.  Neurological: She is alert and oriented to person, place, and time.  NO  NEW FOCAL DEFICITS   Psychiatric: She has a normal mood and affect.  Vitals reviewed.         Assessment & Plan:

## 2014-09-09 MED ORDER — RABEPRAZOLE SODIUM 20 MG PO TBEC: DELAYED_RELEASE_TABLET | ORAL | Status: DC

## 2014-09-09 NOTE — Telephone Encounter (Signed)
PLEASE CALL PT. SHE HAS TRIED ALL OTHER PPIs EXCEPT ACIPHEX. I CAN SEND A RX FOR IT. SHE CAN TELL ME WHAT IS MORE AFFORDABLE.

## 2014-09-10 NOTE — Telephone Encounter (Signed)
Pt is aware and will call to check on the price and let us know if she need to change it

## 2014-09-28 ENCOUNTER — Ambulatory Visit (HOSPITAL_COMMUNITY): Payer: 59

## 2014-10-13 ENCOUNTER — Telehealth (HOSPITAL_COMMUNITY): Payer: Self-pay

## 2014-10-13 ENCOUNTER — Other Ambulatory Visit (HOSPITAL_COMMUNITY): Payer: Self-pay | Admitting: Oncology

## 2014-10-13 DIAGNOSIS — B3789 Other sites of candidiasis: Secondary | ICD-10-CM

## 2014-10-13 MED ORDER — CLOTRIMAZOLE-BETAMETHASONE 1-0.05 % EX CREA: 1.0000 "application " | TOPICAL_CREAM | Freq: Two times a day (BID) | CUTANEOUS | Status: DC

## 2014-10-13 NOTE — Telephone Encounter (Signed)
Patient notified regarding Lotrisone cream.

## 2014-10-13 NOTE — Telephone Encounter (Signed)
Lotrisone cream escribed to [facility].

## 2014-10-13 NOTE — Telephone Encounter (Signed)
Call from patient.  States "I've been using the Vani Cream to the rash on my chest and it's no longer working.  Tom said when I saw him that he could call in something for me to help with the itching.  Would appreciate something to be ordered if possible."  Uses [facility] pharmacy.

## 2014-10-14 ENCOUNTER — Ambulatory Visit (INDEPENDENT_AMBULATORY_CARE_PROVIDER_SITE_OTHER): Payer: 59 | Admitting: Otolaryngology

## 2014-10-14 DIAGNOSIS — H6983 Other specified disorders of Eustachian tube, bilateral: Secondary | ICD-10-CM

## 2014-10-14 DIAGNOSIS — H6121 Impacted cerumen, right ear: Secondary | ICD-10-CM

## 2014-10-28 ENCOUNTER — Other Ambulatory Visit (HOSPITAL_COMMUNITY): Payer: Self-pay | Admitting: Oncology

## 2014-11-11 ENCOUNTER — Ambulatory Visit (INDEPENDENT_AMBULATORY_CARE_PROVIDER_SITE_OTHER): Payer: 59 | Admitting: Otolaryngology

## 2014-11-11 DIAGNOSIS — H6981 Other specified disorders of Eustachian tube, right ear: Secondary | ICD-10-CM

## 2014-11-30 ENCOUNTER — Other Ambulatory Visit (HOSPITAL_COMMUNITY): Payer: Self-pay | Admitting: Oncology

## 2014-11-30 DIAGNOSIS — Z853 Personal history of malignant neoplasm of breast: Secondary | ICD-10-CM

## 2014-12-01 ENCOUNTER — Encounter (HOSPITAL_COMMUNITY): Payer: Self-pay

## 2014-12-01 ENCOUNTER — Ambulatory Visit (HOSPITAL_COMMUNITY)
Admission: RE | Admit: 2014-12-01 | Discharge: 2014-12-01 | Disposition: A | Discharge status: Home / Self Care | Payer: 59 | Source: Ambulatory Visit | Attending: Oncology | Admitting: Oncology

## 2014-12-01 DIAGNOSIS — Z853 Personal history of malignant neoplasm of breast: Secondary | ICD-10-CM | POA: Diagnosis present

## 2014-12-01 DIAGNOSIS — Z923 Personal history of irradiation: Secondary | ICD-10-CM | POA: Diagnosis not present

## 2014-12-01 DIAGNOSIS — Z9221 Personal history of antineoplastic chemotherapy: Secondary | ICD-10-CM | POA: Insufficient documentation

## 2014-12-01 LAB — POCT I-STAT CREATININE: Creatinine, Ser: 0.8 mg/dL (ref 0.50–1.10)

## 2014-12-01 MED ORDER — IOHEXOL 300 MG/ML  SOLN
80.0000 mL | Freq: Once | INTRAMUSCULAR | Status: AC | PRN
  Administered 2014-12-01: 80 mL via INTRAVENOUS

## 2014-12-09 ENCOUNTER — Telehealth (HOSPITAL_COMMUNITY): Payer: Self-pay

## 2014-12-09 NOTE — Telephone Encounter (Signed)
Call from patient with complaints of itching and redness below left breast.  Started out just itching and applied lotrisone cream to area then redness developed.  Instructed to stop usage of the Lotrisone cream, use light dusting of corn starch and to call office on Monday with update.  Verbalized understanding of instructions.

## 2015-01-20 ENCOUNTER — Encounter (HOSPITAL_COMMUNITY): Payer: 59 | Attending: Hematology & Oncology | Admitting: Hematology & Oncology

## 2015-01-20 ENCOUNTER — Encounter (HOSPITAL_COMMUNITY): Payer: Self-pay | Admitting: Hematology & Oncology

## 2015-01-20 VITALS — BP 144/76 | HR 89 | Temp 98.2°F | Resp 16 | Wt 138.4 lb

## 2015-01-20 DIAGNOSIS — Z853 Personal history of malignant neoplasm of breast: Secondary | ICD-10-CM

## 2015-01-20 DIAGNOSIS — Z79899 Other long term (current) drug therapy: Secondary | ICD-10-CM | POA: Diagnosis not present

## 2015-01-20 NOTE — Patient Instructions (Signed)
[place] at [facility] Discharge Instructions  RECOMMENDATIONS MADE BY THE CONSULTANT AND ANY TEST RESULTS WILL BE SENT TO YOUR REFERRING PHYSICIAN.  Exam and discussion by Dr. [name]. Will get a bone density and get a breast cancer index performed. Bone Density and mammogram to be scheduled. Report any new lumps, bone pain, shortness of breath or other symptoms.  Follow-up in 6 months.    Thank you for choosing [place] at [facility] to provide your oncology and hematology care.  To afford each patient quality time with our provider, please arrive at least 15 minutes before your scheduled appointment time.    You need to re-schedule your appointment should you arrive 10 or more minutes late.  We strive to give you quality time with our providers, and arriving late affects you and other patients whose appointments are after yours.  Also, if you no show three or more times for appointments you may be dismissed from the clinic at the providers discretion.     Again, thank you for choosing [facility].  Our hope is that these requests will decrease the amount of time that you wait before being seen by our physicians.       _____________________________________________________________  Should you have questions after your visit to [facility], please contact our office at [phone] between the hours of 8:30 a.m. and 4:30 p.m.  Voicemails left after 4:30 p.m. will not be returned until the following business day.  For prescription refill requests, have your pharmacy contact our office.

## 2015-01-20 NOTE — Progress Notes (Signed)
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 Ki-67 marker was high at 52% Did not tolerate letrozole, completed 5 years of tamoxifen in May 2012 Opted to retry letrozole for extended therapy which she started in November 2014  CURRENT THERAPY: surveillance   INTERVAL HISTORY: [name] [name] 64 y.o. female returns for follow up of a previously diagnosed breast cancer.  She schedules her own mammograms. Will schedule it at [facility].   She reports that her "left breast doesn't feel right".  She feels that the area around her lumpectomy "feels itchy inside". Recently she's been experiencing a sharp pain in that area as well. She feels that her sleeve isn't fitting properly. She is requesting a new prescription for a lymphedema sleeve.  MEDICAL HISTORY: Past Medical History  Diagnosis Date  . Hypertension   . GERD (gastroesophageal reflux disease)   . Breast cancer   . Functional dyspepsia MAR 2014    APR 2014: PT DECLINED BRAVO. PPIs-PROTOIX/PRILOSEC-->DEXILANT-->NEXIUM(TOO EXPENSIVE)    has Hx Breast cancer, IDC, Left, Stage II, receptor+, Her2 -; HEMORRHOIDS; GERD; Anemia; Dyspepsia; and Chest pain on her problem list.     is allergic to sulfur.  Current Outpatient Prescriptions on File Prior to Visit  Medication Sig Dispense Refill  . Calcium Carb-Cholecalciferol (CALCIUM 1000 + D PO) Take 2 tablets by mouth 2 (two) times daily.      . diphenhydrAMINE (BENADRYL) 25 MG tablet Take 25 mg by mouth every 6 (six) hours as needed for allergies.    Marland Kitchen enalapril (VASOTEC) 10 MG tablet Take 10 mg by mouth daily.      Marland Kitchen esomeprazole (NEXIUM) 40 MG capsule 1 PO 30 MINS PRIOR TO MEALS bid 62 capsule 11  . letrozole (FEMARA) 2.5 MG tablet Take 1 tablet (2.5 mg total) by mouth daily. 30  tablet 12  . LORazepam (ATIVAN) 0.5 MG tablet Take 0.5 mg by mouth at bedtime.    . baclofen (LIORESAL) 10 MG tablet 1 PO 30 MINUTES AROUND 4 AM THEN AFTER 7 DAYS START 1 PO AT 4 AM AND 10 AM. (Patient not taking: Reported on 01/20/2015) 60 tablet 11  . clotrimazole-betamethasone (LOTRISONE) cream Apply 1 application topically 2 (two) times daily. (Patient not taking: Reported on 01/20/2015) 30 g 1  . docusate sodium (COLACE) 100 MG capsule Take 200 mg by mouth at bedtime.    . RABEprazole (ACIPHEX) 20 MG tablet 1 PO EVERY MORNING WITH BREAKFAST (Patient not taking: Reported on 01/20/2015) 30 tablet 11   No current facility-administered medications on file prior to visit.     SURGICAL HISTORY: Past Surgical History  Procedure Laterality Date  . Esophagogastroduodenoscopy  2009    normal stomach,esophagues without mass,reosion  . Colonoscopy  2009    normal colon without evidence of polyps  . Breast lumpectomy  2006    Left  breast  . Breast lumpectomy  1994    Right breast-benign  . Givens capsule study  09/26/2011    Procedure: GIVENS CAPSULE STUDY;  Surgeon: Dorothyann Peng, MD;  Location: [facility];  Service: Endoscopy;  Laterality: N/A;  . Ileocolonoscopy  09/17/2011    SLF: Nl colonoscopy WMO/INTERNA; HEMORRHOIDS/ NO SOURCE FOR IRON DEFICIENCY ANEMA IDENTIFIED  . Esophagogastroduodenoscopy  09/17/2011  SLF: Mild gastritis/NO SOURCE FOR IRON DEFICINECY ANEMIA IDENTIFIED  . Esophagogastroduodenoscopy N/A 12/02/2012    SLF: 1. No source for dyspepsia identified 2. MILD Non-erosive gastritis    SOCIAL HISTORY: History   Social History  . Marital Status: Married    Spouse Name: N/A  . Number of Children: N/A  . Years of Education: N/A   Occupational History  . Not on file.   Social History Main Topics  . Smoking status: Never Smoker   . Smokeless tobacco: Never Used  . Alcohol Use: No  . Drug Use: No  . Sexual Activity: Not on file   Other Topics Concern  . Not on  file   Social History Narrative    FAMILY HISTORY: Family History  Problem Relation Age of Onset  . Anesthesia problems Neg Hx   . Hypotension Neg Hx   . Malignant hyperthermia Neg Hx   . Pseudochol deficiency Neg Hx     Review of Systems  Constitutional: Negative for fever, chills, weight loss, malaise/fatigue and diaphoresis.  HENT: Negative for ear pain, hearing loss and tinnitus.   Eyes: Negative for blurred vision, double vision, pain, discharge and redness.  Respiratory: Negative for cough, hemoptysis, sputum production, shortness of breath and wheezing.   Cardiovascular: Negative for chest pain, palpitations, orthopnea, leg swelling and PND.  Gastrointestinal: Negative for nausea, vomiting, abdominal pain, diarrhea, constipation, blood in stool and melena.  Genitourinary: Negative for dysuria and hematuria.  Musculoskeletal: Negative for myalgias.  Skin: Negative for rash.  Neurological: Negative for dizziness, tingling, weakness and headaches.  Endo/Heme/Allergies: Negative for environmental allergies. Does not bruise/bleed easily.  Psychiatric/Behavioral: Negative for depression, suicidal ideas, hallucinations and substance abuse. The patient is not nervous/anxious.     PHYSICAL EXAMINATION  ECOG PERFORMANCE STATUS: 1 - Symptomatic but completely ambulatory  Filed Vitals:   01/20/15 0947  BP: 144/76  Pulse: 89  Temp: 98.2 F (36.8 C)  Resp: 16    Physical Exam  Constitutional: She is oriented to person, place, and time and well-developed, well-nourished, and in no distress.  HENT:  Head: Normocephalic and atraumatic.  Nose: Nose normal.  Mouth/Throat: Oropharynx is clear and moist. No oropharyngeal exudate.  Eyes: Conjunctivae and EOM are normal. Pupils are equal, round, and reactive to light. Right eye exhibits no discharge. Left eye exhibits no discharge. No scleral icterus.  Neck: Normal range of motion. Neck supple. No tracheal deviation present. No  thyromegaly present.  Cardiovascular: Normal rate, regular rhythm and normal heart sounds.  Exam reveals no gallop and no friction rub.   No murmur heard. Pulmonary/Chest: Effort normal and breath sounds normal. She has no wheezes. She has no rales.  Left breast noticeably more lifted. also more swollen. Significant radiation changes in texture. There is angioectasia formation under inframammary fold. Can't  palpate normal breast tissue due to firmness. Right breast exam is unremarkable. Both axillae reveal no palpable adenopathy  Abdominal: Soft. Bowel sounds are normal. She exhibits no distension and no mass. There is no tenderness. There is no rebound and no guarding.  Musculoskeletal: Normal range of motion. She exhibits no edema.  Lymphadenopathy:    She has no cervical adenopathy.  Neurological: She is alert and oriented to person, place, and time. She has normal reflexes. No cranial nerve deficit. Gait normal. Coordination normal.  Skin: Skin is warm and dry. No rash noted.  Psychiatric: Mood, memory, affect and judgment normal.  Nursing note and vitals reviewed.   LABORATORY DATA:  CBC  Component Value Date/Time   WBC 5.6 01/27/2014 0850   WBC 6.6 06/19/2006 1454   RBC 4.35 01/27/2014 0850   RBC 4.26 06/19/2006 1454   HGB 12.8 01/27/2014 0850   HGB 13.5 06/19/2006 1454   HCT 38.3 01/27/2014 0850   HCT 39.1 06/19/2006 1454   PLT 231 01/27/2014 0850   PLT 193 06/19/2006 1454   MCV 88.0 01/27/2014 0850   MCV 91.8 06/19/2006 1454   MCH 29.4 01/27/2014 0850   MCH 31.7 06/19/2006 1454   MCHC 33.4 01/27/2014 0850   MCHC 34.5 06/19/2006 1454   RDW 13.5 01/27/2014 0850   RDW 12.9 06/19/2006 1454   LYMPHSABS 1.1 01/27/2014 0850   LYMPHSABS 1.0 06/19/2006 1454   MONOABS 0.3 01/27/2014 0850   MONOABS 0.3 06/19/2006 1454   EOSABS 0.0 01/27/2014 0850   EOSABS 0.0 06/19/2006 1454   BASOSABS 0.1 01/27/2014 0850   BASOSABS 0.0 06/19/2006 1454   CMP     Component Value  Date/Time   NA 139 01/27/2014 0850   K 3.5* 01/27/2014 0850   CL 100 01/27/2014 0850   CO2 25 01/27/2014 0850   GLUCOSE 103* 01/27/2014 0850   BUN 13 01/27/2014 0850   CREATININE 0.80 12/01/2014 0739   CALCIUM 10.0 01/27/2014 0850   PROT 7.8 01/27/2014 0850   ALBUMIN 4.2 01/27/2014 0850   AST 20 01/27/2014 0850   ALT 9 01/27/2014 0850   ALKPHOS 73 01/27/2014 0850   BILITOT 0.9 01/27/2014 0850   GFRNONAA 89* 01/27/2014 0850   GFRAA >90 01/27/2014 0850     RADIOGRAPHIC STUDIES:    CT CHEST WITH CONTRAST 12/01/14  TECHNIQUE: Multidetector CT imaging of the chest was performed during intravenous contrast administration.  CONTRAST: 2m OMNIPAQUE IOHEXOL 300 MG/ML SOLN  COMPARISON: Chest CT 06/01/2013.  FINDINGS: Mediastinum/Lymph Nodes: Heart size is normal. There is no significant pericardial fluid, thickening or pericardial calcification. No pathologically enlarged internal mammary, mediastinal or hilar lymph nodes. Esophagus is unremarkable in appearance. No axillary lymphadenopathy. Status post left axillary lymph node dissection.  Lungs/Pleura: Chronic architectural distortion and pleural parenchymal thickening in the apex of the left hemithorax is similar to prior examinations, most compatible with post radiation scarring. No suspicious appearing pulmonary nodules or masses are identified. No acute consolidative airspace disease. No pleural effusions.  Upper Abdomen: Numerous calcified gallstones in the gallbladder, measuring up to 1.8 cm in diameter.  Musculoskeletal/Soft Tissues: There are no aggressive appearing lytic or blastic lesions noted in the visualized portions of the skeleton.  IMPRESSION: 1. Status post bilateral breast lumpectomy and left axillary nodal dissection, with stable appearing postradiation scarring in the apex of the left hemithorax. No findings to suggest metastatic disease in the thorax on today's examination. 2.  Cholelithiasis.    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  Minimal left upper extremity lymphedema.  I reviewed her CT of the chest and reassured her that the findings on imaging are post radiation changes. She has a lot of radiation changes to the left breast which makes physical examination difficult but I have advised her that routine screening mammography is an excellent screening tool. I could not personally palpate any significant abnormalities on exam today.  She has questions about continuing her letrozole because of hair thinning. I will look at her original pathology and see if she qualifies for the breast cancer  index. I explained to her that the breast cancer index can help determine her benefit from prolonged endocrine therapy.  She is due for a DEXA and a mammogram we will order both of these for her today and keep her apprised of the results when available. I will also keep her apprised of the BCI result once available.  We will see her back in 6 months with laboratory studies sooner if needed. She refuses laboratory studies today.  I provided her with a prescription for a new lymphedema sleeve.  All questions were answered. The patient knows to call the clinic with any problems, questions or concerns. We can certainly see the patient much sooner if necessary. This note was electronically signed.  This document serves as a record of services personally performed by Ancil Linsey, MD. It was created on her behalf by Pearlie Oyster, a trained medical scribe. The creation of this record is based on the scribe's personal observations and the provider's statements to them. This document has been checked and approved by the attending provider.    I have reviewed the above documentation for accuracy and completeness, and I agree  with the above.  Kelby Fam. Dewain Platz MD

## 2015-01-24 ENCOUNTER — Encounter (HOSPITAL_COMMUNITY): Payer: Self-pay | Admitting: Hematology & Oncology

## 2015-01-26 ENCOUNTER — Other Ambulatory Visit (HOSPITAL_COMMUNITY): Payer: 59

## 2015-02-01 ENCOUNTER — Ambulatory Visit (HOSPITAL_COMMUNITY): Payer: 59 | Admitting: Hematology & Oncology

## 2015-02-02 ENCOUNTER — Encounter (HOSPITAL_COMMUNITY): Payer: Self-pay

## 2015-02-15 ENCOUNTER — Encounter: Payer: Self-pay | Admitting: Gastroenterology

## 2015-02-21 ENCOUNTER — Other Ambulatory Visit (HOSPITAL_COMMUNITY): Payer: Self-pay | Admitting: Hematology & Oncology

## 2015-02-21 DIAGNOSIS — N644 Mastodynia: Secondary | ICD-10-CM

## 2015-02-21 DIAGNOSIS — Z853 Personal history of malignant neoplasm of breast: Secondary | ICD-10-CM

## 2015-02-21 DIAGNOSIS — Z79899 Other long term (current) drug therapy: Secondary | ICD-10-CM

## 2015-02-22 ENCOUNTER — Ambulatory Visit (HOSPITAL_COMMUNITY)
Admission: RE | Admit: 2015-02-22 | Discharge: 2015-02-22 | Disposition: A | Discharge status: Home / Self Care | Payer: 59 | Source: Ambulatory Visit | Attending: Hematology & Oncology | Admitting: Hematology & Oncology

## 2015-02-22 ENCOUNTER — Encounter (HOSPITAL_COMMUNITY): Payer: 59

## 2015-02-22 ENCOUNTER — Other Ambulatory Visit (HOSPITAL_COMMUNITY): Payer: Self-pay | Admitting: Hematology & Oncology

## 2015-02-22 DIAGNOSIS — Z79899 Other long term (current) drug therapy: Secondary | ICD-10-CM

## 2015-02-22 DIAGNOSIS — Z853 Personal history of malignant neoplasm of breast: Secondary | ICD-10-CM | POA: Insufficient documentation

## 2015-02-22 DIAGNOSIS — N644 Mastodynia: Secondary | ICD-10-CM

## 2015-02-22 DIAGNOSIS — Z9221 Personal history of antineoplastic chemotherapy: Secondary | ICD-10-CM | POA: Diagnosis not present

## 2015-02-22 DIAGNOSIS — R928 Other abnormal and inconclusive findings on diagnostic imaging of breast: Secondary | ICD-10-CM

## 2015-03-02 ENCOUNTER — Ambulatory Visit
Admission: RE | Admit: 2015-03-02 | Discharge: 2015-03-02 | Disposition: A | Discharge status: Home / Self Care | Payer: 59 | Source: Ambulatory Visit | Attending: Hematology & Oncology | Admitting: Hematology & Oncology

## 2015-03-02 DIAGNOSIS — R928 Other abnormal and inconclusive findings on diagnostic imaging of breast: Secondary | ICD-10-CM

## 2015-03-03 ENCOUNTER — Other Ambulatory Visit (HOSPITAL_COMMUNITY): Payer: Self-pay | Admitting: Hematology & Oncology

## 2015-03-03 DIAGNOSIS — R9389 Abnormal findings on diagnostic imaging of other specified body structures: Secondary | ICD-10-CM

## 2015-03-03 DIAGNOSIS — Z853 Personal history of malignant neoplasm of breast: Secondary | ICD-10-CM

## 2015-03-04 ENCOUNTER — Telehealth (HOSPITAL_COMMUNITY): Payer: Self-pay

## 2015-03-04 NOTE — Telephone Encounter (Signed)
Patient aware.

## 2015-03-04 NOTE — Telephone Encounter (Signed)
-----   Message from Patrici Ranks, MD sent at 03/03/2015  6:17 PM EDT -----  ADvise patient I have ordered a breast MRI. Requested by breast radiologist... Please advise of appointment day/time. Thanks Dr. Mamie Nick

## 2015-03-09 ENCOUNTER — Ambulatory Visit (HOSPITAL_COMMUNITY)
Admission: RE | Admit: 2015-03-09 | Discharge: 2015-03-09 | Disposition: A | Discharge status: Home / Self Care | Payer: 59 | Source: Ambulatory Visit | Attending: Hematology & Oncology | Admitting: Hematology & Oncology

## 2015-03-09 DIAGNOSIS — Z923 Personal history of irradiation: Secondary | ICD-10-CM | POA: Diagnosis not present

## 2015-03-09 DIAGNOSIS — Z9221 Personal history of antineoplastic chemotherapy: Secondary | ICD-10-CM | POA: Insufficient documentation

## 2015-03-09 DIAGNOSIS — N6489 Other specified disorders of breast: Secondary | ICD-10-CM | POA: Diagnosis present

## 2015-03-09 DIAGNOSIS — Z853 Personal history of malignant neoplasm of breast: Secondary | ICD-10-CM | POA: Diagnosis not present

## 2015-03-09 DIAGNOSIS — R9389 Abnormal findings on diagnostic imaging of other specified body structures: Secondary | ICD-10-CM

## 2015-03-09 LAB — POCT I-STAT CREATININE: CREATININE: 0.7 mg/dL (ref 0.44–1.00)

## 2015-03-09 MED ORDER — GADOBENATE DIMEGLUMINE 529 MG/ML IV SOLN
15.0000 mL | Freq: Once | INTRAVENOUS | Status: AC | PRN
  Administered 2015-03-09: 15 mL via INTRAVENOUS

## 2015-03-14 ENCOUNTER — Other Ambulatory Visit (HOSPITAL_COMMUNITY): Payer: 59

## 2015-03-21 ENCOUNTER — Telehealth (HOSPITAL_COMMUNITY): Payer: Self-pay | Admitting: *Deleted

## 2015-03-21 NOTE — Telephone Encounter (Signed)
[place] notified of her BCI results and that she needs to continue taking her medication (Femara) because the test results indicated that she would benefit from extending her Femara therapy. She states that this medication is making her hair thin and wonders if there is any other drug she could take that may not cause that side effect. Will send this to Dr. [name] for review.

## 2015-03-23 ENCOUNTER — Other Ambulatory Visit (HOSPITAL_COMMUNITY)
Admission: RE | Admit: 2015-03-23 | Discharge: 2015-03-23 | Disposition: A | Discharge status: Home / Self Care | Payer: 59 | Source: Ambulatory Visit | Attending: Family Medicine | Admitting: Family Medicine

## 2015-03-23 DIAGNOSIS — R799 Abnormal finding of blood chemistry, unspecified: Secondary | ICD-10-CM | POA: Diagnosis not present

## 2015-03-23 DIAGNOSIS — E780 Pure hypercholesterolemia: Secondary | ICD-10-CM | POA: Diagnosis not present

## 2015-03-23 LAB — LIPID PANEL
CHOLESTEROL: 302 mg/dL — AB (ref 0–200)
HDL: 79 mg/dL (ref >40)
LDL CALC: 197 mg/dL — AB (ref 0–99)
Total CHOL/HDL Ratio: 3.8 RATIO
Triglycerides: 132 mg/dL (ref <150)
VLDL: 26 mg/dL (ref 0–40)

## 2015-07-20 ENCOUNTER — Other Ambulatory Visit (HOSPITAL_COMMUNITY)
Admission: RE | Admit: 2015-07-20 | Discharge: 2015-07-20 | Disposition: A | Discharge status: Home / Self Care | Payer: 59 | Source: Ambulatory Visit | Attending: Family Medicine | Admitting: Family Medicine

## 2015-07-20 DIAGNOSIS — Z029 Encounter for administrative examinations, unspecified: Secondary | ICD-10-CM | POA: Diagnosis present

## 2015-07-20 LAB — LIPID PANEL
Cholesterol: 177 mg/dL (ref 0–200)
HDL: 82 mg/dL (ref >40)
LDL Cholesterol: 78 mg/dL (ref 0–99)
Total CHOL/HDL Ratio: 2.2 RATIO
Triglycerides: 83 mg/dL (ref <150)
VLDL: 17 mg/dL (ref 0–40)

## 2015-07-20 LAB — HEMOGLOBIN: HEMOGLOBIN: 14 g/dL (ref 12.0–15.0)

## 2015-07-22 ENCOUNTER — Ambulatory Visit (HOSPITAL_COMMUNITY): Payer: 59 | Admitting: Hematology & Oncology

## 2015-08-08 NOTE — Progress Notes (Signed)
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 Ki-67 marker was high at 52% Did not tolerate letrozole, completed 5 years of tamoxifen in May 2012 Opted to retry letrozole for extended therapy which she started in November 2014 R breast biopsy on 6/29/2/016 with fibrocystic changes, no evidence of malignancy  CURRENT THERAPY: surveillance   INTERVAL HISTORY: [name] [name] 64 y.o. female returns for follow up of a previously diagnosed breast cancer.  She schedules her own mammograms. Will schedule it at [facility]   She really has no complaints.  She underwent a R breast biopsy on 6/29, final pathology showed fibrocystic changes without evidence of malignancy. She ultimately underwent a breast MRI and is to have another screening mammogram in June.  She is due for a DEXA and now that it is at [facility] will be scheduling it.  She notes she still walks however her sister is in the [facility] and she has been spending time with her. She has cut back on her walking routine some. She continues on calcium and vitamin D. She takes her AI daily. Denies joint pain or hot flashes.  She still notices hair thinning but does not think it has gotten significantly worse since her last visit.  MEDICAL HISTORY: Past Medical History  Diagnosis Date  . Hypertension   . GERD (gastroesophageal reflux disease)   . Breast cancer   . Functional dyspepsia MAR 2014    APR 2014: PT DECLINED BRAVO. PPIs-PROTOIX/PRILOSEC-->DEXILANT-->NEXIUM(TOO EXPENSIVE)    has Hx Breast cancer, IDC, Left, Stage II, receptor+, Her2 -; HEMORRHOIDS; GERD; Anemia; Dyspepsia; and Chest pain on her problem list.     is allergic to sulfur.  Current Outpatient Prescriptions on File Prior to Visit  Medication Sig Dispense  Refill  . baclofen (LIORESAL) 10 MG tablet 1 PO 30 MINUTES AROUND 4 AM THEN AFTER 7 DAYS START 1 PO AT 4 AM AND 10 AM. (Patient not taking: Reported on 01/20/2015) 60 tablet 11  . Calcium Carb-Cholecalciferol (CALCIUM 1000 + D PO) Take 2 tablets by mouth 2 (two) times daily.      . clotrimazole-betamethasone (LOTRISONE) cream Apply 1 application topically 2 (two) times daily. (Patient not taking: Reported on 01/20/2015) 30 g 1  . diphenhydrAMINE (BENADRYL) 25 MG tablet Take 25 mg by mouth every 6 (six) hours as needed for allergies.    Marland Kitchen docusate sodium (COLACE) 100 MG capsule Take 200 mg by mouth at bedtime.    . enalapril (VASOTEC) 10 MG tablet Take 10 mg by mouth daily.      Marland Kitchen esomeprazole (NEXIUM) 40 MG capsule 1 PO 30 MINS PRIOR TO MEALS bid 62 capsule 11  . letrozole (FEMARA) 2.5 MG tablet Take 1 tablet (2.5 mg total) by mouth daily. 30 tablet 12  . LORazepam (ATIVAN) 0.5 MG tablet Take 0.5 mg by mouth at bedtime.    . RABEprazole (ACIPHEX) 20 MG tablet 1 PO EVERY MORNING WITH BREAKFAST (Patient not taking: Reported on 01/20/2015) 30 tablet 11   No current facility-administered medications on file prior to visit.     SURGICAL HISTORY: Past Surgical History  Procedure Laterality Date  . Esophagogastroduodenoscopy  2009    normal stomach,esophagues without mass,reosion  . Colonoscopy  2009    normal  colon without evidence of polyps  . Breast lumpectomy  2006    Left  breast  . Breast lumpectomy  1994    Right breast-benign  . Givens capsule study  09/26/2011    Procedure: GIVENS CAPSULE STUDY;  Surgeon: Dorothyann Peng, MD;  Location: [facility];  Service: Endoscopy;  Laterality: N/A;  . Ileocolonoscopy  09/17/2011    SLF: Nl colonoscopy WMO/INTERNA; HEMORRHOIDS/ NO SOURCE FOR IRON DEFICIENCY ANEMA IDENTIFIED  . Esophagogastroduodenoscopy  09/17/2011    SLF: Mild gastritis/NO SOURCE FOR IRON DEFICINECY ANEMIA IDENTIFIED  . Esophagogastroduodenoscopy N/A 12/02/2012    SLF: 1. No  source for dyspepsia identified 2. MILD Non-erosive gastritis    SOCIAL HISTORY: Social History   Social History  . Marital Status: Married    Spouse Name: N/A  . Number of Children: N/A  . Years of Education: N/A   Occupational History  . Not on file.   Social History Main Topics  . Smoking status: Never Smoker   . Smokeless tobacco: Never Used  . Alcohol Use: No  . Drug Use: No  . Sexual Activity: Not on file   Other Topics Concern  . Not on file   Social History Narrative    FAMILY HISTORY: Family History  Problem Relation Age of Onset  . Anesthesia problems Neg Hx   . Hypotension Neg Hx   . Malignant hyperthermia Neg Hx   . Pseudochol deficiency Neg Hx     Review of Systems  Constitutional: Negative for fever, chills, weight loss, malaise/fatigue and diaphoresis.  HENT: Negative for ear pain, hearing loss and tinnitus.   Eyes: Negative for blurred vision, double vision, pain, discharge and redness.  Respiratory: Negative for cough, hemoptysis, sputum production, shortness of breath and wheezing.   Cardiovascular: Negative for chest pain, palpitations, orthopnea, leg swelling and PND.  Gastrointestinal: Negative for nausea, vomiting, abdominal pain, diarrhea, constipation, blood in stool and melena.  Genitourinary: Negative for dysuria and hematuria.  Musculoskeletal: Negative for myalgias.  Skin: Negative for rash.  Neurological: Negative for dizziness, tingling, weakness and headaches.  Endo/Heme/Allergies: Negative for environmental allergies. Does not bruise/bleed easily.  Psychiatric/Behavioral: Negative for depression, suicidal ideas, hallucinations and substance abuse. The patient is not nervous/anxious.   14 point review of systems was performed and is negative except as detailed under history of present illness and above   PHYSICAL EXAMINATION  ECOG PERFORMANCE STATUS: 1 - Symptomatic but completely ambulatory  There were no vitals filed for this  visit.  Physical Exam  Constitutional: She is oriented to person, place, and time and well-developed, well-nourished, and in no distress.  HENT:  Head: Normocephalic and atraumatic.  Nose: Nose normal.  Mouth/Throat: Oropharynx is clear and moist. No oropharyngeal exudate.  Eyes: Conjunctivae and EOM are normal. Pupils are equal, round, and reactive to light. Right eye exhibits no discharge. Left eye exhibits no discharge. No scleral icterus.  Neck: Normal range of motion. Neck supple. No tracheal deviation present. No thyromegaly present.  Cardiovascular: Normal rate, regular rhythm and normal heart sounds.  Exam reveals no gallop and no friction rub.   No murmur heard. Pulmonary/Chest: Effort normal and breath sounds normal. She has no wheezes. She has no rales.  Left breast noticeably more lifted. also more swollen. Significant radiation changes in texture. There is angioectasia formation under inframammary fold. Can't  palpate normal breast tissue due to firmness. Right breast exam is unremarkable. Both axillae reveal no palpable adenopathy  Abdominal: Soft. Bowel sounds are normal.  She exhibits no distension and no mass. There is no tenderness. There is no rebound and no guarding.  Musculoskeletal: Normal range of motion. She exhibits no edema.  Lymphadenopathy:    She has no cervical adenopathy.  Neurological: She is alert and oriented to person, place, and time. She has normal reflexes. No cranial nerve deficit. Gait normal. Coordination normal.  Skin: Skin is warm and dry. No rash noted.  Psychiatric: Mood, memory, affect and judgment normal.  Nursing note and vitals reviewed.   LABORATORY DATA: I have reviewed the data as listed. CBC    Component Value Date/Time   WBC 5.6 01/27/2014 0850   WBC 6.6 06/19/2006 1454   RBC 4.35 01/27/2014 0850   RBC 4.26 06/19/2006 1454   HGB 14.0 07/20/2015 0745   HGB 13.5 06/19/2006 1454   HCT 38.3 01/27/2014 0850   HCT 39.1 06/19/2006 1454    PLT 231 01/27/2014 0850   PLT 193 06/19/2006 1454   MCV 88.0 01/27/2014 0850   MCV 91.8 06/19/2006 1454   MCH 29.4 01/27/2014 0850   MCH 31.7 06/19/2006 1454   MCHC 33.4 01/27/2014 0850   MCHC 34.5 06/19/2006 1454   RDW 13.5 01/27/2014 0850   RDW 12.9 06/19/2006 1454   LYMPHSABS 1.1 01/27/2014 0850   LYMPHSABS 1.0 06/19/2006 1454   MONOABS 0.3 01/27/2014 0850   MONOABS 0.3 06/19/2006 1454   EOSABS 0.0 01/27/2014 0850   EOSABS 0.0 06/19/2006 1454   BASOSABS 0.1 01/27/2014 0850   BASOSABS 0.0 06/19/2006 1454   CMP     Component Value Date/Time   NA 139 01/27/2014 0850   K 3.5* 01/27/2014 0850   CL 100 01/27/2014 0850   CO2 25 01/27/2014 0850   GLUCOSE 103* 01/27/2014 0850   BUN 13 01/27/2014 0850   CREATININE 0.70 03/09/2015 1146   CALCIUM 10.0 01/27/2014 0850   PROT 7.8 01/27/2014 0850   ALBUMIN 4.2 01/27/2014 0850   AST 20 01/27/2014 0850   ALT 9 01/27/2014 0850   ALKPHOS 73 01/27/2014 0850   BILITOT 0.9 01/27/2014 0850   GFRNONAA 89* 01/27/2014 0850   GFRAA >90 01/27/2014 0850   PATHOLOGY:    RADIOGRAPHIC STUDIES: I have personally reviewed the radiological images as listed and agreed with the findings in the report.   CLINICAL DATA: 64 year old female with distortion in the inner right breast identified mammographically with recent biopsy in this area demonstrating only fibrocystic changes. The patient has a history of remote excisional biopsy of the lower right breast and history of left breast cancer, left lumpectomy and radiation/chemotherapy.  LABS: Not obtained today.  EXAM: BILATERAL BREAST MRI WITH AND WITHOUT CONTRAST  TECHNIQUE: Multiplanar, multisequence MR images of both breasts were obtained prior to and following the intravenous administration of 15 ml of MultiHance.  THREE-DIMENSIONAL MR IMAGE RENDERING ON INDEPENDENT WORKSTATION:  Three-dimensional MR images were rendered by post-processing of the original MR data on an  independent workstation. The three-dimensional MR images were interpreted, and findings are reported in the following complete MRI report for this study. Three dimensional images were evaluated at the independent DynaCad workstation  COMPARISON: Multiple prior mammograms and MRs.  FINDINGS: Breast composition: d. Extreme fibroglandular tissue.  Background parenchymal enhancement: Mild  Right breast: Post biopsy changes within the inner lower right breast are identified. No areas of enhancement are identified to suggest breast malignancy. Possible minimal distortion within the lower right breast is again identified, but appears unchanged mammographically dating back to 2007.  Left breast: Left  breast scarring is again identified. No mass or abnormal enhancement identified.  Lymph nodes: No abnormal appearing lymph nodes.  Ancillary findings: None.  IMPRESSION: No MR evidence of breast malignancy bilaterally.  Possible minimal distortion in within the lower right breast has a similar appearance to remote studies and is felt to be compatible with postsurgical scarring.  Left breast scarring.  RECOMMENDATION: Bilateral screening mammograms in 1 year.  BI-RADS CATEGORY 2: Benign.   Electronically Signed  By: Margarette Canada M.D.  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  Minimal left upper extremity lymphedema.  She is doing well. She will schedule her DEXA and mammogram. I will keep her apprised of the results of her DEXA when available.  She is to continue on calcium and vitamin D. I have encouraged her to continue walking.  She will continue on her AI.  If she feels her hair thinning is not acceptable we discussed going back on tamoxifen as one option.  We will see her back  in 6 months with repeat physical exam.  I provided her with a prescription for a new lymphedema sleeve.   All questions were answered. The patient knows to call the clinic with any problems, questions or concerns. We can certainly see the patient much sooner if necessary. This note was electronically signed.  This document serves as a record of services personally performed by Ancil Linsey, MD. It was created on her behalf by Arlyce Harman, a trained medical scribe. The creation of this record is based on the scribe's personal observations and the provider's statements to them. This document has been checked and approved by the attending provider.   I have reviewed the above documentation for accuracy and completeness, and I agree with the above.  Kelby Fam. Federica Allport MD

## 2015-08-09 ENCOUNTER — Encounter (HOSPITAL_COMMUNITY): Payer: Self-pay | Admitting: Hematology & Oncology

## 2015-08-09 ENCOUNTER — Encounter (HOSPITAL_COMMUNITY): Payer: 59 | Attending: Hematology & Oncology | Admitting: Hematology & Oncology

## 2015-08-09 VITALS — BP 136/78 | HR 79 | Temp 97.8°F | Resp 15 | Wt 135.8 lb

## 2015-08-09 DIAGNOSIS — C50912 Malignant neoplasm of unspecified site of left female breast: Secondary | ICD-10-CM

## 2015-08-09 DIAGNOSIS — L659 Nonscarring hair loss, unspecified: Secondary | ICD-10-CM | POA: Diagnosis not present

## 2015-08-09 DIAGNOSIS — I890 Lymphedema, not elsewhere classified: Secondary | ICD-10-CM

## 2015-08-09 DIAGNOSIS — Z853 Personal history of malignant neoplasm of breast: Secondary | ICD-10-CM

## 2015-08-09 MED ORDER — LETROZOLE 2.5 MG PO TABS: 2.5000 mg | ORAL_TABLET | Freq: Every day | ORAL | Status: DC

## 2015-08-09 NOTE — Patient Instructions (Addendum)
[place] at [facility] Discharge Instructions  RECOMMENDATIONS MADE BY THE CONSULTANT AND ANY TEST RESULTS WILL BE SENT TO YOUR REFERRING PHYSICIAN.   Exam completed by Dr [name] today Screening mammogram due in June Take care of getting bone scan Sleeve prescription given Refill sent for letrozole Let us know if your hair keeps thinning and if you would want to change your letrozole  Return to see the doctor in 6 months Please call the clinic if you have any questions or concerns    Thank you for choosing [place] at [facility] to provide your oncology and hematology care.  To afford each patient quality time with our provider, please arrive at least 15 minutes before your scheduled appointment time.    You need to re-schedule your appointment should you arrive 10 or more minutes late.  We strive to give you quality time with our providers, and arriving late affects you and other patients whose appointments are after yours.  Also, if you no show three or more times for appointments you may be dismissed from the clinic at the providers discretion.     Again, thank you for choosing [facility].  Our hope is that these requests will decrease the amount of time that you wait before being seen by our physicians.       _____________________________________________________________  Should you have questions after your visit to [facility], please contact our office at [phone] between the hours of 8:30 a.m. and 4:30 p.m.  Voicemails left after 4:30 p.m. will not be returned until the following business day.  For prescription refill requests, have your pharmacy contact our office.

## 2015-08-19 NOTE — Progress Notes (Signed)
This encounter was created in error - please disregard.

## 2015-09-22 ENCOUNTER — Other Ambulatory Visit (HOSPITAL_COMMUNITY)
Admission: RE | Admit: 2015-09-22 | Discharge: 2015-09-22 | Disposition: A | Discharge status: Home / Self Care | Payer: 59 | Source: Ambulatory Visit | Attending: Family Medicine | Admitting: Family Medicine

## 2015-09-22 DIAGNOSIS — D509 Iron deficiency anemia, unspecified: Secondary | ICD-10-CM | POA: Diagnosis not present

## 2015-09-22 LAB — COMPREHENSIVE METABOLIC PANEL
ALT: 22 U/L (ref 14–54)
AST: 29 U/L (ref 15–41)
Albumin: 4.7 g/dL (ref 3.5–5.0)
Alkaline Phosphatase: 69 U/L (ref 38–126)
Anion gap: 7 (ref 5–15)
BUN: 13 mg/dL (ref 6–20)
CHLORIDE: 104 mmol/L (ref 101–111)
CO2: 30 mmol/L (ref 22–32)
Calcium: 9.9 mg/dL (ref 8.9–10.3)
Creatinine, Ser: 0.83 mg/dL (ref 0.44–1.00)
GFR calc Af Amer: >60 mL/min (ref >60)
GFR calc non Af Amer: >60 mL/min (ref >60)
Glucose, Bld: 99 mg/dL (ref 65–99)
POTASSIUM: 4 mmol/L (ref 3.5–5.1)
SODIUM: 141 mmol/L (ref 135–145)
Total Bilirubin: 1.2 mg/dL (ref 0.3–1.2)
Total Protein: 7.9 g/dL (ref 6.5–8.1)

## 2015-09-22 LAB — CBC WITH DIFFERENTIAL/PLATELET
Basophils Absolute: 0.1 10*3/uL (ref 0.0–0.1)
Basophils Relative: 1 %
EOS PCT: 1 %
Eosinophils Absolute: 0 10*3/uL (ref 0.0–0.7)
HCT: 41.5 % (ref 36.0–46.0)
Hemoglobin: 13.8 g/dL (ref 12.0–15.0)
LYMPHS ABS: 1.5 10*3/uL (ref 0.7–4.0)
Lymphocytes Relative: 24 %
MCH: 30.3 pg (ref 26.0–34.0)
MCHC: 33.3 g/dL (ref 30.0–36.0)
MCV: 91.2 fL (ref 78.0–100.0)
MONOS PCT: 6 %
Monocytes Absolute: 0.4 10*3/uL (ref 0.1–1.0)
NEUTROS PCT: 68 %
Neutro Abs: 4.3 10*3/uL (ref 1.7–7.7)
Platelets: 221 10*3/uL (ref 150–400)
RBC: 4.55 MIL/uL (ref 3.87–5.11)
RDW: 13.1 % (ref 11.5–15.5)
WBC: 6.2 10*3/uL (ref 4.0–10.5)

## 2015-09-26 ENCOUNTER — Encounter (HOSPITAL_COMMUNITY): Payer: Self-pay | Admitting: *Deleted

## 2015-09-26 NOTE — Progress Notes (Unsigned)
The BCI is being used to determine if the patient will benefit from extending her AI therapy or stopping her AI therapy after 5 years.

## 2015-09-27 DIAGNOSIS — Z0000 Encounter for general adult medical examination without abnormal findings: Secondary | ICD-10-CM | POA: Diagnosis not present

## 2015-09-27 DIAGNOSIS — G4700 Insomnia, unspecified: Secondary | ICD-10-CM | POA: Diagnosis not present

## 2015-09-27 DIAGNOSIS — Z8711 Personal history of peptic ulcer disease: Secondary | ICD-10-CM | POA: Diagnosis not present

## 2015-09-27 DIAGNOSIS — E7800 Pure hypercholesterolemia, unspecified: Secondary | ICD-10-CM | POA: Diagnosis not present

## 2015-09-27 MED FILL — LORazepam 0.5 MG TABS: 0.5 | 30 days supply | Qty: 90 | Fill #0

## 2015-09-27 MED FILL — ESOMEPRAZOLE MAG DR 40 MG C: 40 | 90 days supply | Qty: 180 | Fill #0

## 2015-10-03 MED FILL — ATORVASTATIN 40 MG TABLET: 40 | 90 days supply | Qty: 90 | Fill #2

## 2015-10-24 MED FILL — VALACYCLOVIR HCL 500 MG TAB: 500 | 30 days supply | Qty: 30 | Fill #2

## 2015-10-24 MED FILL — ENALAPRIL MALEATE 10 MG TAB: 10 | 90 days supply | Qty: 90 | Fill #1

## 2015-10-28 DIAGNOSIS — H5203 Hypermetropia, bilateral: Secondary | ICD-10-CM | POA: Diagnosis not present

## 2015-10-28 DIAGNOSIS — H524 Presbyopia: Secondary | ICD-10-CM | POA: Diagnosis not present

## 2015-10-28 DIAGNOSIS — H52223 Regular astigmatism, bilateral: Secondary | ICD-10-CM | POA: Diagnosis not present

## 2015-11-17 DIAGNOSIS — L723 Sebaceous cyst: Secondary | ICD-10-CM | POA: Diagnosis not present

## 2015-11-17 DIAGNOSIS — D225 Melanocytic nevi of trunk: Secondary | ICD-10-CM | POA: Diagnosis not present

## 2015-11-28 MED FILL — LETROZOLE 2.5 MG TABLET: 2.5 | 90 days supply | Qty: 90 | Fill #1

## 2015-12-08 ENCOUNTER — Other Ambulatory Visit (HOSPITAL_COMMUNITY): Payer: Self-pay | Admitting: Hematology & Oncology

## 2015-12-08 DIAGNOSIS — Z853 Personal history of malignant neoplasm of breast: Secondary | ICD-10-CM

## 2015-12-08 DIAGNOSIS — Z79899 Other long term (current) drug therapy: Secondary | ICD-10-CM

## 2015-12-22 ENCOUNTER — Ambulatory Visit (HOSPITAL_COMMUNITY)
Admission: RE | Admit: 2015-12-22 | Discharge: 2015-12-22 | Disposition: A | Discharge status: Home / Self Care | Payer: 59 | Source: Ambulatory Visit | Attending: Hematology & Oncology | Admitting: Hematology & Oncology

## 2015-12-22 ENCOUNTER — Other Ambulatory Visit (HOSPITAL_COMMUNITY): Payer: 59

## 2015-12-22 DIAGNOSIS — M85851 Other specified disorders of bone density and structure, right thigh: Secondary | ICD-10-CM | POA: Diagnosis not present

## 2015-12-22 DIAGNOSIS — Z79899 Other long term (current) drug therapy: Secondary | ICD-10-CM | POA: Insufficient documentation

## 2015-12-22 DIAGNOSIS — M8580 Other specified disorders of bone density and structure, unspecified site: Secondary | ICD-10-CM | POA: Insufficient documentation

## 2015-12-22 DIAGNOSIS — Z780 Asymptomatic menopausal state: Secondary | ICD-10-CM | POA: Diagnosis not present

## 2015-12-22 DIAGNOSIS — Z853 Personal history of malignant neoplasm of breast: Secondary | ICD-10-CM | POA: Diagnosis not present

## 2015-12-27 MED FILL — VALACYCLOVIR HCL 500 MG TAB: 500 | 30 days supply | Qty: 30 | Fill #3

## 2015-12-27 MED FILL — LORazepam 0.5 MG TABS: 0.5 | 30 days supply | Qty: 90 | Fill #1

## 2015-12-27 MED FILL — ATORVASTATIN 40 MG TABLET: 40 | 90 days supply | Qty: 90 | Fill #3

## 2016-01-23 MED FILL — ENALAPRIL MALEATE 10 MG TAB: 10 | 90 days supply | Qty: 90 | Fill #2

## 2016-02-06 ENCOUNTER — Other Ambulatory Visit (HOSPITAL_COMMUNITY): Payer: Self-pay | Admitting: Hematology & Oncology

## 2016-02-06 DIAGNOSIS — Z1231 Encounter for screening mammogram for malignant neoplasm of breast: Secondary | ICD-10-CM

## 2016-02-07 ENCOUNTER — Encounter (HOSPITAL_COMMUNITY): Payer: 59 | Attending: Hematology & Oncology | Admitting: Hematology & Oncology

## 2016-02-07 VITALS — BP 136/90 | HR 77 | Temp 97.7°F | Resp 20 | Wt 133.8 lb

## 2016-02-07 DIAGNOSIS — Z853 Personal history of malignant neoplasm of breast: Secondary | ICD-10-CM

## 2016-02-07 DIAGNOSIS — M8580 Other specified disorders of bone density and structure, unspecified site: Secondary | ICD-10-CM

## 2016-02-07 MED ORDER — LETROZOLE 2.5 MG PO TABS: 2.5000 mg | ORAL_TABLET | Freq: Every day | ORAL | Status: DC

## 2016-02-07 NOTE — Patient Instructions (Addendum)
[name] [name] at [facility] Discharge Instructions  RECOMMENDATIONS MADE BY THE CONSULTANT AND ANY TEST RESULTS WILL BE SENT TO YOUR REFERRING PHYSICIAN.  Bone Density done in April showed some osteopenia - which is thinning --we can keep you on Calcium and Vitamin D and continue to watch you Or we could put you on Prolia. You qualify for Prolia because you have osteopenia and on Femara. You take this once every 6 months. It is an injection in the fatty tissue of your abdomen. You would continue to take your Calcium and Vitamin D. For now, it is ok to take the Calcium and Vitamin D if this is what you prefer.  Continue to walk and do weight bearing exercises - this can help with your bones.   Return to see Dr. [name] in 6 months and we will do a breast exam.   Then you can come yearly.  We can try to get a bone density scheduled for next year. It depends on your insurance.   Mammogram scheduled for soon.   Thank you for choosing [place] at [facility] to provide your oncology and hematology care.  To afford each patient quality time with our provider, please arrive at least 15 minutes before your scheduled appointment time.   Beginning January 23rd 2017 lab work for the [facility] will be done in the  Main lab at [facility] on 1st floor. If you have a lab appointment with the [facility] please come in thru the  Main Entrance and check in at the main information desk  You need to re-schedule your appointment should you arrive 10 or more minutes late.  We strive to give you quality time with our providers, and arriving late affects you and other patients whose appointments are after yours.  Also, if you no show three or more times for appointments you may be dismissed from the clinic at the providers discretion.     Again, thank you for choosing [facility].  Our hope is that these requests will decrease the amount of time  that you wait before being seen by our physicians.       _____________________________________________________________  Should you have questions after your visit to [facility], please contact our office at [phone] between the hours of 8:30 a.m. and 4:30 p.m.  Voicemails left after 4:30 p.m. will not be returned until the following business day.  For prescription refill requests, have your pharmacy contact our office.         Resources For Cancer Patients and their Caregivers ? American Cancer Society: Can assist with transportation, wigs, general needs, runs Look Good Feel Better.        9857785924 ? Cancer Care: Provides financial assistance, online support groups, medication/co-pay assistance.  1-800-813-HOPE 501 304 9834) ? [facility] Assists [place] cancer patients and their families through emotional , educational and financial support.  (562) 732-3398 ? [place] DSS Where to apply for food stamps, Medicaid and utility assistance. 228-716-3632 ? [facility]: Transportation to medical appointments. (423)097-5265 ? Social Security Administration: May apply for disability if have a Stage IV cancer. 818 415 6041 (223) 880-4123 ? [employer], Disability and Transit Services: Assists with nutrition, care and transit needs. [place] Support Programs: @10RELATIVEDAYS @ > Cancer Support Group  2nd Tuesday of the month 1pm-2pm, Journey Room  > Creative Journey  3rd Tuesday of the month 1130am-1pm, Journey Room  >  Look Good Feel Better  1st Wednesday of the month 10am-12 noon, Journey Room (Call [facility] to register [phone]) [phone])

## 2016-02-07 NOTE — Progress Notes (Signed)
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 Ki-67 marker was high at 52% Did not tolerate letrozole, completed 5 years of tamoxifen in May 2012 Opted to retry letrozole for extended therapy which she started in November 2014 R breast biopsy on 6/29/2/016 with fibrocystic changes, no evidence of malignancy  CURRENT THERAPY: surveillance   INTERVAL HISTORY: [name] [name] 66 y.o. female returns for follow up of a previously diagnosed breast cancer.  She schedules her own mammograms. Will schedule it at [facility].   [name] [name] is unaccompanied. I personally reviewed and went over bone density results with the patient.  She is walking some, though not as much as she was. She is trying to get herself going again. Her schedule has changed at work which has led to slight weight loss. Her appetite is good, she just has to watch what she eats. She is taking calcium and Vitamin D supplements.  She continues to wear a lymphedema sleeve on her left arm. She has been having to take the sleeve off if she is working a lot. She requests a new prescription for a lymphedema sleeve.  She is up to date on colonoscopies and pap smear. She would like to move her office visits out to annually.   Appetite is good. Denies SOB or CP. Oncologically ROS questioning is negative.   MEDICAL HISTORY: Past Medical History  Diagnosis Date  . Hypertension   . GERD (gastroesophageal reflux disease)   . Breast cancer ([place])   . Functional dyspepsia MAR 2014    APR 2014: PT DECLINED BRAVO. PPIs-PROTOIX/PRILOSEC-->DEXILANT-->NEXIUM(TOO EXPENSIVE)  . High cholesterol     has Hx Breast cancer, IDC, Left, Stage II, receptor+, Her2 -; HEMORRHOIDS; GERD; Anemia; Dyspepsia; and Chest pain on her problem list.     is  allergic to sulfur.  Current Outpatient Prescriptions on File Prior to Visit  Medication Sig Dispense Refill  . atorvastatin (LIPITOR) 40 MG tablet Take 40 mg by mouth daily.    . Calcium Carb-Cholecalciferol (CALCIUM 1000 + D PO) Take 2 tablets by mouth 2 (two) times daily.      . diphenhydrAMINE (BENADRYL) 25 MG tablet Take 25 mg by mouth every 6 (six) hours as needed for allergies.    Marland Kitchen docusate sodium (COLACE) 100 MG capsule Take 200 mg by mouth at bedtime.    . enalapril (VASOTEC) 10 MG tablet Take 10 mg by mouth daily.      Marland Kitchen esomeprazole (NEXIUM) 40 MG capsule 1 PO 30 MINS PRIOR TO MEALS bid 62 capsule 11  . letrozole (FEMARA) 2.5 MG tablet Take 1 tablet (2.5 mg total) by mouth daily. 90 tablet 4  . LORazepam (ATIVAN) 0.5 MG tablet Take 0.5 mg by mouth at bedtime.    . valACYclovir (VALTREX) 500 MG tablet Take 500 mg by mouth as needed.  12   No current facility-administered medications on file prior to visit.     SURGICAL HISTORY: Past Surgical History  Procedure Laterality Date  . Esophagogastroduodenoscopy  2009    normal stomach,esophagues without mass,reosion  . Colonoscopy  2009    normal colon without evidence of polyps  . Breast lumpectomy  2006    Left  breast  . Breast lumpectomy  1994  Right breast-benign  . Givens capsule study  09/26/2011    Procedure: GIVENS CAPSULE STUDY;  Surgeon: Dorothyann Peng, MD;  [facility];  Service: Endoscopy;  Laterality: N/A;  . Ileocolonoscopy  09/17/2011    SLF: Nl colonoscopy WMO/INTERNA; HEMORRHOIDS/ NO SOURCE FOR IRON DEFICIENCY ANEMA IDENTIFIED  . Esophagogastroduodenoscopy  09/17/2011    SLF: Mild gastritis/NO SOURCE FOR IRON DEFICINECY ANEMIA IDENTIFIED  . Esophagogastroduodenoscopy N/A 12/02/2012    SLF: 1. No source for dyspepsia identified 2. MILD Non-erosive gastritis    SOCIAL HISTORY: Social History   Social History  . Marital Status: Married    Spouse Name: N/A  . Number of Children: N/A  . Years  of Education: N/A   Occupational History  . Not on file.   Social History Main Topics  . Smoking status: Never Smoker   . Smokeless tobacco: Never Used  . Alcohol Use: No  . Drug Use: No  . Sexual Activity: Not on file   Other Topics Concern  . Not on file   Social History Narrative    FAMILY HISTORY: Family History  Problem Relation Age of Onset  . Anesthesia problems Neg Hx   . Hypotension Neg Hx   . Malignant hyperthermia Neg Hx   . Pseudochol deficiency Neg Hx     Review of Systems  Constitutional: Positive for weight loss. Negative for fever, chills, malaise/fatigue and diaphoresis.       Weight loss associated with work schedule change.  HENT: Negative for ear pain, hearing loss and tinnitus.   Eyes: Negative for blurred vision, double vision, pain, discharge and redness.  Respiratory: Negative for cough, hemoptysis, sputum production, shortness of breath and wheezing.   Cardiovascular: Negative for chest pain, palpitations, orthopnea, leg swelling and PND.  Gastrointestinal: Negative for nausea, vomiting, abdominal pain, diarrhea, constipation, blood in stool and melena.  Genitourinary: Negative for dysuria and hematuria.  Musculoskeletal: Negative for myalgias.  Skin: Negative for rash.  Neurological: Negative for dizziness, tingling, weakness and headaches.  Endo/Heme/Allergies: Negative for environmental allergies. Does not bruise/bleed easily.  Psychiatric/Behavioral: Negative for depression, suicidal ideas, hallucinations and substance abuse. The patient is not nervous/anxious.   14 point review of systems was performed and is negative except as detailed under history of present illness and above   PHYSICAL EXAMINATION  ECOG PERFORMANCE STATUS: 0 - Asymptomatic  Filed Vitals:   02/07/16 0856  BP: 136/90  Pulse: 77  Temp: 97.7 F (36.5 C)  Resp: 20    Physical Exam  Constitutional: She is oriented to person, place, and time and well-developed,  well-nourished, and in no distress.  HENT:  Head: Normocephalic and atraumatic.  Nose: Nose normal.  Mouth/Throat: Oropharynx is clear and moist. No oropharyngeal exudate.  Eyes: Conjunctivae and EOM are normal. Pupils are equal, round, and reactive to light. Right eye exhibits no discharge. Left eye exhibits no discharge. No scleral icterus.  Neck: Normal range of motion. Neck supple. No tracheal deviation present. No thyromegaly present.  Cardiovascular: Normal rate, regular rhythm and normal heart sounds.  Exam reveals no gallop and no friction rub.   No murmur heard. Pulmonary/Chest: Effort normal and breath sounds normal. She has no wheezes. She has no rales.  Abdominal: Soft. Bowel sounds are normal. She exhibits no distension and no mass. There is no tenderness. There is no rebound and no guarding.  Musculoskeletal: Normal range of motion. She exhibits no edema.  Lymphadenopathy:    She has no cervical adenopathy.  Neurological:  She is alert and oriented to person, place, and time. She has normal reflexes. No cranial nerve deficit. Gait normal. Coordination normal.  Skin: Skin is warm and dry. No rash noted.  Psychiatric: Mood, memory, affect and judgment normal.  Nursing note and vitals reviewed.   LABORATORY DATA: I have reviewed the data as listed. CBC    Component Value Date/Time   WBC 6.2 09/22/2015 0807   WBC 6.6 06/19/2006 1454   RBC 4.55 09/22/2015 0807   RBC 4.26 06/19/2006 1454   HGB 13.8 09/22/2015 0807   HGB 13.5 06/19/2006 1454   HCT 41.5 09/22/2015 0807   HCT 39.1 06/19/2006 1454   PLT 221 09/22/2015 0807   PLT 193 06/19/2006 1454   MCV 91.2 09/22/2015 0807   MCV 91.8 06/19/2006 1454   MCH 30.3 09/22/2015 0807   MCH 31.7 06/19/2006 1454   MCHC 33.3 09/22/2015 0807   MCHC 34.5 06/19/2006 1454   RDW 13.1 09/22/2015 0807   RDW 12.9 06/19/2006 1454   LYMPHSABS 1.5 09/22/2015 0807   LYMPHSABS 1.0 06/19/2006 1454   MONOABS 0.4 09/22/2015 0807   MONOABS 0.3  06/19/2006 1454   EOSABS 0.0 09/22/2015 0807   EOSABS 0.0 06/19/2006 1454   BASOSABS 0.1 09/22/2015 0807   BASOSABS 0.0 06/19/2006 1454   CMP     Component Value Date/Time   NA 141 09/22/2015 0807   K 4.0 09/22/2015 0807   CL 104 09/22/2015 0807   CO2 30 09/22/2015 0807   GLUCOSE 99 09/22/2015 0807   BUN 13 09/22/2015 0807   CREATININE 0.83 09/22/2015 0807   CALCIUM 9.9 09/22/2015 0807   PROT 7.9 09/22/2015 0807   ALBUMIN 4.7 09/22/2015 0807   AST 29 09/22/2015 0807   ALT 22 09/22/2015 0807   ALKPHOS 69 09/22/2015 0807   BILITOT 1.2 09/22/2015 0807   GFRNONAA >60 09/22/2015 0807   GFRAA >60 09/22/2015 0807   PATHOLOGY:    RADIOGRAPHIC STUDIES: I have personally reviewed the radiological images as listed and agreed with the findings in the report.  Study Result     EXAM: DUAL X-RAY ABSORPTIOMETRY (DXA) FOR BONE MINERAL DENSITY  IMPRESSION: Ordering Physician: Dr. Patrici Ranks,  Your patient [name] [name] completed a BMD test on 12/22/2015 using the [place] (software version: 14.10) manufactured by [employer]. The following summarizes the results of our evaluation. PATIENT BIOGRAPHICAL: Name: [name] [name], [name] [name] Patient ID: [id number] Birth Date: 09/23/1950 Height: 65.0 in. Gender: Female Exam Date: 12/22/2015 Weight: 135.0 lbs. Indications: Caucasian, Follow up Osteopenia, Post Menopausal Fractures: Treatments: Calcium, Letrozole, Vitamin D DENSITOMETRY RESULTS: Site Region Measured Date Measured Age WHO Classification Young Adult T-score BMD %Change vs. Previous Significant Change (*) AP Spine L1-L3 12/22/2015 64.3 Normal -0.9 1.057 g/cm2 -8.1% Yes AP Spine L1-L3 03/16/2010 58.5 Normal -0.2 1.150 g/cm2 -1.5% - AP Spine L1-L3 05/24/2006 54.7 Normal 0.0 1.168 g/cm2 - -  DualFemur Total Right 12/22/2015 64.3 Osteopenia -2.0 0.751 g/cm2 -10.4% Yes DualFemur Total Right 03/16/2010 58.5 Osteopenia -1.3 0.838  g/cm2 2.3% - DualFemur Total Right 05/24/2006 54.7 Osteopenia -1.5 0.819 g/cm2 - - ASSESSMENT: BMD as determined from Femur Total Right is 0.751 g/cm2 with a T-Score of -2.0. This patient is considered osteopenic according to [facility]) criteria. Compared with the prior study on 03/16/2010, the BMD of the lumbar spine and right total hip shows a statistically significant decrease. (L-4 was excluded due to advanced degenerative changes.)  World Health Organization [facility]) criteria for post-menopausal,  Caucasian Women: Normal: T-score at or above -1 SD Osteopenia: T-score between -1 and -2.5 SD Osteoporosis: T-score at or below -2.5 SD  RECOMMENDATIONS: [place] recommends that FDA-approved medial therapies be considered in postmenopausal women and men age 21 or older with a: 1. Hip or vertebral (clinical or morphometric) fracture. 2. T-Score of < -2.5 at the spine or hip. 3. Ten-year fracture probability by FRAX of 3% or greater for hip fracture or 20% or greater for major osteoporotic fracture.  All treatment decisions require clinical judgment and consideration of individual patient factors, including patient preferences, co-morbidities, previous drug use, risk factors not captured in the FRAX model (e.g. falls, vitamin D deficiency, increased bone turnover, interval significant decline in bone density) and possible under-or over-estimation of fracture risk by FRAX.  All patients should ensure an adequate intake of dietary calcium (1200 mg/d) and vitamin D (800 IU daily) unless contraindicated.  FOLLOW-UP: People with diagnosed cases of osteoporosis or osteopenia should be regularly tested for bone mineral density. For patients eligible for Medicare, routine testing is allowed once every 2 years. Testing frequency can be increased for patients who have rapidly progressing disease, or for those who are receiving medical  therapy to restore bone mass.  I have reviewed this report, and agree with the above findings.  [facility] Your patient [name] [name] completed a FRAX assessment on 12/22/2015 using the [place] (analysis version: 14.10) manufactured by [employer]. The following summarizes the results of our evaluation.  PATIENT BIOGRAPHICAL: Name: [name] [name], [name] [name] Patient ID: [id number] Birth Date: 02/09/1951 Height: 65.0 in. Gender: Female Age[age] Weight: 135.0 lbs. Ethnicity: White Exam Date: 12/22/2015  FRAX* RESULTS: (version: 3.5) 10-year Probability of Fracture1 Major Osteoporotic Fracture2 Hip Fracture 9.1% 1.2% Population: [place] (Caucasian) Risk Factors: None  Based on Femur (Right) Neck BMD  1 -The 10-year probability of fracture may be lower than reported if the patient has received treatment. 2 -Major Osteoporotic Fracture: Clinical Spine, Forearm, Hip or Shoulder  *FRAX is a Materials engineer of the [employer] for Metabolic Bone Disease, a [place] (WHO) [facility].  ASSESSMENT: The probability of a major osteoporotic fracture is 9.1% within the next ten years.  The probability of a hip fracture is 1.2% within the next ten years.   Electronically Signed  By: David Martinique M.D.  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 Completed 5 years tamoxifen Started letrozole in November 2014 Minimal left upper extremity lymphedema. Osteopenia High risk medication,   We reviewed her DEXA. We discussed data pertaining to prolia/bisphosphonate therapy in women on AI therapy with osteopenia. She is not interested in pursuing. She takes calcium and vitamin  D. I have encouraged her to increase her physical activity.   I provided her with a prescription for a new lymphedema sleeve.   The patient would like to consider extending follow up to annually at our next visit.   She will return in 6 months for follow up. At this next visit, I will perform a breast exam, we will then move her visits out to annually.   All questions were answered. The patient knows to call the clinic with any problems, questions or concerns. We can certainly see the patient much sooner if necessary.  This note was  electronically signed.  This document serves as a record of services personally performed by Ancil Linsey, MD. It was created on her behalf by Arlyce Harman, a trained medical scribe. The creation of this record is based on the scribe's personal observations and the provider's statements to them. This document has been checked and approved by the attending provider.   I have reviewed the above documentation for accuracy and completeness, and I agree with the above.  Kelby Fam. Penland MD

## 2016-02-08 ENCOUNTER — Encounter (HOSPITAL_COMMUNITY): Payer: Self-pay | Admitting: Hematology & Oncology

## 2016-02-15 MED FILL — ESOMEPRAZOLE MAG DR 40 MG C: 40 | 90 days supply | Qty: 180 | Fill #1

## 2016-02-15 MED FILL — VALACYCLOVIR HCL 500 MG TAB: 500 | 30 days supply | Qty: 30 | Fill #4

## 2016-02-22 MED FILL — LETROZOLE 2.5 MG TABLET: 2.5 | 90 days supply | Qty: 90 | Fill #2

## 2016-02-23 ENCOUNTER — Ambulatory Visit (HOSPITAL_COMMUNITY): Payer: 59

## 2016-03-07 ENCOUNTER — Ambulatory Visit (HOSPITAL_COMMUNITY): Payer: 59

## 2016-03-09 ENCOUNTER — Other Ambulatory Visit (HOSPITAL_COMMUNITY): Payer: Self-pay | Admitting: *Deleted

## 2016-03-09 DIAGNOSIS — Z853 Personal history of malignant neoplasm of breast: Secondary | ICD-10-CM

## 2016-03-12 ENCOUNTER — Other Ambulatory Visit (HOSPITAL_COMMUNITY): Payer: Self-pay | Admitting: Hematology & Oncology

## 2016-03-12 DIAGNOSIS — Z853 Personal history of malignant neoplasm of breast: Secondary | ICD-10-CM

## 2016-03-12 DIAGNOSIS — M79622 Pain in left upper arm: Secondary | ICD-10-CM

## 2016-03-12 DIAGNOSIS — N644 Mastodynia: Secondary | ICD-10-CM

## 2016-03-13 ENCOUNTER — Ambulatory Visit (HOSPITAL_COMMUNITY)
Admission: RE | Admit: 2016-03-13 | Discharge: 2016-03-13 | Disposition: A | Discharge status: Home / Self Care | Payer: 59 | Source: Ambulatory Visit | Attending: Hematology & Oncology | Admitting: Hematology & Oncology

## 2016-03-13 ENCOUNTER — Encounter (HOSPITAL_COMMUNITY): Payer: 59

## 2016-03-13 DIAGNOSIS — N644 Mastodynia: Secondary | ICD-10-CM | POA: Diagnosis not present

## 2016-03-13 DIAGNOSIS — M79602 Pain in left arm: Secondary | ICD-10-CM | POA: Diagnosis not present

## 2016-03-13 DIAGNOSIS — Z853 Personal history of malignant neoplasm of breast: Secondary | ICD-10-CM | POA: Insufficient documentation

## 2016-03-13 DIAGNOSIS — M79622 Pain in left upper arm: Secondary | ICD-10-CM

## 2016-03-20 ENCOUNTER — Encounter (HOSPITAL_COMMUNITY): Payer: 59

## 2016-03-26 DIAGNOSIS — J309 Allergic rhinitis, unspecified: Secondary | ICD-10-CM | POA: Diagnosis not present

## 2016-03-26 DIAGNOSIS — E7800 Pure hypercholesterolemia, unspecified: Secondary | ICD-10-CM | POA: Diagnosis not present

## 2016-03-26 DIAGNOSIS — K2970 Gastritis, unspecified, without bleeding: Secondary | ICD-10-CM | POA: Diagnosis not present

## 2016-03-26 DIAGNOSIS — I10 Essential (primary) hypertension: Secondary | ICD-10-CM | POA: Diagnosis not present

## 2016-03-26 MED FILL — LORazepam 0.5 MG TABS: 0.5 | 30 days supply | Qty: 90 | Fill #0

## 2016-03-26 MED FILL — ATORVASTATIN 40 MG TABLET: 40 | 90 days supply | Qty: 90 | Fill #0

## 2016-03-27 MED FILL — CLOBETASOL 0.05% OINTMENT: 0.05 | 14 days supply | Qty: 45 | Fill #1

## 2016-03-27 MED FILL — VALACYCLOVIR HCL 500 MG TAB: 500 | 30 days supply | Qty: 30 | Fill #5

## 2016-04-03 ENCOUNTER — Other Ambulatory Visit (HOSPITAL_COMMUNITY)
Admission: RE | Admit: 2016-04-03 | Discharge: 2016-04-03 | Disposition: A | Discharge status: Home / Self Care | Payer: 59 | Source: Ambulatory Visit | Attending: Family Medicine | Admitting: Family Medicine

## 2016-04-03 DIAGNOSIS — G629 Polyneuropathy, unspecified: Secondary | ICD-10-CM | POA: Insufficient documentation

## 2016-04-03 LAB — VITAMIN B12: VITAMIN B 12: 322 pg/mL (ref 180–914)

## 2016-04-17 MED FILL — ENALAPRIL MALEATE 10 MG TAB: 10 | 90 days supply | Qty: 90 | Fill #3

## 2016-04-24 ENCOUNTER — Encounter: Payer: Self-pay | Admitting: Gastroenterology

## 2016-04-24 ENCOUNTER — Ambulatory Visit (INDEPENDENT_AMBULATORY_CARE_PROVIDER_SITE_OTHER): Payer: 59 | Admitting: Gastroenterology

## 2016-04-24 VITALS — BP 127/80 | HR 94 | Temp 97.8°F | Ht 66.0 in | Wt 132.0 lb

## 2016-04-24 DIAGNOSIS — R1013 Epigastric pain: Secondary | ICD-10-CM

## 2016-04-24 DIAGNOSIS — K8020 Calculus of gallbladder without cholecystitis without obstruction: Secondary | ICD-10-CM | POA: Diagnosis not present

## 2016-04-24 MED ORDER — IMIPRAMINE HCL 10 MG PO TABS: ORAL_TABLET | ORAL | 3 refills | Status: DC

## 2016-04-24 MED FILL — IMIPRAMINE HCL 10 MG TABLET: 10 | 30 days supply | Qty: 90 | Fill #0

## 2016-04-24 NOTE — Assessment & Plan Note (Signed)
Cannot rule out intermittent symptomatic cholelithiasis. Doubt acute cholecystitis as her recent symptoms have now subsided. Unremarkable exam. Episode of right upper quadrant pain, consider LFTs, lipase 4-6 hours after onset. If symptoms become more frequent, would consider updated abdominal ultrasound and/or surgical consultation.

## 2016-04-24 NOTE — Assessment & Plan Note (Signed)
Persistent dyspepsia. Trial of imipramine. Continue Nexium 40 mg daily. Avoiding trigger foods. Call with progress report in 4 weeks.

## 2016-04-24 NOTE — Progress Notes (Signed)
cc'ed to pcp °

## 2016-04-24 NOTE — Progress Notes (Signed)
Primary Care Physician: [name], MD  Primary Gastroenterologist:  Barney Drain, MD   Chief Complaint  Patient presents with  . Abdominal Pain    HPI: [name] [name] is a 65 y.o. female hereFor follow-up of abdominal pain. She was last seen in January 2016. She has a history of gastritis, dyspepsia. Overall her symptoms are essentially unchanged. She continues to have frequent sensation of being hungry especially in the evenings and in the morning. Described as an empty feeling. Sometimes lasts throughout the day. Nothing seems to make it better. Has tried Tums and Maalox. Eating does not help. She continues to avoid fatty/greasy foods. Avoid spicy, acidic foods. Really not having typical heartburn symptoms. States she really never had that. Recently went down to Nexium once daily and really hasn't noticed any change in her symptoms. Bowel movements are regular. Denies blood in the stool or melena.  She complains of intermittent right upper quadrant abdominal pain. Last Sunday seemed to be her worst episode lasting 8 hours. Radiated into the epigastrium and then into the chest. No nausea or vomiting. Symptoms are not real frequent but she worries about gallbladder. She has known cholelithiasis which was seen on a chest CT previously. She notes that she did eat food she typically would need because her sister had died in a lot of food brought in. Specifically ate steak and gravy prior to this episode.     Current Outpatient Prescriptions  Medication Sig Dispense Refill  . Calcium Carb-Cholecalciferol (CALCIUM 1000 + D PO) Take 2 tablets by mouth 2 (two) times daily.      . diphenhydrAMINE (BENADRYL) 25 MG tablet Take 25 mg by mouth every 6 (six) hours as needed for allergies.    Marland Kitchen docusate sodium (COLACE) 100 MG capsule Take 200 mg by mouth at bedtime.    . enalapril (VASOTEC) 10 MG tablet Take 10 mg by mouth daily.      Marland Kitchen esomeprazole (NEXIUM) 40 MG capsule 1 PO 30 MINS PRIOR  TO MEALS bid 62 capsule 11  . letrozole (FEMARA) 2.5 MG tablet Take 1 tablet (2.5 mg total) by mouth daily. 90 tablet 1  . LORazepam (ATIVAN) 0.5 MG tablet Take 0.5 mg by mouth at bedtime.    . valACYclovir (VALTREX) 500 MG tablet Take 500 mg by mouth as needed.  12   No current facility-administered medications for this visit.     Allergies as of 04/24/2016 - Review Complete 04/24/2016  Allergen Reaction Noted  . Sulfur Itching and Rash    Past Medical History:  Diagnosis Date  . Breast cancer ([place])   . Functional dyspepsia MAR 2014   APR 2014: PT DECLINED BRAVO. PPIs-PROTOIX/PRILOSEC-->DEXILANT-->NEXIUM(TOO EXPENSIVE)  . GERD (gastroesophageal reflux disease)   . High cholesterol   . Hypertension    Past Surgical History:  Procedure Laterality Date  . BREAST LUMPECTOMY  2006   Left  breast  . BREAST LUMPECTOMY  1994   Right breast-benign  . COLONOSCOPY  2009   normal colon without evidence of polyps  . ESOPHAGOGASTRODUODENOSCOPY  2009   normal stomach,esophagues without mass,reosion  . ESOPHAGOGASTRODUODENOSCOPY  09/17/2011   SLF: Mild gastritis/NO SOURCE FOR IRON DEFICINECY ANEMIA IDENTIFIED  . ESOPHAGOGASTRODUODENOSCOPY N/A 12/02/2012   SLF: 1. No source for dyspepsia identified 2. MILD Non-erosive gastritis  . GIVENS CAPSULE STUDY  09/26/2011   Procedure: GIVENS CAPSULE STUDY;  Surgeon: Dorothyann Peng, MD;  [facility];  Service: Endoscopy;  Laterality:  N/A;  . ILEOColonoscopy  09/17/2011   SLF: Nl colonoscopy WMO/INTERNA; HEMORRHOIDS/ NO SOURCE FOR IRON DEFICIENCY ANEMA IDENTIFIED     ROS:  General: Negative for anorexia, weight loss, fever, chills, fatigue, weakness. ENT: Negative for hoarseness, difficulty swallowing , nasal congestion. CV: Negative for chest pain, angina, palpitations, dyspnea on exertion, peripheral edema.  Respiratory: Negative for dyspnea at rest, dyspnea on exertion, cough, sputum, wheezing.  GI: See history of present  illness. GU:  Negative for dysuria, hematuria, urinary incontinence, urinary frequency, nocturnal urination.  Endo: Negative for unusual weight change.    Physical Examination:   BP 127/80   Pulse 94   Temp 97.8 F (36.6 C) (Oral)   Ht 5\' 6"  (1.676 m)   Wt 132 lb (59.9 kg)   BMI 21.31 kg/m   General: Well-nourished, well-developed in no acute distress.  Eyes: No icterus. Mouth: Oropharyngeal mucosa moist and pink , no lesions erythema or exudate. Lungs: Clear to auscultation bilaterally.  Heart: Regular rate and rhythm, no murmurs rubs or gallops.  Abdomen: Bowel sounds are normal, nontender, nondistended, no hepatosplenomegaly or masses, no abdominal bruits or hernia , no rebound or guarding.   Extremities: No lower extremity edema. No clubbing or deformities. Neuro: Alert and oriented x 4   Skin: Warm and dry, no jaundice.   Psych: Alert and cooperative, normal mood and affect.  Labs:  Lab Results  Component Value Date   CREATININE 0.83 09/22/2015   BUN 13 09/22/2015   NA 141 09/22/2015   K 4.0 09/22/2015   CL 104 09/22/2015   CO2 30 09/22/2015   Lab Results  Component Value Date   ALT 22 09/22/2015   AST 29 09/22/2015   ALKPHOS 69 09/22/2015   BILITOT 1.2 09/22/2015   Lab Results  Component Value Date   WBC 6.2 09/22/2015   HGB 13.8 09/22/2015   HCT 41.5 09/22/2015   MCV 91.2 09/22/2015   PLT 221 09/22/2015    Imaging Studies: No results found.

## 2016-04-24 NOTE — Patient Instructions (Signed)
1. Start imipramine for dyspepsia. Take 10 mg at bedtime for 1 week, increase to 20 mg at bedtime for 1 week, then increase to 30 mg at bedtime thereafter. 2. If you have recurrent pain in the right upper abdomen, please go have your lab work done 4-6 hours after onset of severe pain. You need to go to [facility] inside the [place] office building across the street from the hospital. 3. Call in 4 weeks with a progress report.   Indigestion/Dyspepsia Indigestion/dyspepsia is a feeling of pain, discomfort, burning, or fullness in the upper part of your abdomen. It can come and go. It may occur frequently or rarely. Indigestion tends to occur while you are eating or right after you have finished eating. It may be worse at night and while bending over or lying down. HOME CARE INSTRUCTIONS Take these actions to decrease your pain or discomfort and to help avoid complications. Diet  Follow a diet as recommended by your health care provider. This may involve avoiding foods and drinks such as:  Coffee and tea (with or without caffeine).  Drinks that contain alcohol.  Energy drinks and sports drinks.  Carbonated drinks or sodas.  Chocolate and cocoa.  Peppermint and mint flavorings.  Garlic and onions.  Horseradish.  Spicy and acidic foods, including peppers, chili powder, curry powder, vinegar, hot sauces, and barbecue sauce.  Citrus fruit juices and citrus fruits, such as oranges, lemons, and limes.  Tomato-based foods, such as red sauce, chili, salsa, and pizza with red sauce.  Fried and fatty foods, such as donuts, french fries, potato chips, and high-fat dressings.  High-fat meats, such as hot dogs and fatty cuts of red and white meats, such as rib eye steak, sausage, ham, and bacon.  High-fat dairy items, such as whole milk, butter, and cream cheese.  Eat small, frequent meals instead of large meals.  Avoid drinking large amounts of liquid with your  meals.  Avoid eating meals during the 2-3 hours before bedtime.  Avoid lying down right after you eat.  Do not exercise right after you eat. General Instructions  Pay attention to any changes in your symptoms.  Take over-the-counter and prescription medicines only as told by your health care provider. Do not take aspirin, ibuprofen, or other NSAIDs unless your health care provider told you to do so.  Do not use any tobacco products, including cigarettes, chewing tobacco, and e-cigarettes. If you need help quitting, ask your health care provider.  Wear loose-fitting clothing. Do not wear anything tight around your waist that causes pressure on your abdomen.  Raise (elevate) the head of your bed about 6 inches (15 cm).  Try to reduce your stress, such as with yoga or meditation. If you need help reducing stress, ask your health care provider.  If you are overweight, reduce your weight to an amount that is healthy for you. Ask your health care provider for guidance about a safe weight loss goal.  Keep all follow-up visits as told by your health care provider. This is important. SEEK MEDICAL CARE IF:  You have new symptoms.  You have unexplained weight loss.  You have difficulty swallowing, or it hurts to swallow.  Your symptoms do not improve with treatment.  Your symptoms last for more than two days.  You have a fever.  You vomit. SEEK IMMEDIATE MEDICAL CARE IF:  You have pain in your arms, neck, jaw, teeth, or back.  You feel sweaty, dizzy, or light-headed.  You faint.  You have chest pain or shortness of breath.  You cannot stop vomiting, or you vomit blood.  Your stool is bloody or black.  You have severe pain in your abdomen.   This information is not intended to replace advice given to you by your health care provider. Make sure you discuss any questions you have with your health care provider.   Document Released: 09/27/2004 Document Revised: 05/11/2015  Document Reviewed: 12/15/2014 Elsevier Interactive Patient Education [employer].

## 2016-05-09 ENCOUNTER — Encounter (HOSPITAL_COMMUNITY): Payer: Self-pay | Admitting: *Deleted

## 2016-05-09 ENCOUNTER — Emergency Department (HOSPITAL_COMMUNITY): Payer: 59

## 2016-05-09 ENCOUNTER — Other Ambulatory Visit: Payer: Self-pay

## 2016-05-09 ENCOUNTER — Inpatient Hospital Stay (HOSPITAL_COMMUNITY)
Admission: EM | Admit: 2016-05-09 | Discharge: 2016-05-10 | DRG: 419 | Disposition: A | Discharge status: Home / Self Care | Payer: 59 | Attending: Family Medicine | Admitting: Family Medicine

## 2016-05-09 ENCOUNTER — Ambulatory Visit (HOSPITAL_COMMUNITY)
Admission: RE | Admit: 2016-05-09 | Discharge: 2016-05-09 | Disposition: A | Discharge status: Home / Self Care | Payer: 59 | Source: Ambulatory Visit | Attending: Gastroenterology | Admitting: Gastroenterology

## 2016-05-09 ENCOUNTER — Other Ambulatory Visit (HOSPITAL_COMMUNITY)
Admission: RE | Admit: 2016-05-09 | Discharge: 2016-05-09 | Disposition: A | Discharge status: Home / Self Care | Payer: 59 | Source: Ambulatory Visit | Attending: Gastroenterology | Admitting: Gastroenterology

## 2016-05-09 ENCOUNTER — Telehealth: Payer: Self-pay | Admitting: Gastroenterology

## 2016-05-09 DIAGNOSIS — R1011 Right upper quadrant pain: Secondary | ICD-10-CM | POA: Diagnosis present

## 2016-05-09 DIAGNOSIS — G4700 Insomnia, unspecified: Secondary | ICD-10-CM | POA: Diagnosis present

## 2016-05-09 DIAGNOSIS — K8020 Calculus of gallbladder without cholecystitis without obstruction: Secondary | ICD-10-CM | POA: Diagnosis not present

## 2016-05-09 DIAGNOSIS — R109 Unspecified abdominal pain: Secondary | ICD-10-CM

## 2016-05-09 DIAGNOSIS — E7800 Pure hypercholesterolemia, unspecified: Secondary | ICD-10-CM | POA: Diagnosis present

## 2016-05-09 DIAGNOSIS — Z79811 Long term (current) use of aromatase inhibitors: Secondary | ICD-10-CM

## 2016-05-09 DIAGNOSIS — K2970 Gastritis, unspecified, without bleeding: Secondary | ICD-10-CM | POA: Diagnosis present

## 2016-05-09 DIAGNOSIS — K819 Cholecystitis, unspecified: Secondary | ICD-10-CM | POA: Diagnosis not present

## 2016-05-09 DIAGNOSIS — R7989 Other specified abnormal findings of blood chemistry: Secondary | ICD-10-CM | POA: Diagnosis present

## 2016-05-09 DIAGNOSIS — I10 Essential (primary) hypertension: Secondary | ICD-10-CM | POA: Diagnosis present

## 2016-05-09 DIAGNOSIS — K8000 Calculus of gallbladder with acute cholecystitis without obstruction: Principal | ICD-10-CM

## 2016-05-09 DIAGNOSIS — Z9221 Personal history of antineoplastic chemotherapy: Secondary | ICD-10-CM | POA: Diagnosis not present

## 2016-05-09 DIAGNOSIS — F419 Anxiety disorder, unspecified: Secondary | ICD-10-CM | POA: Diagnosis present

## 2016-05-09 DIAGNOSIS — Z853 Personal history of malignant neoplasm of breast: Secondary | ICD-10-CM | POA: Diagnosis not present

## 2016-05-09 DIAGNOSIS — K219 Gastro-esophageal reflux disease without esophagitis: Secondary | ICD-10-CM | POA: Diagnosis present

## 2016-05-09 DIAGNOSIS — K838 Other specified diseases of biliary tract: Secondary | ICD-10-CM | POA: Diagnosis not present

## 2016-05-09 DIAGNOSIS — K8010 Calculus of gallbladder with chronic cholecystitis without obstruction: Secondary | ICD-10-CM | POA: Diagnosis not present

## 2016-05-09 DIAGNOSIS — R1010 Upper abdominal pain, unspecified: Secondary | ICD-10-CM | POA: Diagnosis not present

## 2016-05-09 DIAGNOSIS — R945 Abnormal results of liver function studies: Secondary | ICD-10-CM

## 2016-05-09 LAB — I-STAT CHEM 8, ED
BUN: 18 mg/dL (ref 6–20)
CALCIUM ION: 1.1 mmol/L — AB (ref 1.15–1.40)
Chloride: 100 mmol/L — ABNORMAL LOW (ref 101–111)
Creatinine, Ser: 0.7 mg/dL (ref 0.44–1.00)
Glucose, Bld: 127 mg/dL — ABNORMAL HIGH (ref 65–99)
HEMATOCRIT: 45 % (ref 36.0–46.0)
HEMOGLOBIN: 15.3 g/dL — AB (ref 12.0–15.0)
Potassium: 4 mmol/L (ref 3.5–5.1)
SODIUM: 139 mmol/L (ref 135–145)
TCO2: 27 mmol/L (ref 0–100)

## 2016-05-09 LAB — CBC WITH DIFFERENTIAL/PLATELET
BASOS PCT: 0 %
Basophils Absolute: 0.1 10*3/uL (ref 0.0–0.1)
EOS ABS: 0 10*3/uL (ref 0.0–0.7)
EOS PCT: 0 %
HCT: 42.5 % (ref 36.0–46.0)
Hemoglobin: 14.5 g/dL (ref 12.0–15.0)
LYMPHS ABS: 1.2 10*3/uL (ref 0.7–4.0)
Lymphocytes Relative: 11 %
MCH: 30.9 pg (ref 26.0–34.0)
MCHC: 34.1 g/dL (ref 30.0–36.0)
MCV: 90.4 fL (ref 78.0–100.0)
MONO ABS: 0.7 10*3/uL (ref 0.1–1.0)
MONOS PCT: 6 %
Neutro Abs: 9.6 10*3/uL — ABNORMAL HIGH (ref 1.7–7.7)
Neutrophils Relative %: 83 %
PLATELETS: 191 10*3/uL (ref 150–400)
RBC: 4.7 MIL/uL (ref 3.87–5.11)
RDW: 13.4 % (ref 11.5–15.5)
WBC: 11.6 10*3/uL — ABNORMAL HIGH (ref 4.0–10.5)

## 2016-05-09 LAB — LIPASE, BLOOD: Lipase: 32 U/L (ref 11–51)

## 2016-05-09 LAB — SURGICAL PCR SCREEN
MRSA, PCR: NEGATIVE
Staphylococcus aureus: NEGATIVE

## 2016-05-09 LAB — HEPATIC FUNCTION PANEL
ALT: 67 U/L — AB (ref 14–54)
AST: 149 U/L — AB (ref 15–41)
Albumin: 4.9 g/dL (ref 3.5–5.0)
Alkaline Phosphatase: 63 U/L (ref 38–126)
BILIRUBIN DIRECT: 0.5 mg/dL (ref 0.1–0.5)
BILIRUBIN INDIRECT: 0.9 mg/dL (ref 0.3–0.9)
BILIRUBIN TOTAL: 1.4 mg/dL — AB (ref 0.3–1.2)
Total Protein: 8.1 g/dL (ref 6.5–8.1)

## 2016-05-09 LAB — PROTIME-INR
INR: 1.01
PROTHROMBIN TIME: 13.3 s (ref 11.4–15.2)

## 2016-05-09 LAB — TROPONIN I: Troponin I: <0.03 ng/mL (ref <0.03)

## 2016-05-09 LAB — APTT: APTT: 48 s — AB (ref 24–36)

## 2016-05-09 MED ORDER — CHLORHEXIDINE GLUCONATE CLOTH 2 % EX PADS: 6.0000 | MEDICATED_PAD | Freq: Once | CUTANEOUS | Status: AC

## 2016-05-09 MED ORDER — IMIPRAMINE HCL 10 MG PO TABS
ORAL_TABLET | ORAL | Status: AC
Start: 2016-05-09 — End: 2016-05-09
  Filled 2016-05-09: qty 1

## 2016-05-09 MED ORDER — FENTANYL CITRATE (PF) 100 MCG/2ML IJ SOLN
25.0000 ug | INTRAMUSCULAR | Status: DC | PRN
  Administered 2016-05-10: 25 ug via INTRAVENOUS
  Filled 2016-05-09: qty 2

## 2016-05-09 MED ORDER — POLYETHYLENE GLYCOL 3350 17 G PO PACK: 17.0000 g | PACK | Freq: Every day | ORAL | Status: DC | PRN

## 2016-05-09 MED ORDER — ZOLPIDEM TARTRATE 5 MG PO TABS: 5.0000 mg | ORAL_TABLET | Freq: Every evening | ORAL | Status: DC | PRN

## 2016-05-09 MED ORDER — LORAZEPAM 0.5 MG PO TABS
0.5000 mg | ORAL_TABLET | Freq: Every day | ORAL | Status: DC
  Administered 2016-05-09: 0.5 mg via ORAL
  Filled 2016-05-09: qty 1

## 2016-05-09 MED ORDER — CHLORHEXIDINE GLUCONATE CLOTH 2 % EX PADS
6.0000 | MEDICATED_PAD | Freq: Once | CUTANEOUS | Status: AC
  Administered 2016-05-09 – 2016-05-10 (×2): 6 via TOPICAL

## 2016-05-09 MED ORDER — ONDANSETRON HCL 4 MG/2ML IJ SOLN
4.0000 mg | Freq: Once | INTRAMUSCULAR | Status: AC
  Administered 2016-05-09: 4 mg via INTRAVENOUS
  Filled 2016-05-09: qty 2

## 2016-05-09 MED ORDER — SODIUM CHLORIDE 0.9 % IV SOLN
INTRAVENOUS | Status: AC
  Administered 2016-05-09: 18:00:00 via INTRAVENOUS

## 2016-05-09 MED ORDER — FENTANYL CITRATE (PF) 100 MCG/2ML IJ SOLN
50.0000 ug | Freq: Once | INTRAMUSCULAR | Status: AC
  Administered 2016-05-09: 50 ug via INTRAVENOUS
  Filled 2016-05-09: qty 2

## 2016-05-09 MED ORDER — BISACODYL 5 MG PO TBEC: 5.0000 mg | DELAYED_RELEASE_TABLET | Freq: Every day | ORAL | Status: DC | PRN

## 2016-05-09 MED ORDER — DIPHENHYDRAMINE HCL 25 MG PO CAPS
25.0000 mg | ORAL_CAPSULE | Freq: Four times a day (QID) | ORAL | Status: DC | PRN
Start: 2016-05-09 — End: 2016-05-10
  Filled 2016-05-09: qty 1

## 2016-05-09 MED ORDER — ONDANSETRON HCL 4 MG/2ML IJ SOLN
4.0000 mg | Freq: Four times a day (QID) | INTRAMUSCULAR | Status: DC | PRN
  Administered 2016-05-10: 4 mg via INTRAVENOUS
  Filled 2016-05-09: qty 2

## 2016-05-09 MED ORDER — CHOLECALCIFEROL 10 MCG (400 UNIT) PO TABS: 800.0000 [IU] | ORAL_TABLET | Freq: Two times a day (BID) | ORAL | Status: DC

## 2016-05-09 MED ORDER — FAMOTIDINE IN NACL 20-0.9 MG/50ML-% IV SOLN
INTRAVENOUS | Status: AC
Start: 2016-05-09 — End: 2016-05-10
  Filled 2016-05-09: qty 50

## 2016-05-09 MED ORDER — LETROZOLE 2.5 MG PO TABS
2.5000 mg | ORAL_TABLET | Freq: Every day | ORAL | Status: DC
  Filled 2016-05-09 (×2): qty 1

## 2016-05-09 MED ORDER — CHLORHEXIDINE GLUCONATE CLOTH 2 % EX PADS
6.0000 | MEDICATED_PAD | Freq: Once | CUTANEOUS | Status: AC
  Administered 2016-05-09: 6 via TOPICAL

## 2016-05-09 MED ORDER — ONDANSETRON HCL 4 MG PO TABS: 4.0000 mg | ORAL_TABLET | Freq: Four times a day (QID) | ORAL | Status: DC | PRN

## 2016-05-09 MED ORDER — FAMOTIDINE IN NACL 20-0.9 MG/50ML-% IV SOLN
20.0000 mg | Freq: Two times a day (BID) | INTRAVENOUS | Status: DC
  Administered 2016-05-09 – 2016-05-10 (×2): 20 mg via INTRAVENOUS
  Filled 2016-05-09: qty 50

## 2016-05-09 MED ORDER — CALCIUM CARB-CHOLECALCIFEROL 1000-800 MG-UNIT PO TABS: 1.0000 | ORAL_TABLET | Freq: Two times a day (BID) | ORAL | Status: DC

## 2016-05-09 MED ORDER — IMIPRAMINE HCL 10 MG PO TABS
ORAL_TABLET | ORAL | Status: AC
  Filled 2016-05-09: qty 1

## 2016-05-09 MED ORDER — IMIPRAMINE HCL 10 MG PO TABS
20.0000 mg | ORAL_TABLET | Freq: Every day | ORAL | Status: DC
Start: 2016-05-09 — End: 2016-05-10
  Administered 2016-05-09: 20 mg via ORAL
  Filled 2016-05-09 (×2): qty 2

## 2016-05-09 MED ORDER — CALCIUM CARBONATE 1250 (500 CA) MG PO TABS: 2.0000 | ORAL_TABLET | Freq: Two times a day (BID) | ORAL | Status: DC

## 2016-05-09 MED ORDER — HEPARIN SODIUM (PORCINE) 5000 UNIT/ML IJ SOLN: 5000.0000 [IU] | Freq: Once | INTRAMUSCULAR | Status: DC

## 2016-05-09 MED ORDER — PIPERACILLIN-TAZOBACTAM 3.375 G IVPB
3.3750 g | Freq: Three times a day (TID) | INTRAVENOUS | Status: DC
  Administered 2016-05-09 – 2016-05-10 (×2): 3.375 g via INTRAVENOUS
  Filled 2016-05-09 (×2): qty 50

## 2016-05-09 MED ORDER — ACETAMINOPHEN 650 MG RE SUPP: 650.0000 mg | Freq: Four times a day (QID) | RECTAL | Status: DC | PRN

## 2016-05-09 MED ORDER — PIPERACILLIN-TAZOBACTAM 3.375 G IVPB 30 MIN
3.3750 g | Freq: Once | INTRAVENOUS | Status: AC
  Administered 2016-05-09: 3.375 g via INTRAVENOUS
  Filled 2016-05-09: qty 50

## 2016-05-09 MED ORDER — ACETAMINOPHEN 325 MG PO TABS: 650.0000 mg | ORAL_TABLET | Freq: Four times a day (QID) | ORAL | Status: DC | PRN

## 2016-05-09 MED ORDER — HYDRALAZINE HCL 20 MG/ML IJ SOLN: 5.0000 mg | INTRAMUSCULAR | Status: DC | PRN

## 2016-05-09 MED ORDER — PANTOPRAZOLE SODIUM 40 MG PO TBEC: 40.0000 mg | DELAYED_RELEASE_TABLET | Freq: Every day | ORAL | Status: DC

## 2016-05-09 NOTE — H&P (Signed)
History and Physical    [name] [name] [id number] DOB: Jan 17, 1951 DOA: 05/09/2016  PCP: [name], MD   Patient coming from: Home   Chief Complaint: RUQ abdominal pain, nausea, heartburn  HPI: [name] [name] is a 65 y.o. female with medical history significant for cancer of the left breast status post lumpectomy and chemotherapy, GERD, gastritis, hypertension, and cholelithiasis who presents to the emergency department for evaluation of acute, severe, persistent pain in the right upper quadrant of her abdomen. Patient is had occasional biliary colic for more than one year and has been followed by gastroenterology. She had some more severe and frequent episodes for the past couple weeks, and then today developed severe pain at approximately 6 AM and this has persisted. She was evaluated with ultrasound and LFTs through her gastroenterology clinic and there was sonographic evidence for acute cholecystitis. Mild intrahepatic and common bile duct dilation is noted on the ultrasound, but no intraluminal stone or pancreatic head mass is identified. AST was elevated to 149, ALT to 67, and total bilirubin mildly elevated to 1.4. She was directed to the emergency department for further evaluation of this. Patient had experienced chills couple days ago, but denies fevers. There has been no chest pain, palpitations, dyspnea, or cough.  ED Course: Upon arrival to the ED, patient is found to be afebrile, saturating well on room air, and with vital signs stable. EKG demonstrates a sinus rhythm and chest x-ray is negative for acute cardiopulmonary disease. Chemistry panel features AST of 149, ALT 67, and total bilirubin of 1.4. CBC is notable for a leukocytosis to 11,600. Surgery was consulted by the ED physician and advised a medical admission, keep the patient nothing by mouth after midnight, and reported tentative plans for cholecystectomy tomorrow. Patient was treated with Zosyn in the emergency department  and symptomatic care was provided with fentanyl and Zofran. She has remained hemodynamically stable in the ED and in no respiratory distress. She will be admitted to the medical-surgical unit for ongoing evaluation and management of acute cholecystitis.  Review of Systems:  All other systems reviewed and apart from HPI, are negative.  Past Medical History:  Diagnosis Date  . Breast cancer ([place])   . Functional dyspepsia MAR 2014   APR 2014: PT DECLINED BRAVO. PPIs-PROTOIX/PRILOSEC-->DEXILANT-->NEXIUM(TOO EXPENSIVE)  . GERD (gastroesophageal reflux disease)   . High cholesterol   . Hypertension     Past Surgical History:  Procedure Laterality Date  . BREAST LUMPECTOMY  2006   Left  breast  . BREAST LUMPECTOMY  1994   Right breast-benign  . COLONOSCOPY  2009   normal colon without evidence of polyps  . ESOPHAGOGASTRODUODENOSCOPY  2009   normal stomach,esophagues without mass,reosion  . ESOPHAGOGASTRODUODENOSCOPY  09/17/2011   SLF: Mild gastritis/NO SOURCE FOR IRON DEFICINECY ANEMIA IDENTIFIED  . ESOPHAGOGASTRODUODENOSCOPY N/A 12/02/2012   SLF: 1. No source for dyspepsia identified 2. MILD Non-erosive gastritis  . GIVENS CAPSULE STUDY  09/26/2011   Procedure: GIVENS CAPSULE STUDY;  Surgeon: Dorothyann Peng, MD;  Location: [facility];  Service: Endoscopy;  Laterality: N/A;  . ILEOColonoscopy  09/17/2011   SLF: Nl colonoscopy WMO/INTERNA; HEMORRHOIDS/ NO SOURCE FOR IRON DEFICIENCY ANEMA IDENTIFIED     reports that she has never smoked. She has never used smokeless tobacco. She reports that she does not drink alcohol or use drugs.  Allergies  Allergen Reactions  . Sulfur Itching and Rash    Family History  Problem Relation Age of Onset  .  Anesthesia problems Neg Hx   . Hypotension Neg Hx   . Malignant hyperthermia Neg Hx   . Pseudochol deficiency Neg Hx      Prior to Admission medications   Medication Sig Start Date End Date Taking? Authorizing Provider  Calcium  Carb-Cholecalciferol (CALCIUM 1000 + D PO) Take 2 tablets by mouth 2 (two) times daily.      Historical Provider, MD  diphenhydrAMINE (BENADRYL) 25 MG tablet Take 25 mg by mouth every 6 (six) hours as needed for allergies.    Historical Provider, MD  docusate sodium (COLACE) 100 MG capsule Take 200 mg by mouth at bedtime.    Historical Provider, MD  enalapril (VASOTEC) 10 MG tablet Take 10 mg by mouth daily.      Historical Provider, MD  esomeprazole (NEXIUM) 40 MG capsule 1 PO 30 MINS PRIOR TO MEALS bid 09/08/14   Danie Binder, MD  imipramine (TOFRANIL) 10 MG tablet Take 10 mg at bedtime for 1 week, increase to 20 mg at bedtime for 1 week, then increase to 30 mg at bedtime thereafter. 04/24/16   Mahala Menghini, PA-C  letrozole [facility]) 2.5 MG tablet Take 1 tablet (2.5 mg total) by mouth daily. 02/07/16   Patrici Ranks, MD  LORazepam (ATIVAN) 0.5 MG tablet Take 0.5 mg by mouth at bedtime.    Historical Provider, MD  valACYclovir (VALTREX) 500 MG tablet Take 500 mg by mouth as needed. 07/13/15   Historical Provider, MD    Physical Exam: Vitals:   05/09/16 1520 05/09/16 1521  BP:  132/58  Pulse:  66  Resp:  18  Temp:  97.6 F (36.4 C)  TempSrc:  Oral  SpO2:  100%  Weight: 60.8 kg (134 lb)   Height: 5\' 6"  (1.676 m)       Constitutional: NAD, calm, comfortable Eyes: PERTLA, lids and conjunctivae normal ENMT: Mucous membranes are moist. Posterior pharynx clear of any exudate or lesions.   Neck: normal, supple, no masses, no thyromegaly Respiratory: clear to auscultation bilaterally, no wheezing, no crackles. Normal respiratory effort.    Cardiovascular: S1 & S2 heard, regular rate and rhythm, no significant murmur. No extremity edema. 2+ pedal pulses. No significant JVD. Abdomen: No distension, tender in epigastrium and RUQ without rebound or guarding, no masses palpated. Bowel sounds normal.  Musculoskeletal: no clubbing / cyanosis. No joint deformity upper and lower extremities. Normal  muscle tone.  Skin: no significant rashes, lesions, ulcers. Warm, dry, well-perfused. Neurologic: CN 2-12 grossly intact. Sensation intact, DTR normal. Strength 5/5 in all 4 limbs.  Psychiatric: Normal judgment and insight. Alert and oriented x 3. Normal mood and affect.     Labs on Admission: I have personally reviewed following labs and imaging studies  CBC:  Recent Labs Lab 05/09/16 1133 05/09/16 1605  WBC 11.6*  --   NEUTROABS 9.6*  --   HGB 14.5 15.3*  HCT 42.5 45.0  MCV 90.4  --   PLT 191  --    Basic Metabolic Panel:  Recent Labs Lab 05/09/16 1605  NA 139  K 4.0  CL 100*  GLUCOSE 127*  BUN 18  CREATININE 0.70   GFR: Estimated Creatinine Clearance: 66.5 mL/min (by C-G formula based on SCr of 0.8 mg/dL). Liver Function Tests:  Recent Labs Lab 05/09/16 1133  AST 149*  ALT 67*  ALKPHOS 63  BILITOT 1.4*  PROT 8.1  ALBUMIN 4.9    Recent Labs Lab 05/09/16 1133  LIPASE 32   No  results for input(s): AMMONIA in the last 168 hours. Coagulation Profile: No results for input(s): INR, PROTIME in the last 168 hours. Cardiac Enzymes: No results for input(s): CKTOTAL, CKMB, CKMBINDEX, TROPONINI in the last 168 hours. BNP (last 3 results) No results for input(s): PROBNP in the last 8760 hours. HbA1C: No results for input(s): HGBA1C in the last 72 hours. CBG: No results for input(s): GLUCAP in the last 168 hours. Lipid Profile: No results for input(s): CHOL, HDL, LDLCALC, TRIG, CHOLHDL, LDLDIRECT in the last 72 hours. Thyroid Function Tests: No results for input(s): TSH, T4TOTAL, FREET4, T3FREE, THYROIDAB in the last 72 hours. Anemia Panel: No results for input(s): VITAMINB12, FOLATE, FERRITIN, TIBC, IRON, RETICCTPCT in the last 72 hours. Urine analysis: No results found for: COLORURINE, APPEARANCEUR, LABSPEC, PHURINE, GLUCOSEU, HGBUR, BILIRUBINUR, KETONESUR, PROTEINUR, UROBILINOGEN, NITRITE, LEUKOCYTESUR Sepsis  Labs: @LABRCNTIP (procalcitonin:4,lacticidven:4) )No results found for this or any previous visit (from the past 240 hour(s)).   Radiological Exams on Admission: Dg Chest 2 View  Result Date: 05/09/2016 CLINICAL DATA:  65 year old female with progressive mid upper abdominal pain. Scheduled for gallbladder surgery tomorrow. Initial encounter. EXAM: CHEST  2 VIEW COMPARISON:  CT Abdomen and Pelvis 12/01/2014 and earlier. FINDINGS: Stable postoperative changes to the left chest wall and axilla. Lung volumes remain normal. Normal cardiac size and mediastinal contours. Visualized tracheal air column is within normal limits. Apical scarring in the left upper lung which may be sequelae of radiation. Otherwise both lungs remain clear. No pneumothorax or pleural effusion. Stable thoracic scoliosis. Osteopenia. No acute osseous abnormality identified. Negative visible bowel gas pattern. No pneumoperitoneum. IMPRESSION: Stable post treatment changes to the left chest and left lung. No acute cardiopulmonary abnormality. Electronically Signed   By: Genevie Ann M.D.   On: 05/09/2016 16:54   US Abdomen Complete  Result Date: 05/09/2016 CLINICAL DATA:  Severe epigastric pain 6 since 06/30 today. History of hypertension, breast malignancy. EXAM: ABDOMEN ULTRASOUND COMPLETE COMPARISON:  Abdominal and pelvic CT scan dated August 11, 2008 FINDINGS: Gallbladder: The gallbladder is mildly distended. There is wall thickening to 7 mm. There is a small amount of pericholecystic fluid. There is a positive sonographic Murphy's sign. There are multiple mobile gallstones measuring up to 1.6 cm. Common bile duct: Diameter: 7 mm Liver: The liver exhibits normal echotexture. There is mild intrahepatic ductal dilation. There is no focal mass. IVC: No abnormality visualized. Pancreas: The pancreatic duct is visible measuring up to 2 mm in diameter. The pancreatic parenchyma appears normal where visualized. Spleen: Size and appearance within  normal limits. Right Kidney: Length: 9.6 cm. Echogenicity within normal limits. No mass or hydronephrosis visualized. Left Kidney: Length: 9.7 cm. Echogenicity within normal limits. No mass or hydronephrosis visualized. Abdominal aorta: No aneurysm visualized. Other findings: None. IMPRESSION: 1. Gallstones with sonographic evidence of acute cholecystitis. 2. Mild intrahepatic and common bile ductal dilation. No intraluminal stones or pancreatic head mass are observed. 3. No acute abnormality observed elsewhere within the abdomen. 4. These results will be called to the ordering clinician or representative by the Radiologist Assistant, and communication documented in the PACS or zVision Dashboard. Electronically Signed   By: David  Martinique M.D.   On: 05/09/2016 14:52    EKG: Independently reviewed. Sinus rhythm  Assessment/Plan  1. Acute cholecystitis  - Pt reports occasional biliary colic for years, more frequent and severe episodes in last 2 wks, and now severe and persisting pain since early in the am on day of presentation - US findings consistent with acute  cholecystitis; mild intrahepatic and CBD dilation noted without intraluminal stone of pancreatic head mass - AST 149, ALT 67, total bili 1.4  - Afebrile, non-toxic in appearance; WBC 11,600  - Empiric abx with Zosyn  - Symptomatic management with prn Zosyn and Zofran  - Gentle IVF hydration, keep NPO after midnight  - Surgery is consulting and much appreciated; tentative plan for surgery 05/10/16   2. Hx of left breast cancer  - Underwent lumpectomy in 2006, followed by 6 cycles TAC, 5 yrs tamoxifen complete in 2012  - Currently taking letrozole and followed by Dr. [name] of oncology  - Letrozole can likely be resumed along with diet following surgery   3. GERD  - Mild gastritis on EGD in 2013, but normal esophagus  - Managed with Prilosec at home - Had been worse lately and she was started on imipramine by GI given the gastritis hx  -  Continue PPI with Protonix while in hospital - While NPO, IV Pepcid will be used   4. Anxiety, insomnia  - Stable  - Managed with low-dose Ativan qHS, will continue    5. Hypertension  - At goal currently  - Managed with enalapril at home; holding ACE/ARB prior to non-cardiac surgery  - Monitor and treat with prn hydralazine IVP for now    DVT prophylaxis: SCD's Code Status: Full  Family Communication: Husband updated at bedside Disposition Plan: Admit to med-surg Consults called: Surgery  Admission status: Inpatient    Vianne Bulls, MD [facility] Pager [phone]  If 7PM-7AM, please contact night-coverage [email or web address] Password [facility]  05/09/2016, 5:33 PM

## 2016-05-09 NOTE — Progress Notes (Signed)
Discussed findings with patient. Acute onset biliary type pain at 6 am this morning and unrelenting. She has had biliary colic for couple of weeks. Based on u/s findings she has acute cholecystitis. Mild intraheptic ductal dilation.  Her labs show mild leukocytosis, mild increase in AST/ALT, Total bili.  Recommended she go to ER for evaluation and likely admission by surgery. Spoke with triage nurse. Patient agreed.

## 2016-05-09 NOTE — ED Triage Notes (Signed)
abd pain off and on over a year.  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

## 2016-05-09 NOTE — Telephone Encounter (Signed)
Abdominal US scheduled for tomorrow 05/10/16 at 8:15am. To be NPO after Midnight. Called pt to inform. She is going to see if [place] can be done today d/t pain and not feeling well.

## 2016-05-09 NOTE — ED Provider Notes (Signed)
[facility] DEPT Provider Note   CSN: [id number] Arrival date & time: 05/09/16  1514     History   Chief Complaint Chief Complaint  Patient presents with  . Abdominal Pain    HPI [name] [name] is a 65 y.o. female.  The history is provided by the patient.  Abdominal Pain   This is a new problem. The current episode started more than 1 week ago. The problem occurs every several days. The problem has been gradually worsening. The pain is located in the RUQ and epigastric region. The pain is severe. Pertinent negatives include fever, vomiting and dysuria. The symptoms are aggravated by palpation. Relieved by: rest.  Patient reports over 1-2 weeks ago she began developing upper abdominal pain.  It would subside on its own Over the past few days her pain has worsened and is more frequent This most recent episode started this morning and is unrelieved She also has some chest discomfort and back discomfort No fever/vomiting/diarrhea She reports chills Seen by GI with outpatient ultrasound and sent for evaluation   Past Medical History:  Diagnosis Date  . Breast cancer ([place])   . Functional dyspepsia MAR 2014   APR 2014: PT DECLINED BRAVO. PPIs-PROTOIX/PRILOSEC-->DEXILANT-->NEXIUM(TOO EXPENSIVE)  . GERD (gastroesophageal reflux disease)   . High cholesterol   . Hypertension     Patient Active Problem List   Diagnosis Date Noted  . Cholelithiasis 04/24/2016  . Dyspepsia 11/12/2012  . Chest pain 11/12/2012  . Anemia 08/20/2012  . GERD 01/27/2009  . HEMORRHOIDS 01/26/2009  . 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 BREAST LUMPECTOMY  2006   Left  breast  . BREAST LUMPECTOMY  1994   Right breast-benign  . COLONOSCOPY  2009   normal colon without evidence of polyps  . ESOPHAGOGASTRODUODENOSCOPY  2009   normal stomach,esophagues without mass,reosion  . ESOPHAGOGASTRODUODENOSCOPY   09/17/2011   SLF: Mild gastritis/NO SOURCE FOR IRON DEFICINECY ANEMIA IDENTIFIED  . ESOPHAGOGASTRODUODENOSCOPY N/A 12/02/2012   SLF: 1. No source for dyspepsia identified 2. MILD Non-erosive gastritis  . GIVENS CAPSULE STUDY  09/26/2011   Procedure: GIVENS CAPSULE STUDY;  Surgeon: Dorothyann Peng, MD;  Location: [facility];  Service: Endoscopy;  Laterality: N/A;  . ILEOColonoscopy  09/17/2011   SLF: Nl colonoscopy WMO/INTERNA; HEMORRHOIDS/ NO SOURCE FOR IRON DEFICIENCY ANEMA IDENTIFIED    OB History    No data available       Home Medications    Prior to Admission medications   Medication Sig Start Date End Date Taking? Authorizing Provider  Calcium Carb-Cholecalciferol (CALCIUM 1000 + D PO) Take 2 tablets by mouth 2 (two) times daily.      Historical Provider, MD  diphenhydrAMINE (BENADRYL) 25 MG tablet Take 25 mg by mouth every 6 (six) hours as needed for allergies.    Historical Provider, MD  docusate sodium (COLACE) 100 MG capsule Take 200 mg by mouth at bedtime.    Historical Provider, MD  enalapril (VASOTEC) 10 MG tablet Take 10 mg by mouth daily.      Historical Provider, MD  esomeprazole (NEXIUM) 40 MG capsule 1 PO 30 MINS PRIOR TO MEALS bid 09/08/14   Danie Binder, MD  imipramine (TOFRANIL) 10 MG tablet Take 10 mg at bedtime for 1 week, increase to 20 mg at bedtime for 1 week, then increase to 30 mg at bedtime thereafter. 04/24/16   Laureen Ochs  Lewis, PA-C  letrozole (FEMARA) 2.5 MG tablet Take 1 tablet (2.5 mg total) by mouth daily. 02/07/16   Allene Pyo, MD  LORazepam (ATIVAN) 0.5 MG tablet Take 0.5 mg by mouth at bedtime.    Historical Provider, MD  valACYclovir (VALTREX) 500 MG tablet Take 500 mg by mouth as needed. 07/13/15   Historical Provider, MD    Family History Family History  Problem Relation Age of Onset  . Anesthesia problems Neg Hx   . Hypotension Neg Hx   . Malignant hyperthermia Neg Hx   . Pseudochol deficiency Neg Hx     Social History Social History   Substance Use Topics  . Smoking status: Never Smoker  . Smokeless tobacco: Never Used  . Alcohol use No     Allergies   Sulfur   Review of Systems Review of Systems  Constitutional: Positive for chills. Negative for fever.  Respiratory: Negative for cough.   Gastrointestinal: Positive for abdominal pain. Negative for vomiting.  Genitourinary: Negative for dysuria.  All other systems reviewed and are negative.    Physical Exam Updated Vital Signs BP 132/58 (BP Location: Right Arm)   Pulse 66   Temp 97.6 F (36.4 C) (Oral)   Resp 18   Ht 5\' 6"  (1.676 m)   Wt 60.8 kg   SpO2 100%   BMI 21.63 kg/m   Physical Exam  CONSTITUTIONAL: Well developed/well nourished HEAD: Normocephalic/atraumatic EYES: EOMI/PERRL ENMT: Mucous membranes moist NECK: supple no meningeal signs SPINE/BACK:entire spine nontender CV: S1/S2 noted, no murmurs/rubs/gallops noted LUNGS: Lungs are clear to auscultation bilaterally, no apparent distress ABDOMEN: soft, moderate epigastric tenderness, no rebound or guarding, bowel sounds noted throughout abdomen GU:no cva tenderness NEURO: Pt is awake/alert/appropriate, moves all extremitiesx4.  No facial droop.   EXTREMITIES: pulses normal/equal, full ROM SKIN: warm, color normal PSYCH: no abnormalities of mood noted, alert and oriented to situation  ED Treatments / Results  Labs (all labs ordered are listed, but only abnormal results are displayed) Labs Reviewed  APTT - Abnormal; Notable for the following:       Result Value   aPTT 48 (*)    All other components within normal limits  I-STAT CHEM 8, ED - Abnormal; Notable for the following:    Chloride 100 (*)    Glucose, Bld 127 (*)    Calcium, Ion 1.10 (*)    Hemoglobin 15.3 (*)    All other components within normal limits  SURGICAL PCR SCREEN  PROTIME-INR  TROPONIN I  CBC WITH DIFFERENTIAL/PLATELET  COMPREHENSIVE METABOLIC PANEL    EKG ED ECG REPORT   Date: 05/09/2016 1600   Rate: 61  Rhythm: normal sinus rhythm  QRS Axis: normal  Intervals: normal  ST/T Wave abnormalities: normal  Conduction Disutrbances:none  I have personally reviewed the EKG tracing and agree with the computerized printout as noted.  Radiology 07/09/2016 Abdomen Complete  Result Date: 05/09/2016 CLINICAL DATA:  Severe epigastric pain 6 since 06/30 today. History of hypertension, breast malignancy. EXAM: ABDOMEN ULTRASOUND COMPLETE COMPARISON:  Abdominal and pelvic CT scan dated August 11, 2008 FINDINGS: Gallbladder: The gallbladder is mildly distended. There is wall thickening to 7 mm. There is a small amount of pericholecystic fluid. There is a positive sonographic Murphy's sign. There are multiple mobile gallstones measuring up to 1.6 cm. Common bile duct: Diameter: 7 mm Liver: The liver exhibits normal echotexture. There is mild intrahepatic ductal dilation. There is no focal mass. IVC: No abnormality visualized. Pancreas: The pancreatic duct is  visible measuring up to 2 mm in diameter. The pancreatic parenchyma appears normal where visualized. Spleen: Size and appearance within normal limits. Right Kidney: Length: 9.6 cm. Echogenicity within normal limits. No mass or hydronephrosis visualized. Left Kidney: Length: 9.7 cm. Echogenicity within normal limits. No mass or hydronephrosis visualized. Abdominal aorta: No aneurysm visualized. Other findings: None. IMPRESSION: 1. Gallstones with sonographic evidence of acute cholecystitis. 2. Mild intrahepatic and common bile ductal dilation. No intraluminal stones or pancreatic head mass are observed. 3. No acute abnormality observed elsewhere within the abdomen. 4. These results will be called to the ordering clinician or representative by the Radiologist Assistant, and communication documented in the PACS or zVision Dashboard. Electronically Signed   By: David  Martinique M.D.   On: 05/09/2016 14:52    Procedures Procedures (including critical care  time)  Medications Ordered in ED   Initial Impression / Assessment and Plan / ED Course  I have reviewed the triage vital signs and the nursing notes.  Pertinent labs & imaging results that were available during my care of the patient were reviewed by me and considered in my medical decision making (see chart for details).  Clinical Course    Outpatient labs/imaging reviewed Pt with acute cholecystitis D/w dr Arnoldo Morale He recommends IV zosyn, admit to hospitalist and he will see tomorrow morning KEEP  NPO AFTER MIDNIGHT HE MIGHT PLAN SURGERY TOMORROW AT [facility]   Final Clinical Impressions(s) / ED Diagnoses   Final diagnoses:  Cholecystitis    New Prescriptions New Prescriptions   No medications on file     Ripley Fraise, MD 05/09/16 2129

## 2016-05-09 NOTE — ED Notes (Signed)
Report given to Val, RN for room 320.

## 2016-05-09 NOTE — Telephone Encounter (Signed)
Pt is aware and I am faxing the lab orders to her. Ginger or Martinia to schedule the [place].

## 2016-05-09 NOTE — Telephone Encounter (Signed)
Please have your have the LFTs and LIPASE as previously ordered. Add CBC as well.  Needs abdominal u/s for ruq pain.

## 2016-05-09 NOTE — Progress Notes (Signed)
She has had a definite bump in her AST/ALT, total bilirubin, WBC.  Abdominal u/s on for first thing in the morning.  If her pain worsens in the meantime, she develops vomiting, develops fever, she should go to the ER.

## 2016-05-09 NOTE — Progress Notes (Signed)
Spoke with patient. She has had intermittent upper abdominal pain Friday night, Saturday. Lasted less than an hour. Today symptoms started at 6:30am and haven't let up. Pain in upper abd and into chest and mid-back. No vomiting. She is going to have her u/s today at 2:30. She will have u/s tech call with results before she leaves the hospital. Discussed results and warning symptoms.

## 2016-05-09 NOTE — Progress Notes (Signed)
Pharmacy Antibiotic Note  ARETINA FINGERHUT is a 65 y.o. female admitted on 05/09/2016 with Acute cholecystitis .  Pharmacy has been consulted for Zosyn dosing.  Plan: Zosyn 3.375g IV q8h (4 hour infusion).  Monitor labs, micro and vitals.   Height: 5\' 6"  (167.6 cm) Weight: 134 lb (60.8 kg) IBW/kg (Calculated) : 59.3  Temp (24hrs), Avg:97.6 F (36.4 C), Min:97.6 F (36.4 C), Max:97.6 F (36.4 C)   Recent Labs Lab 05/09/16 1133 05/09/16 1605  WBC 11.6*  --   CREATININE  --  0.70    Estimated Creatinine Clearance: 66.5 mL/min (by C-G formula based on SCr of 0.8 mg/dL).    Allergies  Allergen Reactions  . Sulfur Itching and Rash    Antimicrobials this admission: Zosyn 9/6 >>   Dose adjustments this admission: n/a  Microbiology results:   Thank you for allowing pharmacy to be a part of this patient's care.  Pricilla Larsson 05/09/2016 6:14 PM

## 2016-05-09 NOTE — Telephone Encounter (Signed)
Noted  

## 2016-05-09 NOTE — Progress Notes (Signed)
Mild cbd dilation. Will need serial lfts to determine if ruling out CBD stone will be needed.

## 2016-05-09 NOTE — Telephone Encounter (Signed)
[name] [name], Pt called and said she is still having some episodes of the abdominal and rib pain and radiates to chest some. She has had several episodes in the last few days and she describes it as " A LITTLE SCARY, SINCE I'M NOT SURE WHAT IS GOING ON". [name] [name], please advise!

## 2016-05-10 ENCOUNTER — Encounter (HOSPITAL_COMMUNITY): Payer: Self-pay | Admitting: Anesthesiology

## 2016-05-10 ENCOUNTER — Inpatient Hospital Stay (HOSPITAL_COMMUNITY): Payer: 59 | Admitting: Anesthesiology

## 2016-05-10 ENCOUNTER — Encounter (HOSPITAL_COMMUNITY)
Admission: EM | Disposition: A | Discharge status: Home / Self Care | Payer: Self-pay | Source: Home / Self Care | Attending: Family Medicine

## 2016-05-10 ENCOUNTER — Ambulatory Visit (HOSPITAL_COMMUNITY): Payer: 59

## 2016-05-10 ENCOUNTER — Inpatient Hospital Stay (HOSPITAL_COMMUNITY): Payer: 59

## 2016-05-10 DIAGNOSIS — K8000 Calculus of gallbladder with acute cholecystitis without obstruction: Principal | ICD-10-CM

## 2016-05-10 DIAGNOSIS — R945 Abnormal results of liver function studies: Secondary | ICD-10-CM

## 2016-05-10 DIAGNOSIS — R7989 Other specified abnormal findings of blood chemistry: Secondary | ICD-10-CM

## 2016-05-10 HISTORY — PX: CHOLECYSTECTOMY: SHX55

## 2016-05-10 LAB — CBC WITH DIFFERENTIAL/PLATELET
BASOS ABS: 0 10*3/uL (ref 0.0–0.1)
BASOS PCT: 0 %
EOS ABS: 0 10*3/uL (ref 0.0–0.7)
EOS PCT: 1 %
HEMATOCRIT: 39.9 % (ref 36.0–46.0)
Hemoglobin: 13.5 g/dL (ref 12.0–15.0)
Lymphocytes Relative: 17 %
Lymphs Abs: 1.1 10*3/uL (ref 0.7–4.0)
MCH: 31 pg (ref 26.0–34.0)
MCHC: 33.8 g/dL (ref 30.0–36.0)
MCV: 91.5 fL (ref 78.0–100.0)
MONO ABS: 0.5 10*3/uL (ref 0.1–1.0)
MONOS PCT: 8 %
NEUTROS ABS: 4.8 10*3/uL (ref 1.7–7.7)
Neutrophils Relative %: 74 %
PLATELETS: 138 10*3/uL — AB (ref 150–400)
RBC: 4.36 MIL/uL (ref 3.87–5.11)
RDW: 13.7 % (ref 11.5–15.5)
WBC: 6.5 10*3/uL (ref 4.0–10.5)

## 2016-05-10 LAB — COMPREHENSIVE METABOLIC PANEL
ALK PHOS: 110 U/L (ref 38–126)
ALT: 496 U/L — ABNORMAL HIGH (ref 14–54)
ANION GAP: 10 (ref 5–15)
AST: 666 U/L — ABNORMAL HIGH (ref 15–41)
Albumin: 4.2 g/dL (ref 3.5–5.0)
BILIRUBIN TOTAL: 3.3 mg/dL — AB (ref 0.3–1.2)
BUN: 13 mg/dL (ref 6–20)
CALCIUM: 9.3 mg/dL (ref 8.9–10.3)
CO2: 22 mmol/L (ref 22–32)
Chloride: 107 mmol/L (ref 101–111)
Creatinine, Ser: 0.65 mg/dL (ref 0.44–1.00)
GFR calc Af Amer: >60 mL/min (ref >60)
GLUCOSE: 79 mg/dL (ref 65–99)
POTASSIUM: 4 mmol/L (ref 3.5–5.1)
Sodium: 139 mmol/L (ref 135–145)
TOTAL PROTEIN: 7.3 g/dL (ref 6.5–8.1)

## 2016-05-10 LAB — GLUCOSE, CAPILLARY: Glucose-Capillary: 75 mg/dL (ref 65–99)

## 2016-05-10 SURGERY — LAPAROSCOPIC CHOLECYSTECTOMY WITH INTRAOPERATIVE CHOLANGIOGRAM
Anesthesia: General

## 2016-05-10 MED ORDER — HYDROMORPHONE HCL 1 MG/ML IJ SOLN: 0.2500 mg | INTRAMUSCULAR | Status: DC | PRN

## 2016-05-10 MED ORDER — LACTATED RINGERS IV SOLN
INTRAVENOUS | Status: DC | PRN
  Administered 2016-05-10: 12:00:00 via INTRAVENOUS

## 2016-05-10 MED ORDER — MIDAZOLAM HCL 2 MG/2ML IJ SOLN
INTRAMUSCULAR | Status: AC
  Filled 2016-05-10: qty 2

## 2016-05-10 MED ORDER — HYDROMORPHONE HCL 1 MG/ML IJ SOLN
INTRAMUSCULAR | Status: AC
  Filled 2016-05-10: qty 1

## 2016-05-10 MED ORDER — HYDROMORPHONE HCL 1 MG/ML IJ SOLN
0.5000 mg | INTRAMUSCULAR | Status: DC | PRN
  Administered 2016-05-10: 0.5 mg via INTRAVENOUS

## 2016-05-10 MED ORDER — DIPHENHYDRAMINE HCL 12.5 MG/5ML PO ELIX: 12.5000 mg | ORAL_SOLUTION | Freq: Four times a day (QID) | ORAL | Status: DC | PRN

## 2016-05-10 MED ORDER — DEXAMETHASONE SODIUM PHOSPHATE 4 MG/ML IJ SOLN
INTRAMUSCULAR | Status: AC
  Filled 2016-05-10: qty 1

## 2016-05-10 MED ORDER — BUPIVACAINE HCL (PF) 0.5 % IJ SOLN
INTRAMUSCULAR | Status: DC | PRN
  Administered 2016-05-10: 10 mL

## 2016-05-10 MED ORDER — BUPIVACAINE HCL (PF) 0.5 % IJ SOLN
INTRAMUSCULAR | Status: AC
Start: 2016-05-10 — End: 2016-05-10
  Filled 2016-05-10: qty 30

## 2016-05-10 MED ORDER — ONDANSETRON HCL 4 MG/2ML IJ SOLN
4.0000 mg | Freq: Once | INTRAMUSCULAR | Status: AC
  Administered 2016-05-10: 4 mg via INTRAVENOUS

## 2016-05-10 MED ORDER — FENTANYL CITRATE (PF) 100 MCG/2ML IJ SOLN
INTRAMUSCULAR | Status: DC | PRN
  Administered 2016-05-10: 150 ug via INTRAVENOUS
  Administered 2016-05-10 (×2): 100 ug via INTRAVENOUS

## 2016-05-10 MED ORDER — SUGAMMADEX SODIUM 500 MG/5ML IV SOLN
INTRAVENOUS | Status: DC | PRN
  Administered 2016-05-10: 300 mg via INTRAVENOUS

## 2016-05-10 MED ORDER — ONDANSETRON HCL 4 MG/2ML IJ SOLN
INTRAMUSCULAR | Status: AC
  Filled 2016-05-10: qty 2

## 2016-05-10 MED ORDER — POVIDONE-IODINE 10 % EX OINT
TOPICAL_OINTMENT | CUTANEOUS | Status: AC
  Filled 2016-05-10: qty 1

## 2016-05-10 MED ORDER — ROCURONIUM BROMIDE 50 MG/5ML IV SOLN
INTRAVENOUS | Status: AC
  Filled 2016-05-10: qty 1

## 2016-05-10 MED ORDER — MIDAZOLAM HCL 5 MG/5ML IJ SOLN
INTRAMUSCULAR | Status: DC | PRN
  Administered 2016-05-10: 1 mg via INTRAVENOUS

## 2016-05-10 MED ORDER — SUCCINYLCHOLINE CHLORIDE 20 MG/ML IJ SOLN
INTRAMUSCULAR | Status: DC | PRN
  Administered 2016-05-10: 120 mg via INTRAVENOUS

## 2016-05-10 MED ORDER — LACTATED RINGERS IV SOLN
INTRAVENOUS | Status: DC
  Administered 2016-05-10: 1000 mL via INTRAVENOUS

## 2016-05-10 MED ORDER — PROPOFOL 10 MG/ML IV BOLUS
INTRAVENOUS | Status: DC | PRN
  Administered 2016-05-10: 150 mg via INTRAVENOUS

## 2016-05-10 MED ORDER — IOPAMIDOL (ISOVUE-300) INJECTION 61%
INTRAVENOUS | Status: AC
  Filled 2016-05-10: qty 50

## 2016-05-10 MED ORDER — TRAMADOL HCL 50 MG PO TABS: 50.0000 mg | ORAL_TABLET | Freq: Four times a day (QID) | ORAL | 0 refills | Status: DC | PRN

## 2016-05-10 MED ORDER — MIDAZOLAM HCL 2 MG/2ML IJ SOLN
1.0000 mg | INTRAMUSCULAR | Status: DC | PRN
  Administered 2016-05-10: 2 mg via INTRAVENOUS

## 2016-05-10 MED ORDER — ENOXAPARIN SODIUM 40 MG/0.4ML ~~LOC~~ SOLN: 40.0000 mg | SUBCUTANEOUS | Status: DC

## 2016-05-10 MED ORDER — SUCCINYLCHOLINE CHLORIDE 20 MG/ML IJ SOLN
INTRAMUSCULAR | Status: AC
  Filled 2016-05-10: qty 1

## 2016-05-10 MED ORDER — KETOROLAC TROMETHAMINE 30 MG/ML IJ SOLN
INTRAMUSCULAR | Status: AC
Start: 2016-05-10 — End: 2016-05-10
  Filled 2016-05-10: qty 1

## 2016-05-10 MED ORDER — SIMETHICONE 80 MG PO CHEW: 40.0000 mg | CHEWABLE_TABLET | Freq: Four times a day (QID) | ORAL | Status: DC | PRN

## 2016-05-10 MED ORDER — KETOROLAC TROMETHAMINE 30 MG/ML IJ SOLN
30.0000 mg | Freq: Once | INTRAMUSCULAR | Status: AC
  Administered 2016-05-10: 30 mg via INTRAVENOUS

## 2016-05-10 MED ORDER — TRAMADOL HCL 50 MG PO TABS: 50.0000 mg | ORAL_TABLET | Freq: Four times a day (QID) | ORAL | Status: DC | PRN

## 2016-05-10 MED ORDER — IOPAMIDOL (ISOVUE-300) INJECTION 61%
INTRAVENOUS | Status: DC | PRN
  Administered 2016-05-10: 7.5 mL

## 2016-05-10 MED ORDER — SODIUM CHLORIDE 0.9 % IV SOLN
INTRAVENOUS | Status: DC
  Administered 2016-05-10: 1000 mL via INTRAVENOUS

## 2016-05-10 MED ORDER — PROPOFOL 10 MG/ML IV BOLUS
INTRAVENOUS | Status: AC
  Filled 2016-05-10: qty 20

## 2016-05-10 MED ORDER — DEXAMETHASONE SODIUM PHOSPHATE 4 MG/ML IJ SOLN
4.0000 mg | Freq: Once | INTRAMUSCULAR | Status: AC
  Administered 2016-05-10: 4 mg via INTRAVENOUS

## 2016-05-10 MED ORDER — FENTANYL CITRATE (PF) 250 MCG/5ML IJ SOLN
INTRAMUSCULAR | Status: AC
  Filled 2016-05-10: qty 5

## 2016-05-10 MED ORDER — ROCURONIUM BROMIDE 100 MG/10ML IV SOLN
INTRAVENOUS | Status: DC | PRN
  Administered 2016-05-10: 15 mg via INTRAVENOUS
  Administered 2016-05-10: 5 mg via INTRAVENOUS

## 2016-05-10 MED ORDER — 0.9 % SODIUM CHLORIDE (POUR BTL) OPTIME
TOPICAL | Status: DC | PRN
  Administered 2016-05-10: 1000 mL

## 2016-05-10 MED ORDER — FENTANYL CITRATE (PF) 100 MCG/2ML IJ SOLN
INTRAMUSCULAR | Status: AC
  Filled 2016-05-10: qty 2

## 2016-05-10 MED ORDER — DIPHENHYDRAMINE HCL 50 MG/ML IJ SOLN: 12.5000 mg | Freq: Four times a day (QID) | INTRAMUSCULAR | Status: DC | PRN

## 2016-05-10 MED ORDER — LIDOCAINE HCL (PF) 1 % IJ SOLN
INTRAMUSCULAR | Status: AC
  Filled 2016-05-10: qty 5

## 2016-05-10 SURGICAL SUPPLY — 48 items
APPLIER CLIP LAPSCP 10X32 DD (CLIP) ×3 IMPLANT
BAG HAMPER (MISCELLANEOUS) ×3 IMPLANT
BAG SPEC RTRVL LRG 6X4 10 (ENDOMECHANICALS) ×1
CATH CHOLANGIOGRAM 4.5FR (CATHETERS) ×3 IMPLANT
CHLORAPREP W/TINT 26ML (MISCELLANEOUS) ×3 IMPLANT
CLOTH BEACON ORANGE TIMEOUT ST (SAFETY) ×3 IMPLANT
COVER LIGHT HANDLE STERIS (MISCELLANEOUS) ×6 IMPLANT
COVER MAYO STAND XLG (DRAPE) ×3 IMPLANT
DECANTER SPIKE VIAL GLASS SM (MISCELLANEOUS) ×6 IMPLANT
DRAPE C-ARM FOLDED MOBILE STRL (DRAPES) ×3 IMPLANT
ELECT REM PT RETURN 9FT ADLT (ELECTROSURGICAL) ×3
ELECTRODE REM PT RTRN 9FT ADLT (ELECTROSURGICAL) ×1 IMPLANT
FILTER SMOKE EVAC LAPAROSHD (FILTER) ×3 IMPLANT
FORMALIN 10 PREFIL 120ML (MISCELLANEOUS) ×3 IMPLANT
GLOVE BIOGEL M 7.0 STRL (GLOVE) ×2 IMPLANT
GLOVE BIOGEL PI IND STRL 7.0 (GLOVE) ×1 IMPLANT
GLOVE BIOGEL PI INDICATOR 7.0 (GLOVE) ×10
GLOVE ECLIPSE 6.5 STRL STRAW (GLOVE) ×2 IMPLANT
GLOVE SURG SS PI 7.5 STRL IVOR (GLOVE) ×6 IMPLANT
GOWN STRL REUS W/ TWL LRG LVL3 (GOWN DISPOSABLE) ×2 IMPLANT
GOWN STRL REUS W/ TWL XL LVL3 (GOWN DISPOSABLE) ×1 IMPLANT
GOWN STRL REUS W/TWL LRG LVL3 (GOWN DISPOSABLE) ×6
GOWN STRL REUS W/TWL XL LVL3 (GOWN DISPOSABLE) ×3
HEMOSTAT SNOW SURGICEL 2X4 (HEMOSTASIS) ×3 IMPLANT
INST SET LAPROSCOPIC AP (KITS) ×3 IMPLANT
KIT ROOM TURNOVER APOR (KITS) ×3 IMPLANT
MANIFOLD NEPTUNE II (INSTRUMENTS) ×3 IMPLANT
NEEDLE INSUFFLATION 120MM (ENDOMECHANICALS) ×3 IMPLANT
NS IRRIG 1000ML POUR BTL (IV SOLUTION) ×3 IMPLANT
PACK LAP CHOLE LZT030E (CUSTOM PROCEDURE TRAY) ×3 IMPLANT
PAD ARMBOARD 7.5X6 YLW CONV (MISCELLANEOUS) ×3 IMPLANT
POUCH SPECIMEN RETRIEVAL 10MM (ENDOMECHANICALS) ×3 IMPLANT
SET BASIN LINEN APH (SET/KITS/TRAYS/PACK) ×3 IMPLANT
SLEEVE ENDOPATH XCEL 5M (ENDOMECHANICALS) ×3 IMPLANT
SPONGE GAUZE 2X2 8PLY STER LF (GAUZE/BANDAGES/DRESSINGS) ×4
SPONGE GAUZE 2X2 8PLY STRL LF (GAUZE/BANDAGES/DRESSINGS) ×8 IMPLANT
STAPLER VISISTAT (STAPLE) ×3 IMPLANT
SUT VICRYL 0 UR6 27IN ABS (SUTURE) ×3 IMPLANT
SYR 20CC LL (SYRINGE) ×3 IMPLANT
SYR 30ML LL (SYRINGE) ×3 IMPLANT
SYR CONTROL 10ML LL (SYRINGE) ×3 IMPLANT
TAPE PAPER 2X10 WHT MICROPORE (GAUZE/BANDAGES/DRESSINGS) ×2 IMPLANT
TROCAR ENDO BLADELESS 11MM (ENDOMECHANICALS) ×3 IMPLANT
TROCAR XCEL NON-BLD 5MMX100MML (ENDOMECHANICALS) ×3 IMPLANT
TROCAR XCEL UNIV SLVE 11M 100M (ENDOMECHANICALS) ×3 IMPLANT
TUBING INSUFFLATION (TUBING) ×3 IMPLANT
WARMER LAPAROSCOPE (MISCELLANEOUS) ×3 IMPLANT
YANKAUER SUCT 12FT TUBE ARGYLE (SUCTIONS) ×3 IMPLANT

## 2016-05-10 NOTE — Consult Note (Signed)
Reason for Consult: Cholecystitis, cholelithiasis Referring Physician: Dr. West Carbo is an 65 y.o. female.  HPI: Patient is a 65 year old white female who presented to the emergency room after undergoing an ultrasound of the gallbladder. She had acute onset of right upper quadrant abdominal pain with radiation to the right flank, nausea, and emesis. She states she has had these episodes intermittently for the past few weeks. She denies any fever, chills, or jaundice. Ultrasound the gallbladder revealed cholecystitis with cholelithiasis. She also had a common bile duct which measured at 7 mm. After admission, the pain medication did resolve her pain. She is pain-free at this point. She denies any fatty food intolerance.   Past Medical History:  Diagnosis Date  . Breast cancer ([place])   . Functional dyspepsia MAR 2014   APR 2014: PT DECLINED BRAVO. PPIs-PROTOIX/PRILOSEC-->DEXILANT-->NEXIUM(TOO EXPENSIVE)  . GERD (gastroesophageal reflux disease)   . High cholesterol   . Hypertension     Past Surgical History:  Procedure Laterality Date  . BREAST LUMPECTOMY  2006   Left  breast  . BREAST LUMPECTOMY  1994   Right breast-benign  . COLONOSCOPY  2009   normal colon without evidence of polyps  . ESOPHAGOGASTRODUODENOSCOPY  2009   normal stomach,esophagues without mass,reosion  . ESOPHAGOGASTRODUODENOSCOPY  09/17/2011   SLF: Mild gastritis/NO SOURCE FOR IRON DEFICINECY ANEMIA IDENTIFIED  . ESOPHAGOGASTRODUODENOSCOPY N/A 12/02/2012   SLF: 1. No source for dyspepsia identified 2. MILD Non-erosive gastritis  . GIVENS CAPSULE STUDY  09/26/2011   Procedure: GIVENS CAPSULE STUDY;  Surgeon: Dorothyann Peng, MD;  Location[facility];  Service: Endoscopy;  Laterality: N/A;  . ILEOColonoscopy  09/17/2011   SLF: Nl colonoscopy WMO/INTERNA; HEMORRHOIDS/ NO SOURCE FOR IRON DEFICIENCY ANEMA IDENTIFIED    Family History  Problem Relation Age of Onset  . Anesthesia problems Neg Hx   .  Hypotension Neg Hx   . Malignant hyperthermia Neg Hx   . Pseudochol deficiency Neg Hx     Social History:  reports that she has never smoked. She has never used smokeless tobacco. She reports that she does not drink alcohol or use drugs.  Allergies:  Allergies  Allergen Reactions  . Sulfa Antibiotics Itching and Rash  . Tape Rash    Surgical tape causes blisters and rash    Medications:  Scheduled: . calcium carbonate  2 tablet Oral BID WC   And  . cholecalciferol  800 Units Oral BID WC  . famotidine (PEPCID) IV  20 mg Intravenous Q12H  . heparin  5,000 Units Subcutaneous Once  . imipramine  20 mg Oral QHS  . letrozole  2.5 mg Oral Daily  . LORazepam  0.5 mg Oral QHS  . pantoprazole  40 mg Oral Daily  . piperacillin-tazobactam (ZOSYN)  IV  3.375 g Intravenous Q8H   Continuous:   Results for orders placed or performed during the hospital encounter of 05/09/16 (from the past 48 hour(s))  I-stat chem 8, ed     Status: Abnormal   Collection Time: 05/09/16  4:05 PM  Result Value Ref Range   Sodium 139 135 - 145 mmol/L   Potassium 4.0 3.5 - 5.1 mmol/L   Chloride 100 (L) 101 - 111 mmol/L   BUN 18 6 - 20 mg/dL   Creatinine, Ser 0.70 0.44 - 1.00 mg/dL   Glucose, Bld 127 (H) 65 - 99 mg/dL   Calcium, Ion 1.10 (L) 1.15 - 1.40 mmol/L   TCO2 27 0 - 100 mmol/L  Hemoglobin 15.3 (H) 12.0 - 15.0 g/dL   HCT 45.0 36.0 - 46.0 %  Protime-INR     Status: None   Collection Time: 05/09/16  5:43 PM  Result Value Ref Range   Prothrombin Time 13.3 11.4 - 15.2 seconds   INR 1.01   APTT     Status: Abnormal   Collection Time: 05/09/16  5:43 PM  Result Value Ref Range   aPTT 48 (H) 24 - 36 seconds    Comment:        IF BASELINE aPTT IS ELEVATED, SUGGEST PATIENT RISK ASSESSMENT BE USED TO DETERMINE APPROPRIATE ANTICOAGULANT THERAPY.   Troponin I     Status: None   Collection Time: 05/09/16  5:43 PM  Result Value Ref Range   Troponin I <0.03 <0.03 ng/mL  Surgical PCR screen     Status:  None   Collection Time: 05/09/16  8:00 PM  Result Value Ref Range   MRSA, PCR NEGATIVE NEGATIVE   Staphylococcus aureus NEGATIVE NEGATIVE    Comment:        The Xpert SA Assay (FDA approved for NASAL specimens in patients over 75 years of age), is one component of a comprehensive surveillance program.  Test performance has been validated by [facility] for patients greater than or equal to 42 year old. It is not intended to diagnose infection nor to guide or monitor treatment.   CBC WITH DIFFERENTIAL     Status: Abnormal   Collection Time: 05/10/16  5:58 AM  Result Value Ref Range   WBC 6.5 4.0 - 10.5 K/uL   RBC 4.36 3.87 - 5.11 MIL/uL   Hemoglobin 13.5 12.0 - 15.0 g/dL   HCT 39.9 36.0 - 46.0 %   MCV 91.5 78.0 - 100.0 fL   MCH 31.0 26.0 - 34.0 pg   MCHC 33.8 30.0 - 36.0 g/dL   RDW 13.7 11.5 - 15.5 %   Platelets 138 (L) 150 - 400 K/uL   Neutrophils Relative % 74 %   Neutro Abs 4.8 1.7 - 7.7 K/uL   Lymphocytes Relative 17 %   Lymphs Abs 1.1 0.7 - 4.0 K/uL   Monocytes Relative 8 %   Monocytes Absolute 0.5 0.1 - 1.0 K/uL   Eosinophils Relative 1 %   Eosinophils Absolute 0.0 0.0 - 0.7 K/uL   Basophils Relative 0 %   Basophils Absolute 0.0 0.0 - 0.1 K/uL  Comprehensive metabolic panel     Status: Abnormal   Collection Time: 05/10/16  5:58 AM  Result Value Ref Range   Sodium 139 135 - 145 mmol/L   Potassium 4.0 3.5 - 5.1 mmol/L   Chloride 107 101 - 111 mmol/L   CO2 22 22 - 32 mmol/L   Glucose, Bld 79 65 - 99 mg/dL   BUN 13 6 - 20 mg/dL   Creatinine, Ser 0.65 0.44 - 1.00 mg/dL   Calcium 9.3 8.9 - 10.3 mg/dL   Total Protein 7.3 6.5 - 8.1 g/dL   Albumin 4.2 3.5 - 5.0 g/dL   AST 666 (H) 15 - 41 U/L   ALT 496 (H) 14 - 54 U/L   Alkaline Phosphatase 110 38 - 126 U/L   Total Bilirubin 3.3 (H) 0.3 - 1.2 mg/dL   GFR calc non Af Amer >60 >60 mL/min   GFR calc Af Amer >60 >60 mL/min    Comment: (NOTE) The eGFR has been calculated using the CKD EPI equation. This calculation  has not been validated in all clinical  situations. eGFR's persistently <60 mL/min signify possible Chronic Kidney Disease.    Anion gap 10 5 - 15    Dg Chest 2 View  Result Date: 05/09/2016 CLINICAL DATA:  65 year old female with progressive mid upper abdominal pain. Scheduled for gallbladder surgery tomorrow. Initial encounter. EXAM: CHEST  2 VIEW COMPARISON:  CT Abdomen and Pelvis 12/01/2014 and earlier. FINDINGS: Stable postoperative changes to the left chest wall and axilla. Lung volumes remain normal. Normal cardiac size and mediastinal contours. Visualized tracheal air column is within normal limits. Apical scarring in the left upper lung which may be sequelae of radiation. Otherwise both lungs remain clear. No pneumothorax or pleural effusion. Stable thoracic scoliosis. Osteopenia. No acute osseous abnormality identified. Negative visible bowel gas pattern. No pneumoperitoneum. IMPRESSION: Stable post treatment changes to the left chest and left lung. No acute cardiopulmonary abnormality. Electronically Signed   By: Genevie Ann M.D.   On: 05/09/2016 16:54   US Abdomen Complete  Result Date: 05/09/2016 CLINICAL DATA:  Severe epigastric pain 6 since 06/30 today. History of hypertension, breast malignancy. EXAM: ABDOMEN ULTRASOUND COMPLETE COMPARISON:  Abdominal and pelvic CT scan dated August 11, 2008 FINDINGS: Gallbladder: The gallbladder is mildly distended. There is wall thickening to 7 mm. There is a small amount of pericholecystic fluid. There is a positive sonographic Murphy's sign. There are multiple mobile gallstones measuring up to 1.6 cm. Common bile duct: Diameter: 7 mm Liver: The liver exhibits normal echotexture. There is mild intrahepatic ductal dilation. There is no focal mass. IVC: No abnormality visualized. Pancreas: The pancreatic duct is visible measuring up to 2 mm in diameter. The pancreatic parenchyma appears normal where visualized. Spleen: Size and appearance within normal  limits. Right Kidney: Length: 9.6 cm. Echogenicity within normal limits. No mass or hydronephrosis visualized. Left Kidney: Length: 9.7 cm. Echogenicity within normal limits. No mass or hydronephrosis visualized. Abdominal aorta: No aneurysm visualized. Other findings: None. IMPRESSION: 1. Gallstones with sonographic evidence of acute cholecystitis. 2. Mild intrahepatic and common bile ductal dilation. No intraluminal stones or pancreatic head mass are observed. 3. No acute abnormality observed elsewhere within the abdomen. 4. These results will be called to the ordering clinician or representative by the Radiologist Assistant, and communication documented in the PACS or zVision Dashboard. Electronically Signed   By: David  Martinique M.D.   On: 05/09/2016 14:52    ROS:  Pertinent items noted in HPI and remainder of comprehensive ROS otherwise negative.  Blood pressure 119/69, pulse 71, temperature 98.1 F (36.7 C), temperature source Oral, resp. rate 18, height 5' 6"  (1.676 m), weight 59.3 kg (130 lb 11.2 oz), SpO2 99 %. Physical Exam: Well-developed well-nourished white female in no acute distress. HEENT examination reveals no scleral icterus. Head is normocephalic, atraumatic. Neck is supple without lymphadenopathy. Lungs clear to auscultation with breath sounds bilaterally. Heart examination reveals a regular rate and rhythm without S3, S4, murmurs. Abdomen is soft with minimal tenderness in the right upper quadrant to deep palpation. No hepatosplenomegaly, masses, or hernias identified.  Assessment/Plan: Impression: Cholecystitis with cholelithiasis. She does have elevation of her LFTs as well as her total bilirubin. Plan: Will proceed with laparoscopic cholecystectomy with intraoperative cholangiograms today. The risks and benefits of the procedure including bleeding, infection, hepatobiliary injury, the possibility of an open procedure were fully explained to the patient, who gave informed  consent.  Nakeesha Bowler A 05/10/2016, 8:49 AM

## 2016-05-10 NOTE — Progress Notes (Signed)
Noted  

## 2016-05-10 NOTE — Anesthesia Procedure Notes (Signed)
Procedure Name: Intubation Date/Time: 05/10/2016 12:12 PM Performed by: Gilmer Mor R Pre-anesthesia Checklist: Patient identified, Patient being monitored, Timeout performed, Emergency Drugs available and Suction available Patient Re-evaluated:Patient Re-evaluated prior to inductionOxygen Delivery Method: Circle System Utilized Preoxygenation: Pre-oxygenation with 100% oxygen Intubation Type: IV induction, Combination inhalational/ intravenous induction, Cricoid Pressure applied and Rapid sequence Laryngoscope Size: Mac and 3 Grade View: Grade II Tube type: Oral Tube size: 7.0 mm Number of attempts: 1 Airway Equipment and Method: stylet Placement Confirmation: ETT inserted through vocal cords under direct vision,  positive ETCO2 and breath sounds checked- equal and bilateral Secured at: 21 cm Tube secured with: Tape Dental Injury: Teeth and Oropharynx as per pre-operative assessment

## 2016-05-10 NOTE — Op Note (Signed)
Patient:  [name] [name]  DOB:  01-01-1951  MRN:  [id number]   Preop Diagnosis:  Cholecystitis, cholelithiasis  Postop Diagnosis:  Same  Procedure:  Laparoscopic cholecystectomy with intraoperative cholangiograms  Surgeon:  Aviva Signs, M.D.  Anes:  Gen. endotracheal  Indications:  Patient is a 65 year old white female who presented to [facility] with worsening right upper quadrant abdominal pain. Ultrasound revealed cholelithiasis with a 7 mm common bile duct. Edema of the wall is noted. LFTs are elevated. The patient now comes the operating room for laparoscopic cholecystectomy with cholangiograms. The risks and benefits of the procedure including bleeding, infection, hepatobiliary injury, and the possibility of an open procedure were fully explained to the patient, who gave informed consent.  Procedure note:  The patient was placed the supine position. After induction of general endotracheal anesthesia, the abdomen was prepped and draped using the usual sterile technique with DuraPrep. Surgical site confirmation was performed.  An infraumbilical incision was made down to the fascia. A Veress needle was introduced into the abdominal cavity and confirmation of placement was done using the saline drop test. The abdomen was then insufflated to 16 mmHg pressure. An 11 mm trocar was introduced into the abdominal cavity under direct visualization without difficulty. The patient was placed in reverse Trendelenburg position and an additional 11 mm trocar was placed the epigastric region and 5 mm trochars were placed the right upper quadrant and right flank regions. Liver was inspected and noted within normal limits. The gallbladder wall was noted to be edematous. The gallbladder was retracted in a dynamic fashion in order to provide a critical view of the triangle of [place]. The cystic duct was first identified. Its juncture to the infundibulum was fully identified. A single endoclips placed  proximally on the cystic duct. An incision was made into the cystic duct and a cholangiocatheter was inserted. Under digital fluoroscopy, a cholangiogram was performed. No biliary dilatation was noted. No filling defects were noted. The dye flowed freely into the duodenum. The biliary system was flushed with saline. The catheter was removed and multiple endoclips were placed distally on the cystic duct. The cystic duct was then divided. The cystic artery was likewise ligated and divided. The gallbladder was freed away from the gallbladder fossa using Bovie electrocautery. The gallbladder was delivered through the epigastric trocar site using an Endo Catch bag. The gallbladder fossa was inspected and no abnormal bleeding or bile leakage was noted. Surgicel was placed the gallbladder fossa. All fluid and air were then evacuated from the abdominal cavity prior to removal of the trochars.  All wounds were irrigated with normal saline. All wounds were injected with 0.5% Sensorcaine. The infraumbilical fashion as well as epigastric fascia were reapproximated using 0 Vicryl interrupted sutures. All skin incisions were closed using staples. Betadine ointment and dry sterile dressings were applied.  All tape and needle counts were correct at the end of the procedure. Patient was extubated in the operating room and transferred to PACU in stable condition.  Complications:  None  EBL:  Minimal  Specimen:  Gallbladder

## 2016-05-10 NOTE — Progress Notes (Signed)
Patient seen while inpatient. Plans for intraoperative cholangiogram at time of cholecystectomy. Discussed with Dr. Arnoldo Morale.

## 2016-05-10 NOTE — Consult Note (Signed)
Referring Provider: Wallis Bamberg, MD Primary Care Physician:  [name], MD Primary Gastroenterologist:  Barney Drain, MD  Reason for Consultation:  Abnormal lfts  HPI: TASHEIA GILE is a 65 y.o. female admitted yesterday with acute cholecystitis. We were asked to see patient for abnormal lfts by the attending. Vision has been having some biliary colic for the past couple of weeks which is been more frequent than usual. Known history of cholelithiasis. I saw her in the office on August 22 after an episode of biliary colic lasting for proximal 0.8 hours. Since that time patient has not really returned to baseline. Yesterday morning she woke developed acute onset upper abdominal pain with radiation into the chest and back around 6 AM. Symptoms progressed throughout the day. She called me early in the morning and we obtained labs showing AST of 149, ALT 67, total bilirubin 1.4, white blood cell count 11,600, lipase normal. Stat abdominal ultrasound consistent with acute cholecystitis with mild intrahepatic biliary dilation and common bile duct minimally dilated at 7 mm. No intraluminal stones noted. She was admitted to the ER. She is on the schedule for cholecystectomy today around lunch time by Dr. Aviva Signs. This morning her AST is 666, ALT 496, total bilirubin 3.3, alkaline phosphatase normal. White blood cell count is down to 6500.  Patient tells me she's feeling much better. Her last dose of pain medication was around 2 AM. She had mild to moderate pain at that time. After her initial 2 doses of pain medication in the ER, she tells me that her pain has nearly resolved. She had a single episode of vomiting in the ER. No issues with her bowels. No blood in the stool or melena.   Prior to Admission medications   Medication Sig Start Date End Date Taking? Authorizing Provider  atorvastatin (LIPITOR) 40 MG tablet Take 40 mg by mouth at bedtime.   Yes Historical Provider, MD  Calcium  Carbonate-Vitamin D (CALCIUM 600+D) 600-400 MG-UNIT tablet Take 2 tablets by mouth 2 (two) times daily.   Yes Historical Provider, MD  clobetasol ointment (TEMOVATE) [id number] % Apply 1 application topically 2 (two) times daily as needed (for irritation).   Yes Historical Provider, MD  diphenhydrAMINE (BENADRYL) 25 MG tablet Take 50 mg by mouth every 6 (six) hours as needed for allergies.    Yes Historical Provider, MD  docusate sodium (COLACE) 100 MG capsule Take 200 mg by mouth at bedtime as needed for mild constipation.    Yes Historical Provider, MD  enalapril (VASOTEC) 10 MG tablet Take 10 mg by mouth daily.     Yes Historical Provider, MD  esomeprazole (NEXIUM) 40 MG capsule Take 40 mg by mouth daily before breakfast.   Yes Historical Provider, MD  ferrous sulfate 325 (65 FE) MG tablet Take 325 mg by mouth every other day.   Yes Historical Provider, MD  imipramine (TOFRANIL) 10 MG tablet Take 20 mg by mouth at bedtime.   Yes Historical Provider, MD  letrozole (FEMARA) 2.5 MG tablet Take 1 tablet (2.5 mg total) by mouth daily. 02/07/16  Yes Patrici Ranks, MD  LORazepam (ATIVAN) 0.5 MG tablet Take 0.5 mg by mouth at bedtime.   Yes Historical Provider, MD  valACYclovir (VALTREX) 500 MG tablet Take 500 mg by mouth as needed (for outbreaks).    Yes Historical Provider, MD    Current Facility-Administered Medications  Medication Dose Route Frequency Provider Last Rate Last Dose  . acetaminophen (TYLENOL) tablet 650  mg  650 mg Oral Q6H PRN Vianne Bulls, MD       Or  . acetaminophen (TYLENOL) suppository 650 mg  650 mg Rectal Q6H PRN Vianne Bulls, MD      . bisacodyl (DULCOLAX) EC tablet 5 mg  5 mg Oral Daily PRN Vianne Bulls, MD      . calcium carbonate (OS-CAL - dosed in mg of elemental calcium) tablet 1,000 mg of elemental calcium  2 tablet Oral BID WC Vianne Bulls, MD       And  . cholecalciferol (VITAMIN D) tablet 800 Units  800 Units Oral BID WC Ilene Qua Opyd, MD      .  diphenhydrAMINE (BENADRYL) tablet 25 mg  25 mg Oral Q6H PRN Vianne Bulls, MD      . famotidine (PEPCID) IVPB 20 mg premix  20 mg Intravenous Q12H Vianne Bulls, MD 100 mL/hr at 05/09/16 1830 20 mg at 05/09/16 1830  . fentaNYL (SUBLIMAZE) injection 25 mcg  25 mcg Intravenous Q1H PRN Vianne Bulls, MD   25 mcg at 05/10/16 0049  . heparin injection 5,000 Units  5,000 Units Subcutaneous Once Vianne Bulls, MD   Stopped at 05/09/16 1945  . hydrALAZINE (APRESOLINE) injection 5 mg  5 mg Intravenous Q4H PRN Vianne Bulls, MD      . imipramine (TOFRANIL) tablet 20 mg  20 mg Oral QHS Vianne Bulls, MD   20 mg at 05/09/16 2226  . letrozole [facility]) tablet 2.5 mg  2.5 mg Oral Daily Ilene Qua Opyd, MD      . LORazepam (ATIVAN) tablet 0.5 mg  0.5 mg Oral QHS Ilene Qua Opyd, MD   0.5 mg at 05/09/16 2152  . ondansetron (ZOFRAN) tablet 4 mg  4 mg Oral Q6H PRN Vianne Bulls, MD       Or  . ondansetron (ZOFRAN) injection 4 mg  4 mg Intravenous Q6H PRN Ilene Qua Opyd, MD      . pantoprazole (PROTONIX) EC tablet 40 mg  40 mg Oral Daily Ilene Qua Opyd, MD      . piperacillin-tazobactam (ZOSYN) IVPB 3.375 g  3.375 g Intravenous Q8H Vianne Bulls, MD   3.375 g at 05/10/16 0527  . polyethylene glycol (MIRALAX / GLYCOLAX) packet 17 g  17 g Oral Daily PRN Vianne Bulls, MD      . zolpidem (AMBIEN) tablet 5 mg  5 mg Oral QHS PRN Vianne Bulls, MD        Allergies as of 05/09/2016 - Review Complete 05/09/2016  Allergen Reaction Noted  . Sulfa antibiotics Itching and Rash 05/09/2016    Past Medical History:  Diagnosis Date  . Breast cancer ([place])   . Functional dyspepsia MAR 2014   APR 2014: PT DECLINED BRAVO. PPIs-PROTOIX/PRILOSEC-->DEXILANT-->NEXIUM(TOO EXPENSIVE)  . GERD (gastroesophageal reflux disease)   . High cholesterol   . Hypertension     Past Surgical History:  Procedure Laterality Date  . BREAST LUMPECTOMY  2006   Left  breast  . BREAST LUMPECTOMY  1994   Right breast-benign  .  COLONOSCOPY  2009   normal colon without evidence of polyps  . ESOPHAGOGASTRODUODENOSCOPY  2009   normal stomach,esophagues without mass,reosion  . ESOPHAGOGASTRODUODENOSCOPY  09/17/2011   SLF: Mild gastritis/NO SOURCE FOR IRON DEFICINECY ANEMIA IDENTIFIED  . ESOPHAGOGASTRODUODENOSCOPY N/A 12/02/2012   SLF: 1. No source for dyspepsia identified 2. MILD Non-erosive gastritis  . GIVENS CAPSULE STUDY  09/26/2011  Procedure: GIVENS CAPSULE STUDY;  Surgeon: Dorothyann Peng, MD;  Location: [facility];  Service: Endoscopy;  Laterality: N/A;  . ILEOColonoscopy  09/17/2011   SLF: Nl colonoscopy WMO/INTERNA; HEMORRHOIDS/ NO SOURCE FOR IRON DEFICIENCY ANEMA IDENTIFIED    Family History  Problem Relation Age of Onset  . Anesthesia problems Neg Hx   . Hypotension Neg Hx   . Malignant hyperthermia Neg Hx   . Pseudochol deficiency Neg Hx     Social History   Social History  . Marital status: Married    Spouse name: N/A  . Number of children: N/A  . Years of education: N/A   Occupational History  . Not on file.   Social History Main Topics  . Smoking status: Never Smoker  . Smokeless tobacco: Never Used  . Alcohol use No  . Drug use: No  . Sexual activity: Not on file   Other Topics Concern  . Not on file   Social History Narrative  . No narrative on file     ROS:  General: Negative for anorexia, weight loss, fever, chills, fatigue, weakness. Eyes: Negative for vision changes.  ENT: Negative for hoarseness, difficulty swallowing , nasal congestion. CV: Negative for chest pain, angina, palpitations, dyspnea on exertion, peripheral edema.  Respiratory: Negative for dyspnea at rest, dyspnea on exertion, cough, sputum, wheezing.  GI: See history of present illness. GU:  Negative for dysuria, hematuria, urinary incontinence, urinary frequency, nocturnal urination.  MS: Negative for joint pain, low back pain.  Derm: Negative for rash or itching.  Neuro: Negative for weakness,  abnormal sensation, seizure, frequent headaches, memory loss, confusion.  Psych: Negative for anxiety, depression, suicidal ideation, hallucinations.  Endo: Negative for unusual weight change.  Heme: Negative for bruising or bleeding. Allergy: Negative for rash or hives.       Physical Examination: Vital signs in last 24 hours: Temp:  [97.6 F (36.4 C)-98.1 F (36.7 C)] 98.1 F (36.7 C) (09/07 0444) Pulse Rate:  [59-115] 71 (09/07 0444) Resp:  [12-22] 18 (09/07 0444) BP: (118-141)/(58-88) 119/69 (09/07 0444) SpO2:  [98 %-100 %] 99 % (09/07 0444) Weight:  [130 lb 11.2 oz (59.3 kg)-134 lb (60.8 kg)] 130 lb 11.2 oz (59.3 kg) (09/07 0444)    General: Well-nourished, well-developed in no acute distress.  Head: Normocephalic, atraumatic.   Eyes: Conjunctiva pink, no icterus. Mouth: Oropharyngeal mucosa moist and pink , no lesions erythema or exudate. Neck: Supple without thyromegaly, masses, or lymphadenopathy.  Lungs: Clear to auscultation bilaterally.  Heart: Regular rate and rhythm, no murmurs rubs or gallops.  Abdomen: Bowel sounds are normal, no tenderness on exam, nondistended, no hepatosplenomegaly or masses, no abdominal bruits or    hernia , no rebound or guarding.   Rectal: Not performed Extremities: No lower extremity edema, clubbing, deformity.  Neuro: Alert and oriented x 4 , grossly normal neurologically.  Skin: Warm and dry, no rash or jaundice.   Psych: Alert and cooperative, normal mood and affect.        Intake/Output from previous day: 09/06 0701 - 09/07 0700 In: 1260 [I.V.:1110; IV Piggyback:150] Out: 200 [Urine:200] Intake/Output this shift: No intake/output data recorded.  Lab Results: CBC  Recent Labs  05/09/16 1133 05/09/16 1605 05/10/16 0558  WBC 11.6*  --  6.5  HGB 14.5 15.3* 13.5  HCT 42.5 45.0 39.9  MCV 90.4  --  91.5  PLT 191  --  138*   BMET  Recent Labs  05/09/16 1605 05/10/16 0558  NA  139 139  K 4.0 4.0  CL 100* 107  CO2  --   22  GLUCOSE 127* 79  BUN 18 13  CREATININE 0.70 0.65  CALCIUM  --  9.3   LFT  Recent Labs  05/09/16 1133 05/10/16 0558  BILITOT 1.4* 3.3*  BILIDIR 0.5  --   IBILI 0.9  --   ALKPHOS 63 110  AST 149* 666*  ALT 67* 496*  PROT 8.1 7.3  ALBUMIN 4.9 4.2    Lipase  Recent Labs  05/09/16 1133  LIPASE 32    PT/INR  Recent Labs  05/09/16 1743  LABPROT 13.3  INR 1.01      Imaging Studies: Dg Chest 2 View  Result Date: 05/09/2016 CLINICAL DATA:  65 year old female with progressive mid upper abdominal pain. Scheduled for gallbladder surgery tomorrow. Initial encounter. EXAM: CHEST  2 VIEW COMPARISON:  CT Abdomen and Pelvis 12/01/2014 and earlier. FINDINGS: Stable postoperative changes to the left chest wall and axilla. Lung volumes remain normal. Normal cardiac size and mediastinal contours. Visualized tracheal air column is within normal limits. Apical scarring in the left upper lung which may be sequelae of radiation. Otherwise both lungs remain clear. No pneumothorax or pleural effusion. Stable thoracic scoliosis. Osteopenia. No acute osseous abnormality identified. Negative visible bowel gas pattern. No pneumoperitoneum. IMPRESSION: Stable post treatment changes to the left chest and left lung. No acute cardiopulmonary abnormality. Electronically Signed   By: Genevie Ann M.D.   On: 05/09/2016 16:54   US Abdomen Complete  Result Date: 05/09/2016 CLINICAL DATA:  Severe epigastric pain 6 since 06/30 today. History of hypertension, breast malignancy. EXAM: ABDOMEN ULTRASOUND COMPLETE COMPARISON:  Abdominal and pelvic CT scan dated August 11, 2008 FINDINGS: Gallbladder: The gallbladder is mildly distended. There is wall thickening to 7 mm. There is a small amount of pericholecystic fluid. There is a positive sonographic Murphy's sign. There are multiple mobile gallstones measuring up to 1.6 cm. Common bile duct: Diameter: 7 mm Liver: The liver exhibits normal echotexture. There is mild  intrahepatic ductal dilation. There is no focal mass. IVC: No abnormality visualized. Pancreas: The pancreatic duct is visible measuring up to 2 mm in diameter. The pancreatic parenchyma appears normal where visualized. Spleen: Size and appearance within normal limits. Right Kidney: Length: 9.6 cm. Echogenicity within normal limits. No mass or hydronephrosis visualized. Left Kidney: Length: 9.7 cm. Echogenicity within normal limits. No mass or hydronephrosis visualized. Abdominal aorta: No aneurysm visualized. Other findings: None. IMPRESSION: 1. Gallstones with sonographic evidence of acute cholecystitis. 2. Mild intrahepatic and common bile ductal dilation. No intraluminal stones or pancreatic head mass are observed. 3. No acute abnormality observed elsewhere within the abdomen. 4. These results will be called to the ordering clinician or representative by the Radiologist Assistant, and communication documented in the PACS or zVision Dashboard. Electronically Signed   By: David  Martinique M.D.   On: 05/09/2016 14:52  [4 week]   Impression/Plan: 65 year old female with recent progressive biliary colic who presents with acute cholecystitis. Bump in her AST/ALT/total bilirubin, possibly delayed effect. Suspect she may have had biliary obstruction due to stone, given her clinical improvement suspect this has now resolved. I have spoken to Dr. Arnoldo Morale. Given patient's clinical improvement, plans are for cholecystectomy with intraoperative cholangiogram. No need for MRCP/ERCP at this time. We will continue to follow peripherally.  We would like to thank you for the opportunity to participate in the care of Quanika C Pennypacker.  Laureen Ochs. [facility] [phone] 9/7/20178:42 AM     LOS: 1 day

## 2016-05-10 NOTE — Discharge Summary (Signed)
Physician Discharge Summary  [name] [name] OVF:[id number] DOB: [birth year] DOA: 05/09/2016  PCP: [name], MD  Admit date: 05/09/2016 Discharge date: 05/10/2016  Recommendations for Outpatient Follow-up:  1. Please schedule follow up with GI physician and your PCP  Follow-up Information    Jamesetta So, MD. Schedule an appointment as soon as possible for a visit on 05/17/2016.   Specialty:  General Surgery Contact information: [address] [address] [phone]        Neil Crouch, PA-C Follow up in 1 month(s).   Specialty:  Gastroenterology Contact information: [address] [place] [address] [phone]        [name], MD Follow up in 2 week(s).   Specialty:  Family Medicine Contact information: [place] [address] [phone]          Discharge Diagnoses:  1. Acute Cholecystitis  Discharge Condition: stable Disposition: home with no services Diet recommendation: low fat diet  Filed Weights   05/09/16 1900 05/10/16 0444 05/10/16 1123  Weight: 60.1 kg (132 lb 8 oz) 59.3 kg (130 lb 11.2 oz) 59 kg (130 lb)    History of present illness: [name] [name] is a 65 y.o. female with medical history significant for cancer of the left breast status post lumpectomy and chemotherapy, GERD, gastritis, hypertension, and cholelithiasis who presents to the emergency department for evaluation of acute, severe, persistent pain in the right upper quadrant of her abdomen. Patient is had occasional biliary colic for more than one year and has been followed by gastroenterology. She had some more severe and frequent episodes for the past couple weeks, and then today developed severe pain at approximately 6 AM and this has persisted. She was evaluated with ultrasound and LFTs through her gastroenterology clinic and there was sonographic evidence for acute cholecystitis. Mild intrahepatic  and common bile duct dilation is noted on the ultrasound, but no intraluminal stone or pancreatic head mass is identified. AST was elevated to 149, ALT to 67, and total bilirubin mildly elevated to 1.4. She was directed to the emergency department for further evaluation of this. Patient had experienced chills couple days ago, but denies fevers. There has been no chest pain, palpitations, dyspnea, or cough.   Hospital Course:  Patient was admitted to [facility].  She was seen and evaluated by Dr. Arnoldo Morale of General Surgery and underwent laparoscopic cholecystectomy on 05/10/16.  She tolerated the procedure well and was stable for discharge on the evening of 05/10/16 after tolerating a diet.    Discharge Instructions  Discharge Instructions    Call MD for:  persistant nausea and vomiting    Complete by:  As directed   Call MD for:  redness, tenderness, or signs of infection (pain, swelling, redness, odor or green/yellow discharge around incision site)    Complete by:  As directed   Call MD for:  severe uncontrolled pain    Complete by:  As directed   Call MD for:  temperature >100.4    Complete by:  As directed   Diet - low sodium heart healthy    Complete by:  As directed   Increase activity slowly    Complete by:  As directed     Current Discharge Medication List    START taking these medications   Details  traMADol (ULTRAM) 50 MG tablet Take 1 tablet (50 mg total) by mouth every 6 (six) hours as  needed. Qty: 40 tablet, Refills: 0      CONTINUE these medications which have NOT CHANGED   Details  atorvastatin (LIPITOR) 40 MG tablet Take 40 mg by mouth at bedtime.    Calcium Carbonate-Vitamin D (CALCIUM 600+D) 600-400 MG-UNIT tablet Take 2 tablets by mouth 2 (two) times daily.    clobetasol ointment (TEMOVATE) 7.67 % Apply 1 application topically 2 (two) times daily as needed (for irritation).    diphenhydrAMINE (BENADRYL) 25 MG tablet Take 50 mg by mouth every 6 (six) hours as  needed for allergies.     docusate sodium (COLACE) 100 MG capsule Take 200 mg by mouth at bedtime as needed for mild constipation.     enalapril (VASOTEC) 10 MG tablet Take 10 mg by mouth daily.      esomeprazole (NEXIUM) 40 MG capsule Take 40 mg by mouth daily before breakfast.    ferrous sulfate 325 (65 FE) MG tablet Take 325 mg by mouth every other day.    imipramine (TOFRANIL) 10 MG tablet Take 20 mg by mouth at bedtime.    letrozole (FEMARA) 2.5 MG tablet Take 1 tablet (2.5 mg total) by mouth daily. Qty: 90 tablet, Refills: 1   Associated Diagnoses: Personal history of malignant neoplasm of breast    LORazepam (ATIVAN) 0.5 MG tablet Take 0.5 mg by mouth at bedtime.    valACYclovir (VALTREX) 500 MG tablet Take 500 mg by mouth as needed (for outbreaks).  Refills: 12       Allergies  Allergen Reactions  . Sulfa Antibiotics Itching and Rash  . Tape Rash    Surgical tape causes blisters and rash    The results of significant diagnostics from this hospitalization (including imaging, microbiology, ancillary and laboratory) are listed below for reference.    Significant Diagnostic Studies: Dg Chest 2 View  Result Date: 05/09/2016 CLINICAL DATA:  65 year old female with progressive mid upper abdominal pain. Scheduled for gallbladder surgery tomorrow. Initial encounter. EXAM: CHEST  2 VIEW COMPARISON:  CT Abdomen and Pelvis 12/01/2014 and earlier. FINDINGS: Stable postoperative changes to the left chest wall and axilla. Lung volumes remain normal. Normal cardiac size and mediastinal contours. Visualized tracheal air column is within normal limits. Apical scarring in the left upper lung which may be sequelae of radiation. Otherwise both lungs remain clear. No pneumothorax or pleural effusion. Stable thoracic scoliosis. Osteopenia. No acute osseous abnormality identified. Negative visible bowel gas pattern. No pneumoperitoneum. IMPRESSION: Stable post treatment changes to the left chest  and left lung. No acute cardiopulmonary abnormality. Electronically Signed   By: Genevie Ann M.D.   On: 05/09/2016 16:54   Dg Cholangiogram Operative  Result Date: 05/10/2016 CLINICAL DATA:  Intraoperative cholangiogram during laparoscopic cholecystectomy. EXAM: INTRAOPERATIVE CHOLANGIOGRAM FLUOROSCOPY TIME:  24 seconds COMPARISON:  Right upper quadrant abdominal ultrasound - 05/09/2016 FINDINGS: Intraoperative cholangiographic images of the right upper abdominal quadrant during laparoscopic cholecystectomy are provided for review. Surgical clips overlie the expected location of the gallbladder fossa. Contrast injection demonstrates selective cannulation of the central aspect of the cystic duct. There is passage of contrast through the central aspect of the cystic duct with filling of a non dilated common bile duct. There is passage of contrast though the CBD and into the descending portion of the duodenum. There is minimal reflux of injected contrast into the common hepatic duct and central aspect of the non dilated intrahepatic biliary system. There are no discrete filling defects within the opacified portions of the biliary system to suggest the  presence of choledocholithiasis. IMPRESSION: No evidence of choledocholithiasis. Electronically Signed   By: Sandi Mariscal M.D.   On: 05/10/2016 13:50   US Abdomen Complete  Result Date: 05/09/2016 CLINICAL DATA:  Severe epigastric pain 6 since 06/30 today. History of hypertension, breast malignancy. EXAM: ABDOMEN ULTRASOUND COMPLETE COMPARISON:  Abdominal and pelvic CT scan dated August 11, 2008 FINDINGS: Gallbladder: The gallbladder is mildly distended. There is wall thickening to 7 mm. There is a small amount of pericholecystic fluid. There is a positive sonographic Murphy's sign. There are multiple mobile gallstones measuring up to 1.6 cm. Common bile duct: Diameter: 7 mm Liver: The liver exhibits normal echotexture. There is mild intrahepatic ductal dilation. There  is no focal mass. IVC: No abnormality visualized. Pancreas: The pancreatic duct is visible measuring up to 2 mm in diameter. The pancreatic parenchyma appears normal where visualized. Spleen: Size and appearance within normal limits. Right Kidney: Length: 9.6 cm. Echogenicity within normal limits. No mass or hydronephrosis visualized. Left Kidney: Length: 9.7 cm. Echogenicity within normal limits. No mass or hydronephrosis visualized. Abdominal aorta: No aneurysm visualized. Other findings: None. IMPRESSION: 1. Gallstones with sonographic evidence of acute cholecystitis. 2. Mild intrahepatic and common bile ductal dilation. No intraluminal stones or pancreatic head mass are observed. 3. No acute abnormality observed elsewhere within the abdomen. 4. These results will be called to the ordering clinician or representative by the Radiologist Assistant, and communication documented in the PACS or zVision Dashboard. Electronically Signed   By: David  Martinique M.D.   On: 05/09/2016 14:52    Microbiology: Recent Results (from the past 240 hour(s))  Surgical PCR screen     Status: None   Collection Time: 05/09/16  8:00 PM  Result Value Ref Range Status   MRSA, PCR NEGATIVE NEGATIVE Final   Staphylococcus aureus NEGATIVE NEGATIVE Final    Comment:        The Xpert SA Assay (FDA approved for NASAL specimens in patients over 42 years of age), is one component of a comprehensive surveillance program.  Test performance has been validated by [facility] for patients greater than or equal to 104 year old. It is not intended to diagnose infection nor to guide or monitor treatment.      Labs: Basic Metabolic Panel:  Recent Labs Lab 05/09/16 1605 05/10/16 0558  NA 139 139  K 4.0 4.0  CL 100* 107  CO2  --  22  GLUCOSE 127* 79  BUN 18 13  CREATININE 0.70 0.65  CALCIUM  --  9.3   Liver Function Tests:  Recent Labs Lab 05/09/16 1133 05/10/16 0558  AST 149* 666*  ALT 67* 496*  ALKPHOS 63 110    BILITOT 1.4* 3.3*  PROT 8.1 7.3  ALBUMIN 4.9 4.2    Recent Labs Lab 05/09/16 1133  LIPASE 32   No results for input(s): AMMONIA in the last 168 hours. CBC:  Recent Labs Lab 05/09/16 1133 05/09/16 1605 05/10/16 0558  WBC 11.6*  --  6.5  NEUTROABS 9.6*  --  4.8  HGB 14.5 15.3* 13.5  HCT 42.5 45.0 39.9  MCV 90.4  --  91.5  PLT 191  --  138*   Cardiac Enzymes:  Recent Labs Lab 05/09/16 1743  TROPONINI <0.03   BNP: BNP (last 3 results) No results for input(s): BNP in the last 8760 hours.  ProBNP (last 3 results) No results for input(s): PROBNP in the last 8760 hours.  CBG:  Recent Labs Lab 05/10/16 0728  GLUCAP  23    Principal Problem:   Acute calculous cholecystitis Active Problems:   Hx Breast cancer, IDC, Left, Stage II, receptor+, Her2 -   GERD   Anxiety   Hypertension   Abnormal LFTs   Time coordinating discharge: <30 minutes  Signed:  05/10/2016, 5:26 PM

## 2016-05-10 NOTE — Progress Notes (Signed)
PROGRESS NOTE    [name] [name]  BWG:[id number] DOB: [birth year] DOA: 05/09/2016 PCP: [name], MD    Brief Narrative:  [name] [name] is a 65 y.o. female with medical history significant for cancer of the left breast status post lumpectomy and chemotherapy, GERD, gastritis, hypertension, and cholelithiasis who presents to the emergency department for evaluation of acute, severe, persistent pain in the right upper quadrant of her abdomen. Patient is had occasional biliary colic for more than one year and has been followed by gastroenterology. She had some more severe and frequent episodes for the past couple weeks, and then today developed severe pain at approximately 6 AM and this has persisted. She was evaluated with ultrasound and LFTs through her gastroenterology clinic and there was sonographic evidence for acute cholecystitis. Mild intrahepatic and common bile duct dilation is noted on the ultrasound, but no intraluminal stone or pancreatic head mass is identified. AST was elevated to 149, ALT to 67, and total bilirubin mildly elevated to 1.4. She was directed to the emergency department for further evaluation of this. Patient was admitted and underwent laprascopic cholecystectomy on 05/10/16 by Dr. Arnoldo Morale.   Assessment & Plan:   Principal Problem:   Acute calculous cholecystitis Active Problems:   Hx Breast cancer, IDC, Left, Stage II, receptor+, Her2 -   GERD   Anxiety   Hypertension   Abnormal LFTs    Acute cholecystitis  - Pt reports occasional biliary colic for years, more frequent and severe episodes in last 2 wks, and now severe and persisting pain since early in the am on day of presentation - US findings consistent with acute cholecystitis; mild intrahepatic and CBD dilation noted without intraluminal stone of pancreatic head mass - s/p lap chole by Dr. Arnoldo Morale this morning   Hx of left breast cancer  - Underwent lumpectomy in 2006, followed by 6 cycles TAC, 5 yrs  tamoxifen complete in 2012  - Currently taking letrozole and followed by Dr. [name] of oncology  - Letrozole to resume post op  GERD  - Mild gastritis on EGD in 2013, but normal esophagus  - Managed with Prilosec at home - Had been worse lately and she was started on imipramine by GI given the gastritis hx  - Continue PPI with Protonix while in hospital - can restart PO PPI  Anxiety, insomnia  - Stable  - Managed with low-dose Ativan qHS, will continue    Hypertension  - At goal currently  - Managed with enalapril at home - will restart enalapril post op    DVT prophylaxis: SCD's Code Status: Full Family Communication: no family bedside Disposition Plan: discharge tonight or tomorrow am   Consultants:   General Surgery  Gastroenterology  Procedures:   Lap Cholecystectomy  Antimicrobials:   Pre- op only    Subjective: Patient laying with eyes closed.  She mentions she feels relatively well after surgery and is only minimally nauseous.  She says Dr. Arnoldo Morale cleared her for discharge if she feels well enough to go home tonight.  Minimal pain which patient states is significantly improved from yesterday. Denies chest pain, chest pressure, shortness of breath but does report some bloating.  Objective: Vitals:   05/10/16 1330 05/10/16 1345 05/10/16 1400 05/10/16 1425  BP: 139/80 131/83 122/80 121/75  Pulse: 80 91 77 75  Resp: _0 Temp:    97.8 F (36.6 C)  TempSrc:      SpO2: 100% 100% 100% 97%  Weight:  Height:        Intake/Output Summary (Last 24 hours) at 05/10/16 1545 Last data filed at 05/10/16 1430  Gross per 24 hour  Intake             3260 ml  Output              210 ml  Net             3050 ml   Filed Weights   05/09/16 1900 05/10/16 0444 05/10/16 1123  Weight: 60.1 kg (132 lb 8 oz) 59.3 kg (130 lb 11.2 oz) 59 kg (130 lb)    Examination:  General exam: Appears calm and comfortable  Respiratory system: Clear to  auscultation. Respiratory effort normal. Cardiovascular system: S1 & S2 heard, RRR. No JVD, murmurs, rubs, gallops or clicks. No pedal edema. Gastrointestinal system: Abdomen is nondistended, soft and minimally tender in epigastric area. No organomegaly or masses felt. Normal bowel sounds heard.  Surgical bandages in place and are clean, dry and intact. Central nervous system: Alert and oriented. No focal neurological deficits. Extremities: Symmetric 5 x 5 power. Skin: No rashes, lesions or ulcers Psychiatry: Judgement and insight appear normal. Mood & affect appropriate.     Data Reviewed: I have personally reviewed following labs and imaging studies  CBC:  Recent Labs Lab 05/09/16 1133 05/09/16 1605 05/10/16 0558  WBC 11.6*  --  6.5  NEUTROABS 9.6*  --  4.8  HGB 14.5 15.3* 13.5  HCT 42.5 45.0 39.9  MCV 90.4  --  91.5  PLT 191  --  401*   Basic Metabolic Panel:  Recent Labs Lab 05/09/16 1605 05/10/16 0558  NA 139 139  K 4.0 4.0  CL 100* 107  CO2  --  22  GLUCOSE 127* 79  BUN 18 13  CREATININE 0.70 0.65  CALCIUM  --  9.3   GFR: Estimated Creatinine Clearance: 66.2 mL/min (by C-G formula based on SCr of 0.8 mg/dL). Liver Function Tests:  Recent Labs Lab 05/09/16 1133 05/10/16 0558  AST 149* 666*  ALT 67* 496*  ALKPHOS 63 110  BILITOT 1.4* 3.3*  PROT 8.1 7.3  ALBUMIN 4.9 4.2    Recent Labs Lab 05/09/16 1133  LIPASE 32   No results for input(s): AMMONIA in the last 168 hours. Coagulation Profile:  Recent Labs Lab 05/09/16 1743  INR 1.01   Cardiac Enzymes:  Recent Labs Lab 05/09/16 1743  TROPONINI <0.03   BNP (last 3 results) No results for input(s): PROBNP in the last 8760 hours. HbA1C: No results for input(s): HGBA1C in the last 72 hours. CBG:  Recent Labs Lab 05/10/16 0728  GLUCAP 75   Lipid Profile: No results for input(s): CHOL, HDL, LDLCALC, TRIG, CHOLHDL, LDLDIRECT in the last 72 hours. Thyroid Function Tests: No results  for input(s): TSH, T4TOTAL, FREET4, T3FREE, THYROIDAB in the last 72 hours. Anemia Panel: No results for input(s): VITAMINB12, FOLATE, FERRITIN, TIBC, IRON, RETICCTPCT in the last 72 hours. Sepsis Labs: No results for input(s): PROCALCITON, LATICACIDVEN in the last 168 hours.  Recent Results (from the past 240 hour(s))  Surgical PCR screen     Status: None   Collection Time: 05/09/16  8:00 PM  Result Value Ref Range Status   MRSA, PCR NEGATIVE NEGATIVE Final   Staphylococcus aureus NEGATIVE NEGATIVE Final    Comment:        The Xpert SA Assay (FDA approved for NASAL specimens in patients over 68 years of age), is one  component of a comprehensive surveillance program.  Test performance has been validated by [facility] for patients greater than or equal to 33 year old. It is not intended to diagnose infection nor to guide or monitor treatment.          Radiology Studies: Dg Chest 2 View  Result Date: 05/09/2016 CLINICAL DATA:  65 year old female with progressive mid upper abdominal pain. Scheduled for gallbladder surgery tomorrow. Initial encounter. EXAM: CHEST  2 VIEW COMPARISON:  CT Abdomen and Pelvis 12/01/2014 and earlier. FINDINGS: Stable postoperative changes to the left chest wall and axilla. Lung volumes remain normal. Normal cardiac size and mediastinal contours. Visualized tracheal air column is within normal limits. Apical scarring in the left upper lung which may be sequelae of radiation. Otherwise both lungs remain clear. No pneumothorax or pleural effusion. Stable thoracic scoliosis. Osteopenia. No acute osseous abnormality identified. Negative visible bowel gas pattern. No pneumoperitoneum. IMPRESSION: Stable post treatment changes to the left chest and left lung. No acute cardiopulmonary abnormality. Electronically Signed   By: Genevie Ann M.D.   On: 05/09/2016 16:54   Dg Cholangiogram Operative  Result Date: 05/10/2016 CLINICAL DATA:  Intraoperative cholangiogram  during laparoscopic cholecystectomy. EXAM: INTRAOPERATIVE CHOLANGIOGRAM FLUOROSCOPY TIME:  24 seconds COMPARISON:  Right upper quadrant abdominal ultrasound - 05/09/2016 FINDINGS: Intraoperative cholangiographic images of the right upper abdominal quadrant during laparoscopic cholecystectomy are provided for review. Surgical clips overlie the expected location of the gallbladder fossa. Contrast injection demonstrates selective cannulation of the central aspect of the cystic duct. There is passage of contrast through the central aspect of the cystic duct with filling of a non dilated common bile duct. There is passage of contrast though the CBD and into the descending portion of the duodenum. There is minimal reflux of injected contrast into the common hepatic duct and central aspect of the non dilated intrahepatic biliary system. There are no discrete filling defects within the opacified portions of the biliary system to suggest the presence of choledocholithiasis. IMPRESSION: No evidence of choledocholithiasis. Electronically Signed   By: Sandi Mariscal M.D.   On: 05/10/2016 13:50   US Abdomen Complete  Result Date: 05/09/2016 CLINICAL DATA:  Severe epigastric pain 6 since 06/30 today. History of hypertension, breast malignancy. EXAM: ABDOMEN ULTRASOUND COMPLETE COMPARISON:  Abdominal and pelvic CT scan dated August 11, 2008 FINDINGS: Gallbladder: The gallbladder is mildly distended. There is wall thickening to 7 mm. There is a small amount of pericholecystic fluid. There is a positive sonographic Murphy's sign. There are multiple mobile gallstones measuring up to 1.6 cm. Common bile duct: Diameter: 7 mm Liver: The liver exhibits normal echotexture. There is mild intrahepatic ductal dilation. There is no focal mass. IVC: No abnormality visualized. Pancreas: The pancreatic duct is visible measuring up to 2 mm in diameter. The pancreatic parenchyma appears normal where visualized. Spleen: Size and appearance within  normal limits. Right Kidney: Length: 9.6 cm. Echogenicity within normal limits. No mass or hydronephrosis visualized. Left Kidney: Length: 9.7 cm. Echogenicity within normal limits. No mass or hydronephrosis visualized. Abdominal aorta: No aneurysm visualized. Other findings: None. IMPRESSION: 1. Gallstones with sonographic evidence of acute cholecystitis. 2. Mild intrahepatic and common bile ductal dilation. No intraluminal stones or pancreatic head mass are observed. 3. No acute abnormality observed elsewhere within the abdomen. 4. These results will be called to the ordering clinician or representative by the Radiologist Assistant, and communication documented in the PACS or zVision Dashboard. Electronically Signed   By: David  Martinique M.D.  On: 05/09/2016 14:52        Scheduled Meds: . calcium carbonate  2 tablet Oral BID WC   And  . cholecalciferol  800 Units Oral BID WC  . [START ON 05/11/2016] enoxaparin (LOVENOX) injection  40 mg Subcutaneous Q24H  . imipramine  20 mg Oral QHS  . letrozole  2.5 mg Oral Daily  . LORazepam  0.5 mg Oral QHS  . pantoprazole  40 mg Oral Daily  . piperacillin-tazobactam (ZOSYN)  IV  3.375 g Intravenous Q8H   Continuous Infusions: . sodium chloride 1,000 mL (05/10/16 1430)     LOS: 1 day    Time spent: 25 minutes    Newman Pies, MD [facility] Pager [phone]  If 7PM-7AM, please contact night-coverage [email or web address] Password [email or web address] 05/10/2016, 3:45 PM

## 2016-05-10 NOTE — Discharge Instructions (Signed)
Laparoscopic Cholecystectomy, Care After °Refer to this sheet in the next few weeks. These instructions provide you with information about caring for yourself after your procedure. Your health care provider may also give you more specific instructions. Your treatment has been planned according to current medical practices, but problems sometimes occur. Call your health care provider if you have any problems or questions after your procedure. °WHAT TO EXPECT AFTER THE PROCEDURE °After your procedure, it is common to have: °· Pain at your incision sites. You will be given pain medicines to control your pain. °· Mild nausea or vomiting. This should improve after the first 24 hours. °· Bloating and possible shoulder pain from the gas that was used during the procedure. This will improve after the first 24 hours. °HOME CARE INSTRUCTIONS °Incision Care °· Follow instructions from your health care provider about how to take care of your incisions. Make sure you: °¨ Wash your hands with soap and water before you change your bandage (dressing). If soap and water are not available, use hand sanitizer. °¨ Change your dressing as told by your health care provider. °¨ Leave stitches (sutures), skin glue, or adhesive strips in place. These skin closures may need to be in place for 2 weeks or longer. If adhesive strip edges start to loosen and curl up, you may trim the loose edges. Do not remove adhesive strips completely unless your health care provider tells you to do that. °· Do not take baths, swim, or use a hot tub until your health care provider approves. Ask your health care provider if you can take showers. You may only be allowed to take sponge baths for bathing. °General Instructions °· Take over-the-counter and prescription medicines only as told by your health care provider. °· Do not drive or operate heavy machinery while taking prescription pain medicine. °· Return to your normal diet as told by your health care  provider. °· Do not lift anything that is heavier than 10 lb (4.5 kg). °· Do not play contact sports for one week or until your health care provider approves. °SEEK MEDICAL CARE IF:  °· You have redness, swelling, or pain at the site of your incision. °· You have fluid, blood, or pus coming from your incision. °· You notice a bad smell coming from your incision area. °· Your surgical incisions break open. °· You have a fever. °SEEK IMMEDIATE MEDICAL CARE IF: °· You develop a rash. °· You have difficulty breathing. °· You have chest pain. °· You have increasing pain in your shoulders (shoulder strap areas). °· You faint or have dizzy episodes while you are standing. °· You have severe pain in your abdomen. °· You have nausea or vomiting that lasts for more than one day. °  °This information is not intended to replace advice given to you by your health care provider. Make sure you discuss any questions you have with your health care provider. °  °Document Released: 08/20/2005 Document Revised: 05/11/2015 Document Reviewed: 04/01/2013 °Elsevier Interactive Patient Education ©2016 [employer] ° °

## 2016-05-10 NOTE — Transfer of Care (Signed)
Immediate Anesthesia Transfer of Care Note  Patient: [name] [name]  Procedure(s) Performed: Procedure(s): LAPAROSCOPIC CHOLECYSTECTOMY WITH INTRAOPERATIVE CHOLANGIOGRAM (N/A)  Patient Location: PACU  Anesthesia Type:General  Level of Consciousness: awake, alert , oriented and patient cooperative  Airway & Oxygen Therapy: Patient Spontanous Breathing and Patient connected to nasal cannula oxygen  Post-op Assessment: Report given to RN, Post -op Vital signs reviewed and stable and Patient moving all extremities X 4  Post vital signs: Reviewed and stable  Last Vitals:  Vitals:   05/10/16 1200 05/10/16 1315  BP: 121/79   Pulse:    Resp: 20   Temp:  36.6 C    Last Pain:  Vitals:   05/10/16 1123  TempSrc: Oral  PainSc: 0-No pain      Patients Stated Pain Goal: 8 ([phone] [id number])  Complications: No apparent anesthesia complications

## 2016-05-10 NOTE — Anesthesia Preprocedure Evaluation (Signed)
Anesthesia Evaluation  Patient identified by MRN, date of birth, ID band Patient awake    Reviewed: Allergy & Precautions, NPO status , Patient's Chart, lab work & pertinent test results  Airway Mallampati: I  TM Distance: >3 FB     Dental  (+) Teeth Intact   Pulmonary neg pulmonary ROS,    breath sounds clear to auscultation       Cardiovascular hypertension, Pt. on medications  Rhythm:Regular Rate:Normal     Neuro/Psych Anxiety    GI/Hepatic GERD  Controlled and Medicated,  Endo/Other    Renal/GU      Musculoskeletal   Abdominal   Peds  Hematology   Anesthesia Other Findings   Reproductive/Obstetrics                             Anesthesia Physical Anesthesia Plan  ASA: II and emergent  Anesthesia Plan: General   Post-op Pain Management:    Induction: Intravenous, Rapid sequence and Cricoid pressure planned  Airway Management Planned: Oral ETT  Additional Equipment:   Intra-op Plan:   Post-operative Plan: Extubation in OR  Informed Consent: I have reviewed the patients History and Physical, chart, labs and discussed the procedure including the risks, benefits and alternatives for the proposed anesthesia with the patient or authorized representative who has indicated his/her understanding and acceptance.     Plan Discussed with:   Anesthesia Plan Comments:         Anesthesia Quick Evaluation

## 2016-05-10 NOTE — Anesthesia Postprocedure Evaluation (Signed)
Anesthesia Post Note  Patient: [name] [name]  Procedure(s) Performed: Procedure(s) (LRB): LAPAROSCOPIC CHOLECYSTECTOMY WITH INTRAOPERATIVE CHOLANGIOGRAM (N/A)  Patient location during evaluation: PACU Anesthesia Type: General Level of consciousness: awake and alert Pain management: pain level controlled Vital Signs Assessment: post-procedure vital signs reviewed and stable Respiratory status: spontaneous breathing, nonlabored ventilation, respiratory function stable and patient connected to nasal cannula oxygen Cardiovascular status: blood pressure returned to baseline and stable Postop Assessment: no signs of nausea or vomiting Anesthetic complications: no    Last Vitals:  Vitals:   05/10/16 1400 05/10/16 1425  BP: 122/80 121/75  Pulse: 77 75  Resp: 14 12  Temp:  36.6 C    Last Pain:  Vitals:   05/10/16 1400  TempSrc:   PainSc: 2                  Jacqualynn Parco

## 2016-05-10 NOTE — Progress Notes (Signed)
utrition Brief Note  Patient identified on the Malnutrition Screening Tool (MST) Report  Patient has family and friends present and in good spirits waiting for her lap chole later this morning. She's expecting to go home this afternoon.   Wt Readings from Last 15 Encounters:  05/10/16 130 lb 11.2 oz (59.3 kg)  04/24/16 132 lb (59.9 kg)  02/07/16 133 lb 12.8 oz (60.7 kg)  08/09/15 135 lb 12.8 oz (61.6 kg)  03/09/15 138 lb (62.6 kg)  01/20/15 138 lb 6.4 oz (62.8 kg)  09/08/14 137 lb 12.8 oz (62.5 kg)  08/02/14 138 lb 3.2 oz (62.7 kg)  08/24/13 135 lb 3.2 oz (61.3 kg)  08/20/13 134 lb 3.2 oz (60.9 kg)  07/28/13 135 lb 9.6 oz (61.5 kg)  11/12/12 131 lb 9.6 oz (59.7 kg)  08/20/12 130 lb 12.8 oz (59.3 kg)  07/02/12 133 lb 4.8 oz (60.5 kg)  09/26/11 140 lb (63.5 kg)    Body mass index is 21.1 kg/m. Patient meets criteria for normal based on current BMI. Minimal weight loss -not significant for timeframe. Usual body weight range 130-135# x 9 months.   Current diet order is NPO for surgery. Labs and medications reviewed.   No nutrition interventions warranted at this time. If nutrition issues arise, please consult RD.   Colman Cater MS,RD,CSG,LDN Office: [phone] Pager: [phone]

## 2016-05-15 ENCOUNTER — Encounter (HOSPITAL_COMMUNITY): Payer: Self-pay | Admitting: General Surgery

## 2016-05-18 DIAGNOSIS — Z853 Personal history of malignant neoplasm of breast: Secondary | ICD-10-CM | POA: Diagnosis not present

## 2016-05-18 DIAGNOSIS — I972 Postmastectomy lymphedema syndrome: Secondary | ICD-10-CM | POA: Diagnosis not present

## 2016-05-28 ENCOUNTER — Telehealth: Payer: Self-pay | Admitting: Gastroenterology

## 2016-05-28 MED ORDER — IMIPRAMINE HCL 25 MG PO TABS: 25.0000 mg | ORAL_TABLET | Freq: Every day | ORAL | 5 refills | Status: DC

## 2016-05-28 MED FILL — LETROZOLE 2.5 MG TABLET: 2.5 | 90 days supply | Qty: 90 | Fill #3

## 2016-05-28 MED FILL — IMIPRAMINE HCL 25 MG TABLET: 25 | 30 days supply | Qty: 30 | Fill #0

## 2016-05-28 NOTE — Telephone Encounter (Signed)
Please let patient know that imipramine comes in 10 mg and 25 mg tablets. We can either drop her down to one 25 mg at bedtime or we can continue three 10 mg tablets at bedtime. Which ever she prefers. Please let me know.

## 2016-05-28 NOTE — Addendum Note (Signed)
Addended by: Mahala Menghini on: 05/28/2016 12:44 PM   Modules accepted: Orders

## 2016-05-28 NOTE — Telephone Encounter (Signed)
Patient called to say that she needed a refill on Imipramine. She said that she started out on 10mg  and would take one one day, 2 the next day and 3 the next day. Her question is can she get a prescription for 30 mg or does she need another for 10 mg. Please advise and call patient back.

## 2016-05-28 NOTE — Telephone Encounter (Signed)
PT would like to try the 25 mg tablets. Please send in Rx. Thanks!

## 2016-05-28 NOTE — Telephone Encounter (Signed)
Pt said Neil Crouch, PA gave her the prescription. She will need a new prescription in the next day or so. She didn't know what strength they come in. Please send in Rx .

## 2016-05-28 NOTE — Telephone Encounter (Signed)
Rx sent 

## 2016-05-28 NOTE — Telephone Encounter (Signed)
Called pt and was on hold for 10 min, will call back later.

## 2016-06-17 ENCOUNTER — Emergency Department (HOSPITAL_COMMUNITY)
Admission: EM | Admit: 2016-06-17 | Discharge: 2016-06-17 | Disposition: A | Discharge status: Home / Self Care | Payer: 59 | Attending: Emergency Medicine | Admitting: Emergency Medicine

## 2016-06-17 ENCOUNTER — Encounter (HOSPITAL_COMMUNITY): Payer: Self-pay | Admitting: *Deleted

## 2016-06-17 DIAGNOSIS — Y9389 Activity, other specified: Secondary | ICD-10-CM | POA: Diagnosis not present

## 2016-06-17 DIAGNOSIS — W182XXA Fall in (into) shower or empty bathtub, initial encounter: Secondary | ICD-10-CM | POA: Insufficient documentation

## 2016-06-17 DIAGNOSIS — I10 Essential (primary) hypertension: Secondary | ICD-10-CM | POA: Insufficient documentation

## 2016-06-17 DIAGNOSIS — Y929 Unspecified place or not applicable: Secondary | ICD-10-CM | POA: Insufficient documentation

## 2016-06-17 DIAGNOSIS — S51811A Laceration without foreign body of right forearm, initial encounter: Secondary | ICD-10-CM | POA: Insufficient documentation

## 2016-06-17 DIAGNOSIS — Y999 Unspecified external cause status: Secondary | ICD-10-CM | POA: Insufficient documentation

## 2016-06-17 DIAGNOSIS — Z79899 Other long term (current) drug therapy: Secondary | ICD-10-CM | POA: Diagnosis not present

## 2016-06-17 DIAGNOSIS — Z853 Personal history of malignant neoplasm of breast: Secondary | ICD-10-CM | POA: Insufficient documentation

## 2016-06-17 MED ORDER — LIDOCAINE HCL (PF) 2 % IJ SOLN
INTRAMUSCULAR | Status: AC
  Administered 2016-06-17: 10 mL
  Filled 2016-06-17: qty 10

## 2016-06-17 NOTE — ED Triage Notes (Signed)
Pt fell in the shower while shaving her legs. She has a laceration to her right forearm. Area is covered at this time with bleeding controlled. Denies any blood thinner use.

## 2016-06-17 NOTE — ED Notes (Signed)
Laceration with bleeding controlled noted to right forearm.

## 2016-06-17 NOTE — ED Provider Notes (Signed)
[facility] Provider Note   CSN: [id number] Arrival date & time: 06/17/16  9165     History   Chief Complaint Chief Complaint  Patient presents with  . Laceration    HPI [name] [name] is a 65 y.o. female.  The history is provided by the patient. No language interpreter was used.  Laceration   The incident occurred less than 1 hour ago. The laceration is located on the right arm. The laceration is 5 cm in size. The laceration mechanism is unknown.The pain is moderate. The pain has been constant since onset. She reports no foreign bodies present. Her tetanus status is UTD.  Pt cut arm on the side of the tub.  Pt complains of a laceration.  Tetanus up to date  Past Medical History:  Diagnosis Date  . Breast cancer (HCC)   . Functional dyspepsia MAR 2014   APR 2014: PT DECLINED BRAVO. PPIs-PROTOIX/PRILOSEC-->DEXILANT-->NEXIUM(TOO EXPENSIVE)  . GERD (gastroesophageal reflux disease)   . High cholesterol   . Hypertension     Patient Active Problem List   Diagnosis Date Noted  . Abnormal LFTs   . Acute calculous cholecystitis 05/09/2016  . Anxiety 05/09/2016  . Hypertension 05/09/2016  . Cholelithiasis 04/24/2016  . Dyspepsia 11/12/2012  . Chest pain 11/12/2012  . Anemia 08/20/2012  . GERD 01/27/2009  . HEMORRHOIDS 01/26/2009  . 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 BREAST LUMPECTOMY  2006   Left  breast  . BREAST LUMPECTOMY  1994   Right breast-benign  . CHOLECYSTECTOMY N/A 05/10/2016   Procedure: LAPAROSCOPIC CHOLECYSTECTOMY WITH INTRAOPERATIVE CHOLANGIOGRAM;  Surgeon: Franky Macho, MD;  Location: [facility] ORS;  Service: General;  Laterality: N/A;  . COLONOSCOPY  2009   normal colon without evidence of polyps  . ESOPHAGOGASTRODUODENOSCOPY  2009   normal stomach,esophagues without mass,reosion  . ESOPHAGOGASTRODUODENOSCOPY  09/17/2011   SLF: Mild gastritis/NO SOURCE FOR IRON  DEFICINECY ANEMIA IDENTIFIED  . ESOPHAGOGASTRODUODENOSCOPY N/A 12/02/2012   SLF: 1. No source for dyspepsia identified 2. MILD Non-erosive gastritis  . GIVENS CAPSULE STUDY  09/26/2011   Procedure: GIVENS CAPSULE STUDY;  Surgeon: Arlyce Harman, MD;  Location: [facility] SUITE;  Service: Endoscopy;  Laterality: N/A;  . ILEOColonoscopy  09/17/2011   SLF: Nl colonoscopy WMO/INTERNA; HEMORRHOIDS/ NO SOURCE FOR IRON DEFICIENCY ANEMA IDENTIFIED    OB History    No data available       Home Medications    Prior to Admission medications   Medication Sig Start Date End Date Taking? Authorizing Provider  atorvastatin (LIPITOR) 40 MG tablet Take 40 mg by mouth at bedtime.    Historical Provider, MD  Calcium Carbonate-Vitamin D (CALCIUM 600+D) 600-400 MG-UNIT tablet Take 2 tablets by mouth 2 (two) times daily.    Historical Provider, MD  clobetasol ointment (TEMOVATE) 0.05 % Apply 1 application topically 2 (two) times daily as needed (for irritation).    Historical Provider, MD  diphenhydrAMINE (BENADRYL) 25 MG tablet Take 50 mg by mouth every 6 (six) hours as needed for allergies.     Historical Provider, MD  docusate sodium (COLACE) 100 MG capsule Take 200 mg by mouth at bedtime as needed for mild constipation.     Historical Provider, MD  enalapril (VASOTEC) 10 MG tablet Take 10 mg by mouth daily.      Historical Provider, MD  esomeprazole (NEXIUM) 40 MG capsule Take 40 mg by mouth daily  before breakfast.    Historical Provider, MD  ferrous sulfate 325 (65 FE) MG tablet Take 325 mg by mouth every other day.    Historical Provider, MD  imipramine (TOFRANIL) 25 MG tablet Take 1 tablet (25 mg total) by mouth at bedtime. 05/28/16   Mahala Menghini, PA-C  letrozole [facility]) 2.5 MG tablet Take 1 tablet (2.5 mg total) by mouth daily. 02/07/16   Patrici Ranks, MD  LORazepam (ATIVAN) 0.5 MG tablet Take 0.5 mg by mouth at bedtime.    Historical Provider, MD  traMADol (ULTRAM) 50 MG tablet Take 1 tablet (50 mg  total) by mouth every 6 (six) hours as needed. 05/10/16   Aviva Signs, MD  valACYclovir (VALTREX) 500 MG tablet Take 500 mg by mouth as needed (for outbreaks).     Historical Provider, MD    Family History Family History  Problem Relation Age of Onset  . Anesthesia problems Neg Hx   . Hypotension Neg Hx   . Malignant hyperthermia Neg Hx   . Pseudochol deficiency Neg Hx     Social History Social History  Substance Use Topics  . Smoking status: Never Smoker  . Smokeless tobacco: Never Used  . Alcohol use No     Allergies   Sulfa antibiotics and Tape   Review of Systems Review of Systems  All other systems reviewed and are negative.    Physical Exam Updated Vital Signs BP 136/76 (BP Location: Right Arm)   Pulse 104   Temp 98.1 F (36.7 C) (Oral)   Resp 16   Ht '5\' 5"'$  (1.651 m)   Wt 59 kg   SpO2 100%   BMI 21.63 kg/m   Physical Exam  Constitutional: She is oriented to person, place, and time. She appears well-developed and well-nourished.  HENT:  Head: Normocephalic.  Eyes: EOM are normal.  Neck: Normal range of motion.  Pulmonary/Chest: Effort normal.  Abdominal: She exhibits no distension.  Musculoskeletal:  7cm laceration right lower arm, gapping.   Neurological: She is alert and oriented to person, place, and time.  Psychiatric: She has a normal mood and affect.  Nursing note and vitals reviewed.    ED Treatments / Results  Labs (all labs ordered are listed, but only abnormal results are displayed) Labs Reviewed - No data to display  EKG  EKG Interpretation None       Radiology No results found.  Procedures .Marland KitchenLaceration Repair Date/Time: 06/17/2016 11:11 AM Performed by: Fransico Meadow Authorized by: Fransico Meadow   Consent:    Consent obtained:  Verbal Anesthesia (see MAR for exact dosages):    Anesthesia method:  None Laceration details:    Length (cm):  7 Repair type:    Repair type:  Simple Pre-procedure details:     Preparation:  Patient was prepped and draped in usual sterile fashion Exploration:    Contaminated: no   Treatment:    Area cleansed with:  Betadine   Amount of cleaning:  Standard   Irrigation solution:  Sterile saline   Visualized foreign bodies/material removed: no   Skin repair:    Repair method:  Sutures   Suture size:  5-0   Suture material:  Prolene   Number of sutures:  8 Approximation:    Approximation:  Close   Vermilion border: well-aligned   Post-procedure details:    Dressing:  Non-adherent dressing   Patient tolerance of procedure:  Tolerated well, no immediate complications   (including critical care time)  Medications Ordered in ED Medications  lidocaine (XYLOCAINE) 2 % injection (10 mLs  Given by Other 06/17/16 1006)     Initial Impression / Assessment and Plan / ED Course  I have reviewed the triage vital signs and the nursing notes.  Pertinent labs & imaging results that were available during my care of the patient were reviewed by me and considered in my medical decision making (see chart for details).  Clinical Course      Final Clinical Impressions(s) / ED Diagnoses   Final diagnoses:  Laceration of right forearm, initial encounter    New Prescriptions Discharge Medication List as of 06/17/2016 10:21 AM    An After Visit Summary was printed and given to the patient.   Hollace Kinnier Danielson, PA-C 06/17/16 [place], MD 06/20/16 [phone]

## 2016-06-17 NOTE — ED Notes (Signed)
8 sutures noted to right forearm. Clean dry dressing applied.

## 2016-06-19 DIAGNOSIS — N952 Postmenopausal atrophic vaginitis: Secondary | ICD-10-CM | POA: Diagnosis not present

## 2016-06-19 DIAGNOSIS — A609 Anogenital herpesviral infection, unspecified: Secondary | ICD-10-CM | POA: Diagnosis not present

## 2016-06-19 DIAGNOSIS — Z1389 Encounter for screening for other disorder: Secondary | ICD-10-CM | POA: Diagnosis not present

## 2016-06-19 DIAGNOSIS — Z6820 Body mass index (BMI) 20.0-20.9, adult: Secondary | ICD-10-CM | POA: Diagnosis not present

## 2016-06-19 DIAGNOSIS — Z01419 Encounter for gynecological examination (general) (routine) without abnormal findings: Secondary | ICD-10-CM | POA: Diagnosis not present

## 2016-06-21 MED FILL — LORazepam 0.5 MG TABS: 0.5 | 30 days supply | Qty: 90 | Fill #1

## 2016-06-21 MED FILL — ATORVASTATIN 40 MG TABLET: 40 | 90 days supply | Qty: 90 | Fill #1

## 2016-06-21 MED FILL — IMIPRAMINE HCL 25 MG TABLET: 25 | 90 days supply | Qty: 90 | Fill #0

## 2016-06-25 DIAGNOSIS — K8010 Calculus of gallbladder with chronic cholecystitis without obstruction: Secondary | ICD-10-CM | POA: Diagnosis not present

## 2016-06-25 DIAGNOSIS — M65311 Trigger thumb, right thumb: Secondary | ICD-10-CM | POA: Diagnosis not present

## 2016-06-25 DIAGNOSIS — K2970 Gastritis, unspecified, without bleeding: Secondary | ICD-10-CM | POA: Diagnosis not present

## 2016-06-25 DIAGNOSIS — S51811D Laceration without foreign body of right forearm, subsequent encounter: Secondary | ICD-10-CM | POA: Diagnosis not present

## 2016-06-29 DIAGNOSIS — S41111S Laceration without foreign body of right upper arm, sequela: Secondary | ICD-10-CM | POA: Diagnosis not present

## 2016-07-16 MED FILL — ENALAPRIL MALEATE 10 MG TAB: 10 | 90 days supply | Qty: 90 | Fill #0

## 2016-07-16 MED FILL — ESOMEPRAZOLE MAG DR 40 MG C: 40 | 90 days supply | Qty: 90 | Fill #0

## 2016-08-08 ENCOUNTER — Ambulatory Visit (HOSPITAL_COMMUNITY): Payer: 59 | Admitting: Hematology & Oncology

## 2016-08-09 ENCOUNTER — Ambulatory Visit (HOSPITAL_COMMUNITY): Payer: 59 | Admitting: Hematology & Oncology

## 2016-08-13 ENCOUNTER — Ambulatory Visit (HOSPITAL_COMMUNITY): Payer: 59 | Admitting: Hematology & Oncology

## 2016-08-13 ENCOUNTER — Other Ambulatory Visit (HOSPITAL_COMMUNITY): Payer: Self-pay | Admitting: Oncology

## 2016-08-13 DIAGNOSIS — R52 Pain, unspecified: Secondary | ICD-10-CM

## 2016-08-13 MED ORDER — LETROZOLE 2.5 MG PO TABS: 2.5000 mg | ORAL_TABLET | Freq: Every day | ORAL | 1 refills | Status: DC

## 2016-08-14 ENCOUNTER — Ambulatory Visit: Payer: 59 | Admitting: Orthopedic Surgery

## 2016-08-20 MED FILL — LETROZOLE 2.5 MG TABLET: 2.5 | 90 days supply | Qty: 90 | Fill #0

## 2016-08-20 MED FILL — VALACYCLOVIR HCL 500 MG TAB: 500 | 30 days supply | Qty: 30 | Fill #0

## 2016-08-31 NOTE — Progress Notes (Signed)
Cancelled/Rescheduled  This encounter was created in error - please disregard.

## 2016-09-17 MED FILL — IMIPRAMINE HCL 25 MG TABLET: 25 | 90 days supply | Qty: 90 | Fill #1

## 2016-09-17 MED FILL — LORazepam 0.5 MG TABS: 0.5 | 30 days supply | Qty: 90 | Fill #2

## 2016-09-17 MED FILL — ATORVASTATIN 40 MG TABLET: 40 | 90 days supply | Qty: 90 | Fill #2

## 2016-09-18 ENCOUNTER — Encounter (HOSPITAL_COMMUNITY): Payer: Self-pay | Admitting: Hematology & Oncology

## 2016-09-18 ENCOUNTER — Encounter (HOSPITAL_COMMUNITY): Payer: 59 | Attending: Hematology & Oncology | Admitting: Hematology & Oncology

## 2016-09-18 DIAGNOSIS — Z170 Estrogen receptor positive status [ER+]: Secondary | ICD-10-CM

## 2016-09-18 DIAGNOSIS — M8580 Other specified disorders of bone density and structure, unspecified site: Secondary | ICD-10-CM

## 2016-09-18 DIAGNOSIS — I890 Lymphedema, not elsewhere classified: Secondary | ICD-10-CM

## 2016-09-18 DIAGNOSIS — C50911 Malignant neoplasm of unspecified site of right female breast: Secondary | ICD-10-CM | POA: Diagnosis not present

## 2016-09-18 DIAGNOSIS — Z853 Personal history of malignant neoplasm of breast: Secondary | ICD-10-CM

## 2016-09-18 MED ORDER — LETROZOLE 2.5 MG PO TABS: 2.5000 mg | ORAL_TABLET | Freq: Every day | ORAL | 3 refills | Status: DC

## 2016-09-18 NOTE — Progress Notes (Signed)
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 Ki-67 marker was high at 52% Did not tolerate letrozole, completed 5 years of tamoxifen in May 2012 Opted to retry letrozole for extended therapy which she started in November 2014 R breast biopsy on 6/29/2/016 with fibrocystic changes, no evidence of malignancy  CURRENT THERAPY: surveillance   INTERVAL HISTORY: [name] [name] 66 y.o. female returns for follow up of a previously diagnosed breast cancer.  She schedules her own mammograms. Will schedule it at [facility].   [name] [name] is unaccompanied. I personally reviewed and went over bone density results with the patient.  States hair thinning which she attributes to her letrozole.  She continues to wear a lymphedema sleeve on her left arm. She requests a new prescription for a lymphedema sleeve. She says her old one is so thick and too tight. She can only wear it for half a day and has to take it off because it is hurting and her arm is red. She thinks it is cutting the circulation off her arm. She would like a 20-30 mm hg size.   She states she  has lost some weight, that she attributes to her gall bladder surgery and she was sick. She also doesn't eat as much as she used to. She doesn't eat as much cheese, chocolate, or fried foods as before.   She will go to her PCP in 2 weeks and she will get her lab work done then.   She states she had stomach pain and issues that are better now.   States she continues to walk and stay active.   States she is up to date on her mammograms and well care. She still regularly sees her gynecologist. States she does self breast exams.    MEDICAL HISTORY: Past Medical History:  Diagnosis Date  . Breast cancer ([name] [name])   . Functional  dyspepsia MAR 2014   APR 2014: PT DECLINED BRAVO. PPIs-PROTOIX/PRILOSEC-->DEXILANT-->NEXIUM(TOO EXPENSIVE)  . GERD (gastroesophageal reflux disease)   . High cholesterol   . Hypertension     has Hx Breast cancer, IDC, Left, Stage II, receptor+, Her2 -; HEMORRHOIDS; GERD; Anemia; Dyspepsia; Chest pain; Cholelithiasis; Acute calculous cholecystitis; Anxiety; Hypertension; and Abnormal LFTs on her problem list.     is allergic to sulfa antibiotics and tape.  Current Outpatient Prescriptions on File Prior to Visit  Medication Sig Dispense Refill  . atorvastatin (LIPITOR) 40 MG tablet Take 40 mg by mouth at bedtime.    . Calcium Carbonate-Vitamin D (CALCIUM 600+D) 600-400 MG-UNIT tablet Take 2 tablets by mouth 2 (two) times daily.    . clobetasol ointment (TEMOVATE) 3.66 % Apply 1 application topically 2 (two) times daily as needed (for irritation).    . diphenhydrAMINE (BENADRYL) 25 MG tablet Take 50 mg by mouth every 6 (six) hours as needed for allergies.     Marland Kitchen docusate sodium (COLACE) 100 MG capsule Take 200 mg by mouth at bedtime as needed for mild constipation.     . enalapril (VASOTEC) 10 MG tablet Take 10 mg by mouth daily.      Marland Kitchen esomeprazole (NEXIUM) 40 MG capsule Take 40 mg by mouth daily before breakfast.    . ferrous sulfate 325 (65 FE)  MG tablet Take 325 mg by mouth every other day.    . imipramine (TOFRANIL) 25 MG tablet Take 1 tablet (25 mg total) by mouth at bedtime. (Patient taking differently: Take 25 mg by mouth daily. ) 30 tablet 5  . LORazepam (ATIVAN) 0.5 MG tablet Take 0.5 mg by mouth at bedtime.    . valACYclovir (VALTREX) 500 MG tablet Take 500 mg by mouth as needed (for outbreaks).   12   No current facility-administered medications on file prior to visit.      SURGICAL HISTORY: Past Surgical History:  Procedure Laterality Date  . BREAST LUMPECTOMY  2006   Left  breast  . BREAST LUMPECTOMY  1994   Right breast-benign  . CHOLECYSTECTOMY N/A 05/10/2016    Procedure: LAPAROSCOPIC CHOLECYSTECTOMY WITH INTRAOPERATIVE CHOLANGIOGRAM;  Surgeon: Aviva Signs, MD;  Location: [facility];  Service: General;  Laterality: N/A;  . COLONOSCOPY  2009   normal colon without evidence of polyps  . ESOPHAGOGASTRODUODENOSCOPY  2009   normal stomach,esophagues without mass,reosion  . ESOPHAGOGASTRODUODENOSCOPY  09/17/2011   SLF: Mild gastritis/NO SOURCE FOR IRON DEFICINECY ANEMIA IDENTIFIED  . ESOPHAGOGASTRODUODENOSCOPY N/A 12/02/2012   SLF: 1. No source for dyspepsia identified 2. MILD Non-erosive gastritis  . GIVENS CAPSULE STUDY  09/26/2011   Procedure: GIVENS CAPSULE STUDY;  Surgeon: Dorothyann Peng, MD;  Location: [facility] SUITE;  Service: Endoscopy;  Laterality: N/A;  . ILEOColonoscopy  09/17/2011   SLF: Nl colonoscopy WMO/INTERNA; HEMORRHOIDS/ NO SOURCE FOR IRON DEFICIENCY ANEMA IDENTIFIED    SOCIAL HISTORY: Social History   Social History  . Marital status: Married    Spouse name: N/A  . Number of children: N/A  . Years of education: N/A   Occupational History  . Not on file.   Social History Main Topics  . Smoking status: Never Smoker  . Smokeless tobacco: Never Used  . Alcohol use No  . Drug use: No  . Sexual activity: Not on file   Other Topics Concern  . Not on file   Social History Narrative  . No narrative on file    FAMILY HISTORY: Family History  Problem Relation Age of Onset  . Anesthesia problems Neg Hx   . Hypotension Neg Hx   . Malignant hyperthermia Neg Hx   . Pseudochol deficiency Neg Hx     Review of Systems  Constitutional: Positive for weight loss (attributes to gall bladder surgery and eating less and healthier.). Negative for chills, diaphoresis, fever and malaise/fatigue.  HENT: Negative.  Negative for ear pain, hearing loss and tinnitus.   Eyes: Negative.  Negative for blurred vision, double vision, pain, discharge and redness.  Respiratory: Negative.  Negative for cough, hemoptysis, sputum production, shortness of  breath and wheezing.   Cardiovascular: Negative.  Negative for chest pain, palpitations, orthopnea, leg swelling and PND.       Breasts sore and hard all the time  Gastrointestinal: Positive for abdominal pain (better now). Negative for blood in stool, constipation, diarrhea, melena, nausea and vomiting.  Genitourinary: Negative.  Negative for dysuria and hematuria.  Musculoskeletal: Negative.  Negative for myalgias.  Skin: Negative.  Negative for rash.       Hair thinning attributes to lisinopril  Neurological: Negative.  Negative for dizziness, tingling, weakness and headaches.  Endo/Heme/Allergies: Negative.  Negative for environmental allergies. Does not bruise/bleed easily.  Psychiatric/Behavioral: Negative.  Negative for depression, hallucinations, substance abuse and suicidal ideas. The patient is not nervous/anxious.   All other systems reviewed and are  negative. 14 point review of systems was performed and is negative except as detailed under history of present illness and above   PHYSICAL EXAMINATION  ECOG PERFORMANCE STATUS: 0 - Asymptomatic  Vitals:   09/18/16 1033  BP: 129/84  Pulse: (!) 102  Resp: 18  Temp: 98 F (36.7 C)    Physical Exam  Constitutional: She is oriented to person, place, and time and well-developed, well-nourished, and in no distress.  HENT:  Head: Normocephalic and atraumatic.  Nose: Nose normal.  Mouth/Throat: Oropharynx is clear and moist. No oropharyngeal exudate.  Eyes: Conjunctivae and EOM are normal. Pupils are equal, round, and reactive to light. Right eye exhibits no discharge. Left eye exhibits no discharge. No scleral icterus.  Neck: Normal range of motion. Neck supple. No tracheal deviation present. No thyromegaly present.  Cardiovascular: Normal rate, regular rhythm and normal heart sounds.  Exam reveals no gallop and no friction rub.   No murmur heard. Pulmonary/Chest: Effort normal and breath sounds normal. She has no wheezes. She  has no rales.    Abdominal: Soft. Bowel sounds are normal. She exhibits no distension and no mass. There is no tenderness. There is no rebound and no guarding.  Musculoskeletal: Normal range of motion. She exhibits no edema.  Lymphadenopathy:    She has no cervical adenopathy.  Neurological: She is alert and oriented to person, place, and time. She has normal reflexes. No cranial nerve deficit. Gait normal. Coordination normal.  Skin: Skin is warm and dry. No rash noted.  Psychiatric: Mood, memory, affect and judgment normal.  Nursing note and vitals reviewed.   LABORATORY DATA: I have reviewed the data as listed. CBC    Component Value Date/Time   WBC 6.5 05/10/2016 0558   RBC 4.36 05/10/2016 0558   HGB 13.5 05/10/2016 0558   HGB 13.5 06/19/2006 1454   HCT 39.9 05/10/2016 0558   HCT 39.1 06/19/2006 1454   PLT 138 (L) 05/10/2016 0558   PLT 193 06/19/2006 1454   MCV 91.5 05/10/2016 0558   MCV 91.8 06/19/2006 1454   MCH 31.0 05/10/2016 0558   MCHC 33.8 05/10/2016 0558   RDW 13.7 05/10/2016 0558   RDW 12.9 06/19/2006 1454   LYMPHSABS 1.1 05/10/2016 0558   LYMPHSABS 1.0 06/19/2006 1454   MONOABS 0.5 05/10/2016 0558   MONOABS 0.3 06/19/2006 1454   EOSABS 0.0 05/10/2016 0558   EOSABS 0.0 06/19/2006 1454   BASOSABS 0.0 05/10/2016 0558   BASOSABS 0.0 06/19/2006 1454   CMP     Component Value Date/Time   NA 139 05/10/2016 0558   K 4.0 05/10/2016 0558   CL 107 05/10/2016 0558   CO2 22 05/10/2016 0558   GLUCOSE 79 05/10/2016 0558   BUN 13 05/10/2016 0558   CREATININE 0.65 05/10/2016 0558   CALCIUM 9.3 05/10/2016 0558   PROT 7.3 05/10/2016 0558   ALBUMIN 4.2 05/10/2016 0558   AST 666 (H) 05/10/2016 0558   ALT 496 (H) 05/10/2016 0558   ALKPHOS 110 05/10/2016 0558   BILITOT 3.3 (H) 05/10/2016 0558   GFRNONAA >60 05/10/2016 0558   GFRAA >60 05/10/2016 8676   PATHOLOGY:      RADIOGRAPHIC STUDIES: I have personally reviewed the radiological images as listed and  agreed with the findings in the report.  Study Result    CLINICAL DATA:  Intraoperative cholangiogram during laparoscopic cholecystectomy.  EXAM: INTRAOPERATIVE CHOLANGIOGRAM  FLUOROSCOPY TIME:  24 seconds  COMPARISON:  Right upper quadrant abdominal ultrasound - 05/09/2016  FINDINGS: Intraoperative  cholangiographic images of the right upper abdominal quadrant during laparoscopic cholecystectomy are provided for review.  Surgical clips overlie the expected location of the gallbladder fossa.  Contrast injection demonstrates selective cannulation of the central aspect of the cystic duct.  There is passage of contrast through the central aspect of the cystic duct with filling of a non dilated common bile duct. There is passage of contrast though the CBD and into the descending portion of the duodenum.  There is minimal reflux of injected contrast into the common hepatic duct and central aspect of the non dilated intrahepatic biliary system.  There are no discrete filling defects within the opacified portions of the biliary system to suggest the presence of choledocholithiasis.  IMPRESSION: No evidence of choledocholithiasis.   Electronically Signed   By: Sandi Mariscal M.D.   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 Completed 5 years tamoxifen Started letrozole in November 2014 Minimal left upper extremity lymphedema. Osteopenia High risk medication,    Labs reviewed. Results noted above. LFT's were elevated in September but these were obtained at the time of admission for her cholecystitis.   I performed a breast exam on her today. Results are noted above in  the physical exam.   I wrote her a new prescription for her lymphedema sleeve. Her old one was too tight and thick and  caused her pain.   I refilled her letrozole. She has osteopenia. She is to continue on calcium plus D and with physical activity. We have discussed bone directed therapy in the past but she is currently not interested.   I will space out her visits yearly. She will follow up in 1 year.     All questions were answered. The patient knows to call the clinic with any problems, questions or concerns. We can certainly see the patient much sooner if necessary.  This note was electronically signed.  This document serves as a record of services personally performed by Ancil Linsey, MD. It was created on her behalf by Shirlean Mylar, a trained medical scribe. The creation of this record is based on the scribe's personal observations and the provider's statements to them. This document has been checked and approved by the attending provider.   I have reviewed the above documentation for accuracy and completeness, and I agree with the above.  Kelby Fam. Penland MD

## 2016-09-18 NOTE — Patient Instructions (Signed)
[place] at [facility] Discharge Instructions  RECOMMENDATIONS MADE BY THE CONSULTANT AND ANY TEST RESULTS WILL BE SENT TO YOUR REFERRING PHYSICIAN.  You were seen today by Dr. [name] Prescription for Femara sent to pharmacy You have been given a prescription for left arm sleeve Follow up in 1 year  Thank you for choosing [place] at [facility] to provide your oncology and hematology care.  To afford each patient quality time with our provider, please arrive at least 15 minutes before your scheduled appointment time.    If you have a lab appointment with the [facility] please come in thru the  Main Entrance and check in at the main information desk  You need to re-schedule your appointment should you arrive 10 or more minutes late.  We strive to give you quality time with our providers, and arriving late affects you and other patients whose appointments are after yours.  Also, if you no show three or more times for appointments you may be dismissed from the clinic at the providers discretion.     Again, thank you for choosing [facility].  Our hope is that these requests will decrease the amount of time that you wait before being seen by our physicians.       _____________________________________________________________  Should you have questions after your visit to [facility], please contact our office at [phone] between the hours of 8:30 a.m. and 4:30 p.m.  Voicemails left after 4:30 p.m. will not be returned until the following business day.  For prescription refill requests, have your pharmacy contact our office.       Resources For Cancer Patients and their Caregivers ? American Cancer Society: Can assist with transportation, wigs, general needs, runs Look Good Feel Better.        539-752-4309 ? Cancer Care: Provides financial assistance, online support groups, medication/co-pay assistance.   1-800-813-HOPE 469-777-4605) ? [place] Assists [place] cancer patients and their families through emotional , educational and financial support.  (925)175-6223 ? [place] DSS Where to apply for food stamps, Medicaid and utility assistance. 931-015-3168 ? RCATS: Transportation to medical appointments. 684-774-4862 ? Social Security Administration: May apply for disability if have a Stage IV cancer. 201-105-1772 224-037-8983 ? [employer], Disability and Transit Services: Assists with nutrition, care and transit needs. [place] Support Programs: @10RELATIVEDAYS @ > Cancer Support Group  2nd Tuesday of the month 1pm-2pm, Journey Room  > Creative Journey  3rd Tuesday of the month 1130am-1pm, Journey Room  > Look Good Feel Better  1st Wednesday of the month 10am-12 noon, Journey Room (Call [place] to register [phone])

## 2016-09-25 ENCOUNTER — Other Ambulatory Visit (HOSPITAL_COMMUNITY)
Admission: RE | Admit: 2016-09-25 | Discharge: 2016-09-25 | Disposition: A | Discharge status: Home / Self Care | Payer: 59 | Source: Ambulatory Visit | Attending: Family Medicine | Admitting: Family Medicine

## 2016-09-25 DIAGNOSIS — D509 Iron deficiency anemia, unspecified: Secondary | ICD-10-CM | POA: Insufficient documentation

## 2016-09-25 DIAGNOSIS — E7800 Pure hypercholesterolemia, unspecified: Secondary | ICD-10-CM | POA: Insufficient documentation

## 2016-09-25 DIAGNOSIS — K2970 Gastritis, unspecified, without bleeding: Secondary | ICD-10-CM | POA: Insufficient documentation

## 2016-09-25 DIAGNOSIS — I10 Essential (primary) hypertension: Secondary | ICD-10-CM | POA: Insufficient documentation

## 2016-09-25 DIAGNOSIS — Z8711 Personal history of peptic ulcer disease: Secondary | ICD-10-CM | POA: Insufficient documentation

## 2016-09-25 DIAGNOSIS — E538 Deficiency of other specified B group vitamins: Secondary | ICD-10-CM | POA: Insufficient documentation

## 2016-09-25 DIAGNOSIS — R5383 Other fatigue: Secondary | ICD-10-CM | POA: Insufficient documentation

## 2016-09-25 LAB — CBC WITH DIFFERENTIAL/PLATELET
Basophils Absolute: 0 10*3/uL (ref 0.0–0.1)
Basophils Relative: 1 %
Eosinophils Absolute: 0 10*3/uL (ref 0.0–0.7)
Eosinophils Relative: 1 %
HEMATOCRIT: 41.7 % (ref 36.0–46.0)
HEMOGLOBIN: 14.1 g/dL (ref 12.0–15.0)
LYMPHS ABS: 1.6 10*3/uL (ref 0.7–4.0)
Lymphocytes Relative: 37 %
MCH: 30.7 pg (ref 26.0–34.0)
MCHC: 33.8 g/dL (ref 30.0–36.0)
MCV: 90.7 fL (ref 78.0–100.0)
MONO ABS: 0.3 10*3/uL (ref 0.1–1.0)
MONOS PCT: 7 %
NEUTROS ABS: 2.3 10*3/uL (ref 1.7–7.7)
NEUTROS PCT: 54 %
Platelets: 181 10*3/uL (ref 150–400)
RBC: 4.6 MIL/uL (ref 3.87–5.11)
RDW: 12.9 % (ref 11.5–15.5)
WBC: 4.2 10*3/uL (ref 4.0–10.5)

## 2016-09-25 LAB — COMPREHENSIVE METABOLIC PANEL
ALT: 20 U/L (ref 14–54)
AST: 28 U/L (ref 15–41)
Albumin: 4.5 g/dL (ref 3.5–5.0)
Alkaline Phosphatase: 56 U/L (ref 38–126)
Anion gap: 8 (ref 5–15)
BILIRUBIN TOTAL: 0.9 mg/dL (ref 0.3–1.2)
BUN: 11 mg/dL (ref 6–20)
CO2: 29 mmol/L (ref 22–32)
CREATININE: 0.67 mg/dL (ref 0.44–1.00)
Calcium: 9.7 mg/dL (ref 8.9–10.3)
Chloride: 101 mmol/L (ref 101–111)
Glucose, Bld: 111 mg/dL — ABNORMAL HIGH (ref 65–99)
POTASSIUM: 4.4 mmol/L (ref 3.5–5.1)
Sodium: 138 mmol/L (ref 135–145)
TOTAL PROTEIN: 7.9 g/dL (ref 6.5–8.1)

## 2016-09-25 LAB — LIPID PANEL
CHOLESTEROL: 133 mg/dL (ref 0–200)
HDL: 62 mg/dL (ref >40)
LDL Cholesterol: 57 mg/dL (ref 0–99)
TRIGLYCERIDES: 71 mg/dL (ref <150)
Total CHOL/HDL Ratio: 2.1 RATIO
VLDL: 14 mg/dL (ref 0–40)

## 2016-09-25 LAB — VITAMIN B12: Vitamin B-12: 501 pg/mL (ref 180–914)

## 2016-09-25 LAB — TSH: TSH: 1.481 u[IU]/mL (ref 0.350–4.500)

## 2016-10-01 DIAGNOSIS — C50912 Malignant neoplasm of unspecified site of left female breast: Secondary | ICD-10-CM | POA: Diagnosis not present

## 2016-10-01 DIAGNOSIS — I10 Essential (primary) hypertension: Secondary | ICD-10-CM | POA: Diagnosis not present

## 2016-10-01 DIAGNOSIS — K210 Gastro-esophageal reflux disease with esophagitis: Secondary | ICD-10-CM | POA: Diagnosis not present

## 2016-10-01 DIAGNOSIS — Z853 Personal history of malignant neoplasm of breast: Secondary | ICD-10-CM | POA: Diagnosis not present

## 2016-10-04 ENCOUNTER — Encounter (HOSPITAL_COMMUNITY): Payer: Self-pay | Admitting: Hematology & Oncology

## 2016-10-15 MED FILL — VALACYCLOVIR HCL 500 MG TAB: 500 | 30 days supply | Qty: 30 | Fill #1

## 2016-10-15 MED FILL — ENALAPRIL MALEATE 10 MG TAB: 10 | 90 days supply | Qty: 90 | Fill #1

## 2016-10-15 MED FILL — ESOMEPRAZOLE MAG DR 40 MG C: 40 | 90 days supply | Qty: 90 | Fill #1

## 2016-11-14 MED FILL — LETROZOLE 2.5 MG TABLET: 2.5 | 90 days supply | Qty: 90 | Fill #1

## 2016-12-06 ENCOUNTER — Encounter (HOSPITAL_COMMUNITY): Payer: 59 | Attending: Hematology & Oncology | Admitting: Adult Health

## 2016-12-06 ENCOUNTER — Ambulatory Visit (HOSPITAL_COMMUNITY)
Admission: RE | Admit: 2016-12-06 | Discharge: 2016-12-06 | Disposition: A | Discharge status: Home / Self Care | Payer: 59 | Source: Ambulatory Visit | Attending: Adult Health | Admitting: Adult Health

## 2016-12-06 ENCOUNTER — Telehealth (HOSPITAL_COMMUNITY): Payer: Self-pay

## 2016-12-06 ENCOUNTER — Other Ambulatory Visit (HOSPITAL_COMMUNITY): Payer: Self-pay | Admitting: Adult Health

## 2016-12-06 ENCOUNTER — Encounter (HOSPITAL_COMMUNITY): Payer: Self-pay | Admitting: Adult Health

## 2016-12-06 VITALS — BP 131/73 | HR 107 | Temp 99.3°F | Resp 16

## 2016-12-06 DIAGNOSIS — R600 Localized edema: Secondary | ICD-10-CM

## 2016-12-06 DIAGNOSIS — L539 Erythematous condition, unspecified: Secondary | ICD-10-CM

## 2016-12-06 DIAGNOSIS — Z853 Personal history of malignant neoplasm of breast: Secondary | ICD-10-CM | POA: Diagnosis not present

## 2016-12-06 DIAGNOSIS — C50919 Malignant neoplasm of unspecified site of unspecified female breast: Secondary | ICD-10-CM | POA: Diagnosis not present

## 2016-12-06 DIAGNOSIS — L0390 Cellulitis, unspecified: Secondary | ICD-10-CM

## 2016-12-06 DIAGNOSIS — R509 Fever, unspecified: Secondary | ICD-10-CM

## 2016-12-06 DIAGNOSIS — M79602 Pain in left arm: Secondary | ICD-10-CM | POA: Diagnosis not present

## 2016-12-06 DIAGNOSIS — M7989 Other specified soft tissue disorders: Secondary | ICD-10-CM | POA: Diagnosis not present

## 2016-12-06 DIAGNOSIS — A46 Erysipelas: Secondary | ICD-10-CM

## 2016-12-06 MED ORDER — CLINDAMYCIN HCL 300 MG PO CAPS: 300.0000 mg | ORAL_CAPSULE | Freq: Three times a day (TID) | ORAL | 0 refills | Status: DC

## 2016-12-06 NOTE — Patient Instructions (Signed)
[place] at [facility] Discharge Instructions  RECOMMENDATIONS MADE BY THE CONSULTANT AND ANY TEST RESULTS WILL BE SENT TO YOUR REFERRING PHYSICIAN.  -You saw Mike Craze, NP, today -She thinks you have cellulitis. -Start antibiotic today-[facility] to [facility]. Finish taking all the antibiotic. -Call us if symptoms do not get better or worsen. If after hours and weekends go to ER -Sometimes antibiotics can cause a yeast infection. Try OTC products first such as monistat. If they do not work, let us know. -Keep scheduled appointments.  Thank you for choosing [place] at [facility] to provide your oncology and hematology care.  To afford each patient quality time with our provider, please arrive at least 15 minutes before your scheduled appointment time.    If you have a lab appointment with the [facility] please come in thru the  Main Entrance and check in at the main information desk  You need to re-schedule your appointment should you arrive 10 or more minutes late.  We strive to give you quality time with our providers, and arriving late affects you and other patients whose appointments are after yours.  Also, if you no show three or more times for appointments you may be dismissed from the clinic at the providers discretion.     Again, thank you for choosing [facility].  Our hope is that these requests will decrease the amount of time that you wait before being seen by our physicians.       _____________________________________________________________  Should you have questions after your visit to [facility], please contact our office at [phone] between the hours of 8:30 a.m. and 4:30 p.m.  Voicemails left after 4:30 p.m. will not be returned until the following business day.  For prescription refill requests, have your pharmacy contact our office.       Resources For Cancer Patients and their  Caregivers ? American Cancer Society: Can assist with transportation, wigs, general needs, runs Look Good Feel Better.        580-663-0719 ? Cancer Care: Provides financial assistance, online support groups, medication/co-pay assistance.  1-800-813-HOPE 9547291067) ? [place] Assists [place] cancer patients and their families through emotional , educational and financial support.  (302)127-6157 ? [place] DSS Where to apply for food stamps, Medicaid and utility assistance. (416) 321-2860 ? RCATS: Transportation to medical appointments. 6187252242 ? Social Security Administration: May apply for disability if have a Stage IV cancer. (816)186-4931 763-565-2662 ? [employer], Disability and Transit Services: Assists with nutrition, care and transit needs. [place] Support Programs: @10RELATIVEDAYS @ > Cancer Support Group  2nd Tuesday of the month 1pm-2pm, Journey Room  > Creative Journey  3rd Tuesday of the month 1130am-1pm, Journey Room  > Look Good Feel Better  1st Wednesday of the month 10am-12 noon, Journey Room (Call [place] to register [phone])

## 2016-12-06 NOTE — Telephone Encounter (Signed)
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 She states she wears a lymphedema sleeve but took it off this am because it seemed to be making the swelling and redness worse. She states she has had the redness and swelling in her breast before but not her arm. Reviewed with RN and NP. Order for [place] of left upper extremity entered to check DVT. 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 Once we we get the results The NP will review and go from there. Patient verbalized understanding of the above plan.

## 2016-12-06 NOTE — Progress Notes (Signed)
[name] [name], [name] [name] [id number]   CLINIC:  Medical Oncology/Hematology  PCP:  [name], MD [place] [address] [phone]   REASON FOR VISIT:  Unscheduled visit for left arm erythema/warmth/edema; history of left breast cancer    INTERVAL HISTORY:  [name] presents for an urgent work-in visit for new, left upper extremity and left breast edema, erythema, and warmth.  Symptoms started this morning and are progressively worsening.  Reports history of chronic (L) arm lymphedema since sentinel lymph node biopsy surgery done with left breast lumpectomy in 2006 for h/o breast cancer.  She wears her lymphedema sleeve regularly; states that she tried to wear the sleeve this morning, but thinks it may have made her symptoms worse.  Reports that "my breast is never pale and always has a little bit of redness on the skin, but this is worse."  Denies any recent abrasion, wound, or known insect bite to left arm/left breast. States that she had similar episode several months ago to her left breast only; it was felt to be lymphedema and she did not require antibiotics. She feels that this episode is worse since it involves her left arm as well.   Denies fever. 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    We sent patient down to radiology today to have stat ultrasound of her LUE to r/o DVT.     REVIEW OF SYSTEMS:  Review of Systems  Constitutional: Positive for chills. Negative for fever.  Respiratory: Negative.   Cardiovascular: Negative.   Skin:       Erythema/warmth/edema left upper arm and left breast      PAST MEDICAL/SURGICAL HISTORY:  Past Medical History:  Diagnosis Date  . Breast cancer ([place])   . Functional dyspepsia MAR 2014   APR 2014: PT DECLINED BRAVO. PPIs-PROTOIX/PRILOSEC-->DEXILANT-->NEXIUM(TOO EXPENSIVE)  . GERD  (gastroesophageal reflux disease)   . High cholesterol   . Hypertension    Past Surgical History:  Procedure Laterality Date  . BREAST LUMPECTOMY  2006   Left  breast  . BREAST LUMPECTOMY  1994   Right breast-benign  . CHOLECYSTECTOMY N/A 05/10/2016   Procedure: LAPAROSCOPIC CHOLECYSTECTOMY WITH INTRAOPERATIVE CHOLANGIOGRAM;  Surgeon: Aviva Signs, MD;  Location: [facility] OR[facility];  Service: General;  Laterality: N/A;  . COLONOSCOPY  2009   normal colon without evidence of polyps  . ESOPHAGOGASTRODUODENOSCOPY  2009   normal stomach,esophagues without mass,reosion  . ESOPHAGOGASTRODUODENOSCOPY  09/17/2011   SLF: Mild gastritis/NO SOURCE FOR IRON DEFICINECY ANEMIA IDENTIFIED  . ESOPHAGOGASTRODUODENOSCOPY N/A 12/02/2012   SLF: 1. No source for dyspepsia identified 2. MILD Non-erosive gastritis  . GIVENS CAPSULE STUDY  09/26/2011   Procedure: GIVENS CAPSULE STUDY;  Surgeon: Dorothyann Peng, MD;  Location: [facility] SU[facility];  Service: Endoscopy;  Laterality: N/A;  . ILEOColonoscopy  09/17/2011   SLF: Nl colonoscopy WMO/INTERNA; HEMORRHOIDS/ NO SOURCE FOR IRON DEFICIENCY ANEMA IDENTIFIED     SOCIAL HISTORY:  Social History   Social History  . Marital status: Married    Spouse name: N/A  . Number of children: N/A  . Years of education: N/A   Occupational History  . Not on file.   Social History Main Topics  . Smoking status: Never Smoker  . Smokeless tobacco: Never Used  . Alcohol use No  . Drug use: No  . Sexual  activity: Not on file   Other Topics Concern  . Not on file   Social History Narrative  . No narrative on file    FAMILY HISTORY:  Family History  Problem Relation Age of Onset  . Anesthesia problems Neg Hx   . Hypotension Neg Hx   . Malignant hyperthermia Neg Hx   . Pseudochol deficiency Neg Hx     CURRENT MEDICATIONS:  Outpatient Encounter Prescriptions as of 12/06/2016  Medication Sig  . atorvastatin (LIPITOR) 40 MG tablet Take 40 mg by mouth at bedtime.  . Calcium  Carbonate-Vitamin D (CALCIUM 600+D) 600-400 MG-UNIT tablet Take 2 tablets by mouth 2 (two) times daily.  . clindamycin (CLEOCIN) 300 MG capsule Take 1 capsule (300 mg total) by mouth 3 (three) times daily.  . clobetasol ointment (TEMOVATE) 7.09 % Apply 1 application topically 2 (two) times daily as needed (for irritation).  . diphenhydrAMINE (BENADRYL) 25 MG tablet Take 50 mg by mouth every 6 (six) hours as needed for allergies.   Marland Kitchen docusate sodium (COLACE) 100 MG capsule Take 200 mg by mouth at bedtime as needed for mild constipation.   . enalapril (VASOTEC) 10 MG tablet Take 10 mg by mouth daily.    Marland Kitchen esomeprazole (NEXIUM) 40 MG capsule Take 40 mg by mouth daily before breakfast.  . ferrous sulfate 325 (65 FE) MG tablet Take 325 mg by mouth every other day.  . imipramine (TOFRANIL) 25 MG tablet Take 1 tablet (25 mg total) by mouth at bedtime. (Patient taking differently: Take 25 mg by mouth daily. )  . letrozole (FEMARA) 2.5 MG tablet Take 1 tablet (2.5 mg total) by mouth daily.  Marland Kitchen LORazepam (ATIVAN) 0.5 MG tablet Take 0.5 mg by mouth at bedtime.  . valACYclovir (VALTREX) 500 MG tablet Take 500 mg by mouth as needed (for outbreaks).    No facility-administered encounter medications on file as of 12/06/2016.     ALLERGIES:  Allergies  Allergen Reactions  . Sulfa Antibiotics Itching and Rash  . Tape Rash    Surgical tape causes blisters and rash     PHYSICAL EXAM:  ECOG Performance status: 1 - Symptomatic, but independent.   Vitals:   12/06/16 1113  BP: 131/73  Pulse: (!) 107  Resp: 16  Temp: 99.3 F (37.4 C)     Physical Exam  Constitutional: She is oriented to person, place, and time and well-developed, well-nourished, and in no distress.  HENT:  Head: Normocephalic.  Eyes: Conjunctivae are normal. No scleral icterus.  Cardiovascular: Normal rate, regular rhythm and normal heart sounds.   Pulmonary/Chest: Effort normal and breath sounds normal. No respiratory distress. She  has no wheezes. She has no rales.  Neurological: She is alert and oriented to person, place, and time.  Skin: There is erythema.     Psychiatric: Mood, memory, affect and judgment normal.  Vitals reviewed.    LABORATORY DATA:  I have reviewed the labs as listed.  CBC    Component Value Date/Time   WBC 4.2 09/25/2016 0757   RBC 4.60 09/25/2016 0757   HGB 14.1 09/25/2016 0757   HGB 13.5 06/19/2006 1454   HCT 41.7 09/25/2016 0757   HCT 39.1 06/19/2006 1454   PLT 181 09/25/2016 0757   PLT 193 06/19/2006 1454   MCV 90.7 09/25/2016 0757   MCV 91.8 06/19/2006 1454   MCH 30.7 09/25/2016 0757   MCHC 33.8 09/25/2016 0757   RDW 12.9 09/25/2016 0757   RDW 12.9 06/19/2006 1454  LYMPHSABS 1.6 09/25/2016 0757   LYMPHSABS 1.0 06/19/2006 1454   MONOABS 0.3 09/25/2016 0757   MONOABS 0.3 06/19/2006 1454   EOSABS 0.0 09/25/2016 0757   EOSABS 0.0 06/19/2006 1454   BASOSABS 0.0 09/25/2016 0757   BASOSABS 0.0 06/19/2006 1454   CMP Latest Ref Rng & Units 09/25/2016 05/10/2016 05/09/2016  Glucose 65 - 99 mg/dL 111(H) 79 127(H)  BUN 6 - 20 mg/dL 11 13 18   Creatinine 0.44 - 1.00 mg/dL 0.67 0.65 0.70  Sodium 135 - 145 mmol/L 138 139 139  Potassium 3.5 - 5.1 mmol/L 4.4 4.0 4.0  Chloride 101 - 111 mmol/L 101 107 100(L)  CO2 22 - 32 mmol/L 29 22 -  Calcium 8.9 - 10.3 mg/dL 9.7 9.3 -  Total Protein 6.5 - 8.1 g/dL 7.9 7.3 8.1  Total Bilirubin 0.3 - 1.2 mg/dL 0.9 3.3(H) 1.4(H)  Alkaline Phos 38 - 126 U/L 56 110 63  AST 15 - 41 U/L 28 666(H) 149(H)  ALT 14 - 54 U/L 20 496(H) 67(H)    PENDING LABS:    DIAGNOSTIC IMAGING:  (L) UE ultrasound: 12/06/16    PATHOLOGY:     ASSESSMENT & PLAN:   (L) upper extremity/(L) breast erysipelas vs cellulitis:  -Edema, erythema, and significant skin warmth noted to left upper arm from the mid-forearm up to near axilla. Left breast also with edema, erythema, and skin warmth. No visible signs of abrasion, insect bite, or abscess. Ultrasound LUE negative for  DVT.  -She has low-grade fever today; denies any high fevers. Since Ms. Gheen works in healthcare, I will cover for possible strep and MRSA-related cellulitis vs erysipelas.  Given her sulfa allergy, Clindamycin 300 mg po TID x 10 days prescribed for patient.   -She was given strict return instructions that if she has fever or rigors, she should present to ED for consideration for admission and IV antibiotics.  She does not appear septic; vital signs are stable. Mild tachycardia noted, but blood pressure normal.   -I will have Jaynie Collins, LPN give the patient a call early next week to check on her symptoms.   History of left breast cancer:  -Mammogram due in 03/2017 -Return to cancer center for follow-up as previously scheduled.       Dispo:  -Return to cancer center as scheduled for routine follow-up for breast cancer.    All questions were answered to patient's stated satisfaction. Encouraged patient to call with any new concerns or questions before her next visit to the cancer center and we can certain see her sooner, if needed.    Plan of care discussed with Dr. Talbert Cage, who agrees with the above aforementioned.    Orders placed this encounter:  No orders of the defined types were placed in this encounter.     Mike Craze, NP [place] [phone]

## 2016-12-06 NOTE — Telephone Encounter (Signed)
Patients [place] was negative. NP will see patient. Called patient and made her aware of results and for her to come to cancer center asap. She states she is on the way. See follow up documentation for new orders and plans.

## 2016-12-11 NOTE — Telephone Encounter (Signed)
Called patient to follow up on how she was and if the cellulitis is improving. She states the redness on her breast and inside of her arm is gone. It went away quickly. The outside of her arm and elbow is better. The redness is fading but still there a little bit. She states the area is not hot or warm but still slightly sore. She will finish antibiotic on Saturday. She wants to come Friday am and let us evaluate to be sure she doesn't need anything else. Instructed patient that was fine.

## 2016-12-17 MED FILL — ATORVASTATIN 40 MG TABLET: 40 | 90 days supply | Qty: 90 | Fill #3

## 2016-12-17 MED FILL — LORazepam 0.5 MG TABS: 0.5 | 30 days supply | Qty: 90 | Fill #0

## 2016-12-17 MED FILL — IMIPRAMINE HCL 25 MG TABLET: 25 | 30 days supply | Qty: 30 | Fill #0

## 2017-01-15 MED FILL — ENALAPRIL MALEATE 10 MG TAB: 10 | 90 days supply | Qty: 90 | Fill #2

## 2017-01-15 MED FILL — VALACYCLOVIR HCL 500 MG TAB: 500 | 90 days supply | Qty: 90 | Fill #2

## 2017-01-15 MED FILL — ESOMEPRAZOLE MAG DR 40 MG C: 40 | 90 days supply | Qty: 90 | Fill #2

## 2017-01-16 MED FILL — IMIPRAMINE HCL 25 MG TABLET: 25 | 90 days supply | Qty: 90 | Fill #0

## 2017-02-11 MED FILL — LETROZOLE 2.5 MG TABLET: 2.5 | 90 days supply | Qty: 90 | Fill #0

## 2017-02-27 ENCOUNTER — Other Ambulatory Visit (HOSPITAL_COMMUNITY): Payer: Self-pay | Admitting: Oncology

## 2017-02-27 DIAGNOSIS — Z1231 Encounter for screening mammogram for malignant neoplasm of breast: Secondary | ICD-10-CM

## 2017-03-11 MED FILL — LORazepam 0.5 MG TABS: 0.5 | 30 days supply | Qty: 90 | Fill #1

## 2017-03-12 MED FILL — ATORVASTATIN 20 MG TABLET: 20 | 90 days supply | Qty: 90 | Fill #0

## 2017-03-14 ENCOUNTER — Ambulatory Visit (HOSPITAL_COMMUNITY): Payer: 59

## 2017-03-14 ENCOUNTER — Ambulatory Visit (HOSPITAL_COMMUNITY)
Admission: RE | Admit: 2017-03-14 | Discharge: 2017-03-14 | Disposition: A | Discharge status: Home / Self Care | Payer: 59 | Source: Ambulatory Visit | Attending: Oncology | Admitting: Oncology

## 2017-03-14 DIAGNOSIS — Z1231 Encounter for screening mammogram for malignant neoplasm of breast: Secondary | ICD-10-CM | POA: Diagnosis not present

## 2017-03-14 DIAGNOSIS — Z9889 Other specified postprocedural states: Secondary | ICD-10-CM | POA: Diagnosis not present

## 2017-04-08 MED FILL — ESOMEPRAZOLE MAG DR 40 MG C: 40 | 90 days supply | Qty: 90 | Fill #0

## 2017-04-12 MED FILL — IMIPRAMINE HCL 25 MG TABLET: 25 | 90 days supply | Qty: 90 | Fill #1

## 2017-04-12 MED FILL — ENALAPRIL MALEATE 10 MG TAB: 10 | 90 days supply | Qty: 90 | Fill #3

## 2017-04-12 MED FILL — VALACYCLOVIR HCL 500 MG TAB: 500 | 90 days supply | Qty: 90 | Fill #3

## 2017-05-13 MED FILL — LETROZOLE 2.5 MG TABLET: 2.5 | 90 days supply | Qty: 90 | Fill #1

## 2017-05-21 DIAGNOSIS — M545 Low back pain: Secondary | ICD-10-CM | POA: Diagnosis not present

## 2017-06-07 MED FILL — ATORVASTATIN 20 MG TABLET: 20 | 90 days supply | Qty: 90 | Fill #1 | Status: TO

## 2017-06-10 MED FILL — LORazepam 0.5 MG TABS: 0.5 | 30 days supply | Qty: 90 | Fill #0 | Status: TO

## 2017-07-15 MED FILL — ESOMEPRAZOLE MAG DR 40 MG C: 40 | 90 days supply | Qty: 90 | Fill #1 | Status: TO

## 2017-07-15 MED FILL — ENALAPRIL MALEATE 10 MG TAB: 10 | 90 days supply | Qty: 90 | Fill #0 | Status: TO

## 2017-07-15 MED FILL — IMIPRAMINE HCL 25 MG TABLET: 25 | 90 days supply | Qty: 90 | Fill #2

## 2017-08-12 MED FILL — LETROZOLE 2.5 MG TABLET: 2.5 | 90 days supply | Qty: 90 | Fill #2 | Status: TO

## 2017-08-16 DIAGNOSIS — Z124 Encounter for screening for malignant neoplasm of cervix: Secondary | ICD-10-CM | POA: Diagnosis not present

## 2017-08-16 DIAGNOSIS — Z130 Encounter for screening for diseases of the blood and blood-forming organs and certain disorders involving the immune mechanism: Secondary | ICD-10-CM | POA: Diagnosis not present

## 2017-08-16 DIAGNOSIS — Z01419 Encounter for gynecological examination (general) (routine) without abnormal findings: Secondary | ICD-10-CM | POA: Diagnosis not present

## 2017-08-16 DIAGNOSIS — Z1389 Encounter for screening for other disorder: Secondary | ICD-10-CM | POA: Diagnosis not present

## 2017-08-16 DIAGNOSIS — N952 Postmenopausal atrophic vaginitis: Secondary | ICD-10-CM | POA: Diagnosis not present

## 2017-08-16 DIAGNOSIS — A609 Anogenital herpesviral infection, unspecified: Secondary | ICD-10-CM | POA: Diagnosis not present

## 2017-08-19 MED FILL — VALACYCLOVIR HCL 500 MG TAB: 500 | 90 days supply | Qty: 90 | Fill #0 | Status: TO

## 2017-08-19 MED FILL — CLOBETASOL PROPIONATE 0.05: 0.05 | 23 days supply | Qty: 45 | Fill #0

## 2017-09-18 ENCOUNTER — Ambulatory Visit (HOSPITAL_COMMUNITY): Payer: 59 | Admitting: Hematology & Oncology

## 2017-09-18 ENCOUNTER — Ambulatory Visit (HOSPITAL_COMMUNITY): Payer: 59 | Admitting: Adult Health

## 2017-09-18 ENCOUNTER — Inpatient Hospital Stay (HOSPITAL_COMMUNITY): Payer: 59 | Admitting: Adult Health

## 2017-09-18 ENCOUNTER — Ambulatory Visit (HOSPITAL_COMMUNITY): Payer: 59 | Admitting: Internal Medicine

## 2017-09-26 ENCOUNTER — Other Ambulatory Visit (HOSPITAL_COMMUNITY)
Admission: RE | Admit: 2017-09-26 | Discharge: 2017-09-26 | Disposition: A | Discharge status: Home / Self Care | Payer: PPO | Source: Ambulatory Visit | Attending: Family Medicine | Admitting: Family Medicine

## 2017-09-26 DIAGNOSIS — E7800 Pure hypercholesterolemia, unspecified: Secondary | ICD-10-CM | POA: Insufficient documentation

## 2017-09-26 LAB — COMPREHENSIVE METABOLIC PANEL
ALK PHOS: 59 U/L (ref 38–126)
ALT: 20 U/L (ref 14–54)
ANION GAP: 11 (ref 5–15)
AST: 27 U/L (ref 15–41)
Albumin: 4.6 g/dL (ref 3.5–5.0)
BUN: 10 mg/dL (ref 6–20)
CALCIUM: 9.8 mg/dL (ref 8.9–10.3)
CO2: 26 mmol/L (ref 22–32)
Chloride: 101 mmol/L (ref 101–111)
Creatinine, Ser: 0.81 mg/dL (ref 0.44–1.00)
GFR calc non Af Amer: >60 mL/min (ref >60)
Glucose, Bld: 93 mg/dL (ref 65–99)
Potassium: 3.9 mmol/L (ref 3.5–5.1)
SODIUM: 138 mmol/L (ref 135–145)
TOTAL PROTEIN: 8 g/dL (ref 6.5–8.1)
Total Bilirubin: 0.7 mg/dL (ref 0.3–1.2)

## 2017-09-26 LAB — CBC WITH DIFFERENTIAL/PLATELET
Basophils Absolute: 0 10*3/uL (ref 0.0–0.1)
Basophils Relative: 1 %
Eosinophils Absolute: 0 10*3/uL (ref 0.0–0.7)
Eosinophils Relative: 1 %
HCT: 41.4 % (ref 36.0–46.0)
Hemoglobin: 13.5 g/dL (ref 12.0–15.0)
LYMPHS ABS: 1.3 10*3/uL (ref 0.7–4.0)
LYMPHS PCT: 25 %
MCH: 29.6 pg (ref 26.0–34.0)
MCHC: 32.6 g/dL (ref 30.0–36.0)
MCV: 90.8 fL (ref 78.0–100.0)
MONO ABS: 0.3 10*3/uL (ref 0.1–1.0)
MONOS PCT: 7 %
Neutro Abs: 3.5 10*3/uL (ref 1.7–7.7)
Neutrophils Relative %: 66 %
PLATELETS: 212 10*3/uL (ref 150–400)
RBC: 4.56 MIL/uL (ref 3.87–5.11)
RDW: 13 % (ref 11.5–15.5)
WBC: 5.2 10*3/uL (ref 4.0–10.5)

## 2017-09-26 LAB — LIPID PANEL
Cholesterol: 161 mg/dL (ref 0–200)
HDL: 81 mg/dL (ref >40)
LDL Cholesterol: 66 mg/dL (ref 0–99)
TRIGLYCERIDES: 72 mg/dL (ref <150)
Total CHOL/HDL Ratio: 2 RATIO
VLDL: 14 mg/dL (ref 0–40)

## 2017-09-26 LAB — TSH: TSH: 1.582 u[IU]/mL (ref 0.350–4.500)

## 2017-09-26 LAB — VITAMIN B12: Vitamin B-12: 313 pg/mL (ref 180–914)

## 2017-09-26 LAB — FOLATE: Folate: 19.5 ng/mL (ref >5.9)

## 2017-09-27 LAB — T4: T4 TOTAL: 8.6 ug/dL (ref 4.5–12.0)

## 2017-09-27 LAB — T3: T3 TOTAL: 123 ng/dL (ref 71–180)

## 2017-10-02 DIAGNOSIS — Z0001 Encounter for general adult medical examination with abnormal findings: Secondary | ICD-10-CM | POA: Diagnosis not present

## 2017-10-02 DIAGNOSIS — G4700 Insomnia, unspecified: Secondary | ICD-10-CM | POA: Diagnosis not present

## 2017-10-02 DIAGNOSIS — I10 Essential (primary) hypertension: Secondary | ICD-10-CM | POA: Diagnosis not present

## 2017-10-02 DIAGNOSIS — K2970 Gastritis, unspecified, without bleeding: Secondary | ICD-10-CM | POA: Diagnosis not present

## 2017-10-28 ENCOUNTER — Other Ambulatory Visit: Payer: Self-pay

## 2017-10-28 ENCOUNTER — Encounter (HOSPITAL_COMMUNITY): Payer: Self-pay | Admitting: Internal Medicine

## 2017-10-28 ENCOUNTER — Inpatient Hospital Stay (HOSPITAL_COMMUNITY): Payer: PPO | Attending: Internal Medicine | Admitting: Internal Medicine

## 2017-10-28 VITALS — BP 136/83 | HR 105 | Resp 16 | Ht 65.0 in | Wt 127.0 lb

## 2017-10-28 DIAGNOSIS — Z79811 Long term (current) use of aromatase inhibitors: Secondary | ICD-10-CM

## 2017-10-28 DIAGNOSIS — I10 Essential (primary) hypertension: Secondary | ICD-10-CM | POA: Diagnosis not present

## 2017-10-28 DIAGNOSIS — C50912 Malignant neoplasm of unspecified site of left female breast: Secondary | ICD-10-CM | POA: Diagnosis not present

## 2017-10-28 DIAGNOSIS — Z853 Personal history of malignant neoplasm of breast: Secondary | ICD-10-CM

## 2017-10-28 DIAGNOSIS — Z79810 Long term (current) use of selective estrogen receptor modulators (SERMs): Secondary | ICD-10-CM | POA: Insufficient documentation

## 2017-10-28 MED ORDER — LETROZOLE 2.5 MG PO TABS: 2.5000 mg | ORAL_TABLET | Freq: Every day | ORAL | 3 refills | Status: DC

## 2017-10-28 NOTE — Patient Instructions (Addendum)
[place] at [facility] Discharge Instructions  RECOMMENDATIONS MADE BY THE CONSULTANT AND ANY TEST RESULTS WILL BE SENT TO YOUR REFERRING PHYSICIAN.  You were seen today by Dr. Mathis Dad Higgs Be sure to get your mammogram in July You should be getting your Bone Density scan every 2 years. Follow up in 1 year with our clinic.   Thank you for choosing [place] at [facility] to provide your oncology and hematology care.  To afford each patient quality time with our provider, please arrive at least 15 minutes before your scheduled appointment time.    If you have a lab appointment with the [facility] please come in thru the  Main Entrance and check in at the main information desk  You need to re-schedule your appointment should you arrive 10 or more minutes late.  We strive to give you quality time with our providers, and arriving late affects you and other patients whose appointments are after yours.  Also, if you no show three or more times for appointments you may be dismissed from the clinic at the providers discretion.     Again, thank you for choosing [facility].  Our hope is that these requests will decrease the amount of time that you wait before being seen by our physicians.       _____________________________________________________________  Should you have questions after your visit to [facility], please contact our office at [phone] between the hours of 8:30 a.m. and 4:30 p.m.  Voicemails left after 4:30 p.m. will not be returned until the following business day.  For prescription refill requests, have your pharmacy contact our office.       Resources For Cancer Patients and their Caregivers ? American Cancer Society: Can assist with transportation, wigs, general needs, runs Look Good Feel Better.        579-407-1964 ? Cancer Care: Provides financial assistance, online support groups,  medication/co-pay assistance.  1-800-813-HOPE 858-283-7826) ? [place] Assists [place] cancer patients and their families through emotional , educational and financial support.  218-029-9863 ? [place] DSS Where to apply for food stamps, Medicaid and utility assistance. 301-145-3687 ? [facility]: Transportation to medical appointments. 914-176-5721 ? Social Security Administration: May apply for disability if have a Stage IV cancer. (336)105-9905 504-137-8943 ? [employer], Disability and Transit Services: Assists with nutrition, care and transit needs. [place] Support Programs: @10RELATIVEDAYS @ > Cancer Support Group  2nd Tuesday of the month 1pm-2pm, Journey Room  > Creative Journey  3rd Tuesday of the month 1130am-1pm, Journey Room  > Look Good Feel Better  1st Wednesday of the month 10am-12 noon, Journey Room (Call [place] to register [phone])

## 2017-11-06 NOTE — Progress Notes (Signed)
Diagnosis Hx Breast cancer, IDC, Left, Stage II, receptor+, Her2 - - Plan: DG Bone Density, letrozole (FEMARA) 2.5 MG tablet  Staging Cancer Staging No matching staging information was found for the patient.  Assessment and Plan: 1.  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 Completed 5 years tamoxifen.  Started letrozole in November 2014  Pt had recent mammogram done 03/2017 that was negative.  She is recommended for follow-up imaging in 03/2018.  Pt should be set up for follow-up yearly in July to go over mammogram results.  She will continue Femara as directed and continues to tolerate therapy.  She is given Rx for Femara.    2.  Hypertension.  BP 136/83.  Continue to followup with PCP.   3.  Abnormal LFTs.  This was noted on labs done 05/2016.  Labs done 09/2017 are WNL   4.  Health maintenance.  Continue GI evaluation as recommended.  She will be set up for BMD in 12/2017.     Problem List Patient Active Problem List   Diagnosis Date Noted  . Abnormal LFTs [R94.5]   . Acute calculous cholecystitis [K80.00] 05/09/2016  . Anxiety [F41.9] 05/09/2016  . Hypertension [I10] 05/09/2016  . Cholelithiasis [K80.20] 04/24/2016  . Dyspepsia [R10.13] 11/12/2012  . Chest pain [R07.9] 11/12/2012  . Anemia [D64.9] 08/20/2012  . GERD [K21.9] 01/27/2009  . HEMORRHOIDS [K64.9] 01/26/2009  . Hx Breast cancer, IDC, Left, Stage II, receptor+, Her2 - [Z85.3] 01/26/2005    Past Medical History Past Medical History:  Diagnosis Date  . Breast cancer ([place])   . Functional dyspepsia MAR 2014   APR 2014: PT DECLINED BRAVO. PPIs-PROTOIX/PRILOSEC-->DEXILANT-->NEXIUM(TOO EXPENSIVE)  . GERD (gastroesophageal reflux disease)   . High cholesterol   . Hypertension     Past Surgical History Past Surgical History:  Procedure Laterality Date  . BREAST LUMPECTOMY  2006   Left  breast   . BREAST LUMPECTOMY  1994   Right breast-benign  . CHOLECYSTECTOMY N/A 05/10/2016   Procedure: LAPAROSCOPIC CHOLECYSTECTOMY WITH INTRAOPERATIVE CHOLANGIOGRAM;  Surgeon: Aviva Signs, MD;  Location: [facility];  Service: General;  Laterality: N/A;  . COLONOSCOPY  2009   normal colon without evidence of polyps  . ESOPHAGOGASTRODUODENOSCOPY  2009   normal stomach,esophagues without mass,reosion  . ESOPHAGOGASTRODUODENOSCOPY  09/17/2011   SLF: Mild gastritis/NO SOURCE FOR IRON DEFICINECY ANEMIA IDENTIFIED  . ESOPHAGOGASTRODUODENOSCOPY N/A 12/02/2012   SLF: 1. No source for dyspepsia identified 2. MILD Non-erosive gastritis  . GIVENS CAPSULE STUDY  09/26/2011   Procedure: GIVENS CAPSULE STUDY;  Surgeon: Dorothyann Peng, MD;  Location[facility];  Service: Endoscopy;  Laterality: N/A;  . ILEOColonoscopy  09/17/2011   SLF: Nl colonoscopy WMO/INTERNA; HEMORRHOIDS/ NO SOURCE FOR IRON DEFICIENCY ANEMA IDENTIFIED    Family History Family History  Problem Relation Age of Onset  . Anesthesia problems Neg Hx   . Hypotension Neg Hx   . Malignant hyperthermia Neg Hx   . Pseudochol deficiency Neg Hx      Social History  reports that  has never smoked. she has never used smokeless tobacco. She reports that she does not drink alcohol or use drugs.  Medications  Current Outpatient Medications:  .  atorvastatin (LIPITOR) 20 MG tablet, take 1 tablet by mouth once daily FOR HIGH CHOLESTEROL, Disp: , Rfl: 0 .  Calcium Carbonate-Vitamin D (CALCIUM 600+D) 600-400 MG-UNIT tablet, Take 2  tablets by mouth 2 (two) times daily., Disp: , Rfl:  .  clindamycin (CLEOCIN) 300 MG capsule, Take 1 capsule (300 mg total) by mouth 3 (three) times daily., Disp: 30 capsule, Rfl: 0 .  clobetasol cream (TEMOVATE) 0.05 %, clobetasol 0.05 % topical cream  APPLY A THIN LAYER TO THE AFFECTED AREAS BY TOPICAL ROUTE TWO TIMES PER DAY, Disp: , Rfl:  .  diphenhydrAMINE (BENADRYL) 25 MG tablet, Take 50 mg by mouth every 6 (six) hours as  needed for allergies. , Disp: , Rfl:  .  docusate sodium (COLACE) 100 MG capsule, Take 200 mg by mouth at bedtime as needed for mild constipation. , Disp: , Rfl:  .  enalapril (VASOTEC) 10 MG tablet, Take 10 mg by mouth daily.  , Disp: , Rfl:  .  esomeprazole (NEXIUM) 40 MG capsule, Take 40 mg by mouth daily before breakfast., Disp: , Rfl:  .  ferrous sulfate 325 (65 FE) MG tablet, Take 325 mg by mouth every other day., Disp: , Rfl:  .  imipramine (TOFRANIL) 25 MG tablet, Take 1 tablet (25 mg total) by mouth at bedtime. (Patient taking differently: Take 25 mg by mouth daily. ), Disp: 30 tablet, Rfl: 5 .  letrozole (FEMARA) 2.5 MG tablet, Take 1 tablet (2.5 mg total) by mouth daily., Disp: 90 tablet, Rfl: 3 .  LORazepam (ATIVAN) 0.5 MG tablet, Take 0.5 mg by mouth at bedtime., Disp: , Rfl:  .  valACYclovir (VALTREX) 500 MG tablet, Take 500 mg by mouth as needed (for outbreaks). , Disp: , Rfl: 12  Allergies Sulfa antibiotics and Tape  Review of Systems Review of Systems - Oncology ROS as per HPI otherwise 12 point ROS is negative.   Physical Exam  Vitals Wt Readings from Last 3 Encounters:  10/28/17 127 lb (57.6 kg)  09/18/16 130 lb 12.8 oz (59.3 kg)  06/17/16 130 lb (59 kg)   Temp Readings from Last 3 Encounters:  12/06/16 99.3 F (37.4 C) (Oral)  09/18/16 98 F (36.7 C) (Oral)  06/17/16 98.1 F (36.7 C) (Oral)   BP Readings from Last 3 Encounters:  10/28/17 136/83  12/06/16 131/73  09/18/16 129/84   Pulse Readings from Last 3 Encounters:  10/28/17 (!) 105  12/06/16 (!) 107  09/18/16 (!) 102   Constitutional: Well-developed, well-nourished, and in no distress.   HENT: Head: Normocephalic and atraumatic.  Mouth/Throat: No oropharyngeal exudate. Mucosa moist. Eyes: Pupils are equal, round, and reactive to light. Conjunctivae are normal. No scleral icterus.  Neck: Normal range of motion. Neck supple. No JVD present.  Cardiovascular: Normal rate, regular rhythm and normal  heart sounds.  Exam reveals no gallop and no friction rub.   No murmur heard. Pulmonary/Chest: Effort normal and breath sounds normal. No respiratory distress. No wheezes.No rales.  Abdominal: Soft. Bowel sounds are normal. No distension. There is no tenderness. There is no guarding.  Musculoskeletal: No edema or tenderness.  Lymphadenopathy: No cervical, axillary or supraclavicular adenopathy.  Neurological: Alert and oriented to person, place, and time. No cranial nerve deficit.  Skin: Skin is warm and dry. No rash noted. No erythema. No pallor.  Psychiatric: Affect and judgment normal.  Bilateral breast exam:  Left breast radiation changes.  Right breast shows no palpable abnormalities.    Labs No visits with results within 3 Day(s) from this visit.  Latest known visit with results is:  Hospital Outpatient Visit on 09/26/2017  Component Date Value Ref Range Status  . Sodium 09/26/2017 138  135 - 145 mmol/L Final  . Potassium 09/26/2017 3.9  3.5 - 5.1 mmol/L Final  . Chloride 09/26/2017 101  101 - 111 mmol/L Final  . CO2 09/26/2017 26  22 - 32 mmol/L Final  . Glucose, Bld 09/26/2017 93  65 - 99 mg/dL Final  . BUN 09/26/2017 10  6 - 20 mg/dL Final  . Creatinine, Ser 09/26/2017 0.81  0.44 - 1.00 mg/dL Final  . Calcium 09/26/2017 9.8  8.9 - 10.3 mg/dL Final  . Total Protein 09/26/2017 8.0  6.5 - 8.1 g/dL Final  . Albumin 09/26/2017 4.6  3.5 - 5.0 g/dL Final  . AST 09/26/2017 27  15 - 41 U/L Final  . ALT 09/26/2017 20  14 - 54 U/L Final  . Alkaline Phosphatase 09/26/2017 59  38 - 126 U/L Final  . Total Bilirubin 09/26/2017 0.7  0.3 - 1.2 mg/dL Final  . GFR calc non Af Amer 09/26/2017 >60  >60 mL/min Final  . GFR calc Af Amer 09/26/2017 >60  >60 mL/min Final   Comment: (NOTE) The eGFR has been calculated using the CKD EPI equation. This calculation has not been validated in all clinical situations. eGFR's persistently <60 mL/min signify possible Chronic Kidney Disease.   . Anion  gap 09/26/2017 11  5 - 15 Final  . Cholesterol 09/26/2017 161  0 - 200 mg/dL Final  . Triglycerides 09/26/2017 72  <150 mg/dL Final  . HDL 09/26/2017 81  >40 mg/dL Final  . Total CHOL/HDL Ratio 09/26/2017 2.0  RATIO Final  . VLDL 09/26/2017 14  0 - 40 mg/dL Final  . LDL Cholesterol 09/26/2017 66  0 - 99 mg/dL Final   Comment:        Total Cholesterol/HDL:CHD Risk Coronary Heart Disease Risk Table                     Men   Women  1/2 Average Risk   3.4   3.3  Average Risk       5.0   4.4  2 X Average Risk   9.6   7.1  3 X Average Risk  23.4   11.0        Use the calculated Patient Ratio above and the CHD Risk Table to determine the patient's CHD Risk.        ATP III CLASSIFICATION (LDL):  <100     mg/dL   Optimal  100-129  mg/dL   Near or Above                    Optimal  130-159  mg/dL   Borderline  160-189  mg/dL   High  >190     mg/dL   Very High   . WBC 09/26/2017 5.2  4.0 - 10.5 K/uL Final  . RBC 09/26/2017 4.56  3.87 - 5.11 MIL/uL Final  . Hemoglobin 09/26/2017 13.5  12.0 - 15.0 g/dL Final  . HCT 09/26/2017 41.4  36.0 - 46.0 % Final  . MCV 09/26/2017 90.8  78.0 - 100.0 fL Final  . MCH 09/26/2017 29.6  26.0 - 34.0 pg Final  . MCHC 09/26/2017 32.6  30.0 - 36.0 g/dL Final  . RDW 09/26/2017 13.0  11.5 - 15.5 % Final  . Platelets 09/26/2017 212  150 - 400 K/uL Final  . Neutrophils Relative % 09/26/2017 66  % Final  . Neutro Abs 09/26/2017 3.5  1.7 - 7.7 K/uL Final  . Lymphocytes Relative 09/26/2017 25  %  Final  . Lymphs Abs 09/26/2017 1.3  0.7 - 4.0 K/uL Final  . Monocytes Relative 09/26/2017 7  % Final  . Monocytes Absolute 09/26/2017 0.3  0.1 - 1.0 K/uL Final  . Eosinophils Relative 09/26/2017 1  % Final  . Eosinophils Absolute 09/26/2017 0.0  0.0 - 0.7 K/uL Final  . Basophils Relative 09/26/2017 1  % Final  . Basophils Absolute 09/26/2017 0.0  0.0 - 0.1 K/uL Final  . TSH 09/26/2017 1.582  0.350 - 4.500 uIU/mL Final   Performed by a 3rd Generation assay with a  functional sensitivity of <=0.01 uIU/mL.  . T3, Total 09/26/2017 123  71 - 180 ng/dL Final   Comment: (NOTE) Performed At: [facility] [place], [place] [id number] Rush Farmer MD [id number]   . T4, Total 09/26/2017 8.6  4.5 - 12.0 ug/dL Final   Comment: (NOTE) Performed At: [facility] [place], [place] [id number] Rush Farmer MD [id number]   . Vitamin B-12 09/26/2017 313  180 - 914 pg/mL Final   Comment: (NOTE) This assay is not validated for testing neonatal or myeloproliferative syndrome specimens for Vitamin B12 levels. Performed at [facility], [place] [address] [place], [place] [address]   . Folate 09/26/2017 19.5  >5.9 ng/mL Final   Performed at [facility], [place] [address], [place], [place] [address]     Pathology Orders Placed This Encounter  Procedures  . DG Bone Density    Standing Status:   Future    Standing Expiration Date:   10/28/2018    Order Specific Question:   Reason for Exam (SYMPTOM  OR DIAGNOSIS REQUIRED)    Answer:   breast cancer, osteopenia    Order Specific Question:   Preferred imaging location?    Answer:   [place] done 03/14/2017:  IMPRESSION: No mammographic evidence of malignancy.  RECOMMENDATION: Screening mammogram in one year. (Code:SM-B-01Y)    Zoila Shutter MD

## 2018-01-21 ENCOUNTER — Other Ambulatory Visit (HOSPITAL_COMMUNITY): Payer: Self-pay | Admitting: Internal Medicine

## 2018-01-21 DIAGNOSIS — Z780 Asymptomatic menopausal state: Secondary | ICD-10-CM

## 2018-01-21 DIAGNOSIS — Z79899 Other long term (current) drug therapy: Secondary | ICD-10-CM

## 2018-03-19 ENCOUNTER — Other Ambulatory Visit (HOSPITAL_COMMUNITY): Payer: Self-pay | Admitting: Hematology

## 2018-03-19 DIAGNOSIS — Z1231 Encounter for screening mammogram for malignant neoplasm of breast: Secondary | ICD-10-CM

## 2018-03-20 ENCOUNTER — Ambulatory Visit (HOSPITAL_COMMUNITY)
Admission: RE | Admit: 2018-03-20 | Discharge: 2018-03-20 | Disposition: A | Discharge status: Home / Self Care | Payer: PPO | Source: Ambulatory Visit | Attending: Hematology | Admitting: Hematology

## 2018-03-20 ENCOUNTER — Encounter (HOSPITAL_COMMUNITY): Payer: Self-pay

## 2018-03-20 DIAGNOSIS — Z1231 Encounter for screening mammogram for malignant neoplasm of breast: Secondary | ICD-10-CM | POA: Diagnosis not present

## 2018-03-21 ENCOUNTER — Ambulatory Visit (HOSPITAL_COMMUNITY): Payer: PPO

## 2018-06-25 ENCOUNTER — Other Ambulatory Visit: Payer: Self-pay

## 2018-06-25 ENCOUNTER — Encounter (HOSPITAL_COMMUNITY): Payer: Self-pay | Admitting: Emergency Medicine

## 2018-06-25 ENCOUNTER — Emergency Department (HOSPITAL_COMMUNITY): Payer: PPO | Admitting: Anesthesiology

## 2018-06-25 ENCOUNTER — Emergency Department (HOSPITAL_COMMUNITY)
Admission: EM | Admit: 2018-06-25 | Discharge: 2018-06-25 | Disposition: A | Discharge status: Home / Self Care | Payer: PPO | Attending: General Surgery | Admitting: General Surgery

## 2018-06-25 ENCOUNTER — Other Ambulatory Visit (HOSPITAL_COMMUNITY): Payer: Self-pay | Admitting: Family Medicine

## 2018-06-25 ENCOUNTER — Encounter (HOSPITAL_COMMUNITY)
Admission: EM | Disposition: A | Discharge status: Home / Self Care | Payer: Self-pay | Source: Home / Self Care | Attending: Emergency Medicine

## 2018-06-25 ENCOUNTER — Ambulatory Visit (HOSPITAL_COMMUNITY)
Admission: RE | Admit: 2018-06-25 | Discharge: 2018-06-25 | Disposition: A | Discharge status: Home / Self Care | Payer: PPO | Source: Ambulatory Visit | Attending: Family Medicine | Admitting: Family Medicine

## 2018-06-25 DIAGNOSIS — R109 Unspecified abdominal pain: Secondary | ICD-10-CM

## 2018-06-25 DIAGNOSIS — Z9049 Acquired absence of other specified parts of digestive tract: Secondary | ICD-10-CM | POA: Insufficient documentation

## 2018-06-25 DIAGNOSIS — I10 Essential (primary) hypertension: Secondary | ICD-10-CM | POA: Diagnosis not present

## 2018-06-25 DIAGNOSIS — Z853 Personal history of malignant neoplasm of breast: Secondary | ICD-10-CM | POA: Diagnosis not present

## 2018-06-25 DIAGNOSIS — K3580 Unspecified acute appendicitis: Secondary | ICD-10-CM | POA: Insufficient documentation

## 2018-06-25 DIAGNOSIS — K219 Gastro-esophageal reflux disease without esophagitis: Secondary | ICD-10-CM | POA: Insufficient documentation

## 2018-06-25 DIAGNOSIS — Z79899 Other long term (current) drug therapy: Secondary | ICD-10-CM | POA: Insufficient documentation

## 2018-06-25 DIAGNOSIS — Z882 Allergy status to sulfonamides status: Secondary | ICD-10-CM | POA: Diagnosis not present

## 2018-06-25 DIAGNOSIS — E7800 Pure hypercholesterolemia, unspecified: Secondary | ICD-10-CM | POA: Diagnosis not present

## 2018-06-25 DIAGNOSIS — Z888 Allergy status to other drugs, medicaments and biological substances status: Secondary | ICD-10-CM | POA: Insufficient documentation

## 2018-06-25 DIAGNOSIS — Z79811 Long term (current) use of aromatase inhibitors: Secondary | ICD-10-CM | POA: Diagnosis not present

## 2018-06-25 DIAGNOSIS — K37 Unspecified appendicitis: Secondary | ICD-10-CM | POA: Diagnosis not present

## 2018-06-25 HISTORY — PX: LAPAROSCOPIC APPENDECTOMY: SHX408

## 2018-06-25 LAB — CBC WITH DIFFERENTIAL/PLATELET
Abs Immature Granulocytes: 0.08 10*3/uL — ABNORMAL HIGH (ref 0.00–0.07)
BASOS ABS: 0 10*3/uL (ref 0.0–0.1)
BASOS PCT: 0 %
EOS ABS: 0 10*3/uL (ref 0.0–0.5)
EOS PCT: 0 %
HCT: 42.8 % (ref 36.0–46.0)
Hemoglobin: 13.9 g/dL (ref 12.0–15.0)
Immature Granulocytes: 1 %
Lymphocytes Relative: 7 %
Lymphs Abs: 1.3 10*3/uL (ref 0.7–4.0)
MCH: 29.3 pg (ref 26.0–34.0)
MCHC: 32.5 g/dL (ref 30.0–36.0)
MCV: 90.3 fL (ref 80.0–100.0)
MONO ABS: 1 10*3/uL (ref 0.1–1.0)
Monocytes Relative: 6 %
NRBC: 0 % (ref 0.0–0.2)
Neutro Abs: 14.7 10*3/uL — ABNORMAL HIGH (ref 1.7–7.7)
Neutrophils Relative %: 86 %
PLATELETS: 230 10*3/uL (ref 150–400)
RBC: 4.74 MIL/uL (ref 3.87–5.11)
RDW: 12.5 % (ref 11.5–15.5)
WBC: 17.1 10*3/uL — AB (ref 4.0–10.5)

## 2018-06-25 LAB — COMPREHENSIVE METABOLIC PANEL
ALK PHOS: 64 U/L (ref 38–126)
ALT: 16 U/L (ref 0–44)
ANION GAP: 9 (ref 5–15)
AST: 25 U/L (ref 15–41)
Albumin: 4.5 g/dL (ref 3.5–5.0)
BUN: 7 mg/dL — ABNORMAL LOW (ref 8–23)
CALCIUM: 9.1 mg/dL (ref 8.9–10.3)
CO2: 25 mmol/L (ref 22–32)
Chloride: 92 mmol/L — ABNORMAL LOW (ref 98–111)
Creatinine, Ser: 0.58 mg/dL (ref 0.44–1.00)
GFR calc non Af Amer: >60 mL/min (ref >60)
GLUCOSE: 105 mg/dL — AB (ref 70–99)
POTASSIUM: 4.1 mmol/L (ref 3.5–5.1)
SODIUM: 126 mmol/L — AB (ref 135–145)
TOTAL PROTEIN: 8.3 g/dL — AB (ref 6.5–8.1)
Total Bilirubin: 1.5 mg/dL — ABNORMAL HIGH (ref 0.3–1.2)

## 2018-06-25 LAB — URINALYSIS, ROUTINE W REFLEX MICROSCOPIC
Bilirubin Urine: NEGATIVE
Glucose, UA: NEGATIVE mg/dL
Hgb urine dipstick: NEGATIVE
KETONES UR: 20 mg/dL — AB
LEUKOCYTES UA: NEGATIVE
NITRITE: NEGATIVE
PH: 6 (ref 5.0–8.0)
Protein, ur: NEGATIVE mg/dL
SPECIFIC GRAVITY, URINE: 1.005 (ref 1.005–1.030)

## 2018-06-25 LAB — LIPASE, BLOOD: Lipase: 27 U/L (ref 11–51)

## 2018-06-25 SURGERY — APPENDECTOMY, LAPAROSCOPIC
Anesthesia: General

## 2018-06-25 MED ORDER — MIDAZOLAM HCL 2 MG/2ML IJ SOLN: 0.5000 mg | Freq: Once | INTRAMUSCULAR | Status: DC | PRN

## 2018-06-25 MED ORDER — POVIDONE-IODINE 10 % EX OINT
TOPICAL_OINTMENT | CUTANEOUS | Status: AC
  Filled 2018-06-25: qty 1

## 2018-06-25 MED ORDER — POVIDONE-IODINE 10 % OINT PACKET
TOPICAL_OINTMENT | CUTANEOUS | Status: DC | PRN
  Administered 2018-06-25: 1 via TOPICAL

## 2018-06-25 MED ORDER — SODIUM CHLORIDE 0.9 % IV BOLUS
1000.0000 mL | Freq: Once | INTRAVENOUS | Status: AC
  Administered 2018-06-25: 1000 mL via INTRAVENOUS

## 2018-06-25 MED ORDER — MORPHINE SULFATE (PF) 4 MG/ML IV SOLN
4.0000 mg | Freq: Once | INTRAVENOUS | Status: AC
  Administered 2018-06-25: 4 mg via INTRAVENOUS
  Filled 2018-06-25: qty 1

## 2018-06-25 MED ORDER — DEXAMETHASONE SODIUM PHOSPHATE 10 MG/ML IJ SOLN
INTRAMUSCULAR | Status: DC | PRN
  Administered 2018-06-25: 4 mg via INTRAVENOUS

## 2018-06-25 MED ORDER — SUCCINYLCHOLINE CHLORIDE 20 MG/ML IJ SOLN
INTRAMUSCULAR | Status: AC
  Filled 2018-06-25: qty 1

## 2018-06-25 MED ORDER — ROCURONIUM BROMIDE 50 MG/5ML IV SOLN
INTRAVENOUS | Status: AC
  Filled 2018-06-25: qty 1

## 2018-06-25 MED ORDER — KETOROLAC TROMETHAMINE 30 MG/ML IJ SOLN
INTRAMUSCULAR | Status: AC
  Filled 2018-06-25: qty 1

## 2018-06-25 MED ORDER — SODIUM CHLORIDE 0.9 % IV SOLN
2.0000 g | Freq: Once | INTRAVENOUS | Status: AC
  Administered 2018-06-25: 2 g via INTRAVENOUS
  Filled 2018-06-25: qty 20

## 2018-06-25 MED ORDER — PROMETHAZINE HCL 25 MG/ML IJ SOLN: 6.2500 mg | INTRAMUSCULAR | Status: DC | PRN

## 2018-06-25 MED ORDER — METRONIDAZOLE IN NACL 5-0.79 MG/ML-% IV SOLN
500.0000 mg | Freq: Once | INTRAVENOUS | Status: AC
  Administered 2018-06-25: 500 mg via INTRAVENOUS
  Filled 2018-06-25: qty 100

## 2018-06-25 MED ORDER — DEXAMETHASONE SODIUM PHOSPHATE 4 MG/ML IJ SOLN
INTRAMUSCULAR | Status: AC
  Filled 2018-06-25: qty 1

## 2018-06-25 MED ORDER — SUGAMMADEX SODIUM 500 MG/5ML IV SOLN
INTRAVENOUS | Status: DC | PRN
  Administered 2018-06-25: 118 mg via INTRAVENOUS

## 2018-06-25 MED ORDER — LACTATED RINGERS IV SOLN
INTRAVENOUS | Status: DC
  Administered 2018-06-25: 18:00:00 via INTRAVENOUS

## 2018-06-25 MED ORDER — SODIUM CHLORIDE 0.9 % IR SOLN
Status: DC | PRN
  Administered 2018-06-25: 1000 mL

## 2018-06-25 MED ORDER — SUGAMMADEX SODIUM 200 MG/2ML IV SOLN
INTRAVENOUS | Status: AC
  Filled 2018-06-25: qty 2

## 2018-06-25 MED ORDER — MIDAZOLAM HCL 2 MG/2ML IJ SOLN
INTRAMUSCULAR | Status: AC
  Filled 2018-06-25: qty 2

## 2018-06-25 MED ORDER — BUPIVACAINE LIPOSOME 1.3 % IJ SUSP
INTRAMUSCULAR | Status: DC | PRN
  Administered 2018-06-25: 20 mL

## 2018-06-25 MED ORDER — CHLORHEXIDINE GLUCONATE CLOTH 2 % EX PADS: 6.0000 | MEDICATED_PAD | Freq: Once | CUTANEOUS | Status: DC

## 2018-06-25 MED ORDER — BUPIVACAINE LIPOSOME 1.3 % IJ SUSP
INTRAMUSCULAR | Status: AC
  Filled 2018-06-25: qty 20

## 2018-06-25 MED ORDER — FENTANYL CITRATE (PF) 250 MCG/5ML IJ SOLN
INTRAMUSCULAR | Status: AC
  Filled 2018-06-25: qty 5

## 2018-06-25 MED ORDER — LIDOCAINE HCL (CARDIAC) PF 50 MG/5ML IV SOSY
PREFILLED_SYRINGE | INTRAVENOUS | Status: DC | PRN
  Administered 2018-06-25: 50 mg via INTRAVENOUS

## 2018-06-25 MED ORDER — PROPOFOL 10 MG/ML IV BOLUS
INTRAVENOUS | Status: DC | PRN
  Administered 2018-06-25: 150 mg via INTRAVENOUS

## 2018-06-25 MED ORDER — ROCURONIUM BROMIDE 100 MG/10ML IV SOLN
INTRAVENOUS | Status: DC | PRN
  Administered 2018-06-25: 50 mg via INTRAVENOUS

## 2018-06-25 MED ORDER — ONDANSETRON HCL 4 MG/2ML IJ SOLN
INTRAMUSCULAR | Status: DC | PRN
  Administered 2018-06-25: 4 mg via INTRAVENOUS

## 2018-06-25 MED ORDER — PROPOFOL 10 MG/ML IV BOLUS
INTRAVENOUS | Status: AC
  Filled 2018-06-25: qty 20

## 2018-06-25 MED ORDER — LIDOCAINE HCL (PF) 1 % IJ SOLN
INTRAMUSCULAR | Status: AC
  Filled 2018-06-25: qty 5

## 2018-06-25 MED ORDER — HYDROMORPHONE HCL 1 MG/ML IJ SOLN: 0.2500 mg | INTRAMUSCULAR | Status: DC | PRN

## 2018-06-25 MED ORDER — ONDANSETRON HCL 4 MG/2ML IJ SOLN
INTRAMUSCULAR | Status: AC
  Filled 2018-06-25: qty 2

## 2018-06-25 MED ORDER — FENTANYL CITRATE (PF) 100 MCG/2ML IJ SOLN
INTRAMUSCULAR | Status: DC | PRN
  Administered 2018-06-25: 125 ug via INTRAVENOUS
  Administered 2018-06-25: 75 ug via INTRAVENOUS

## 2018-06-25 MED ORDER — PHENYLEPHRINE HCL 10 MG/ML IJ SOLN
INTRAMUSCULAR | Status: DC | PRN
  Administered 2018-06-25 (×3): 100 ug via INTRAVENOUS

## 2018-06-25 MED ORDER — TRAMADOL HCL 50 MG PO TABS: 50.0000 mg | ORAL_TABLET | Freq: Four times a day (QID) | ORAL | 0 refills | Status: DC | PRN

## 2018-06-25 MED ORDER — ONDANSETRON HCL 4 MG/2ML IJ SOLN
4.0000 mg | Freq: Once | INTRAMUSCULAR | Status: AC
  Administered 2018-06-25: 4 mg via INTRAVENOUS
  Filled 2018-06-25: qty 2

## 2018-06-25 MED ORDER — HYDROCODONE-ACETAMINOPHEN 7.5-325 MG PO TABS: 1.0000 | ORAL_TABLET | Freq: Once | ORAL | Status: DC | PRN

## 2018-06-25 MED ORDER — KETOROLAC TROMETHAMINE 30 MG/ML IJ SOLN
INTRAMUSCULAR | Status: DC | PRN
  Administered 2018-06-25: 15 mg via INTRAVENOUS

## 2018-06-25 SURGICAL SUPPLY — 56 items
APL SRG 38 LTWT LNG FL B (MISCELLANEOUS) ×1
APPLICATOR ARISTA FLEXITIP XL (MISCELLANEOUS) ×1 IMPLANT
BAG RETRIEVAL 10 (BASKET) ×1
CHLORAPREP W/TINT 26ML (MISCELLANEOUS) ×2 IMPLANT
CLOTH BEACON ORANGE TIMEOUT ST (SAFETY) ×2 IMPLANT
COVER LIGHT HANDLE STERIS (MISCELLANEOUS) ×4 IMPLANT
CUTTER FLEX LINEAR 45M (STAPLE) ×2 IMPLANT
DECANTER SPIKE VIAL GLASS SM (MISCELLANEOUS) ×2 IMPLANT
DRSG TEGADERM 2-3/8X2-3/4 SM (GAUZE/BANDAGES/DRESSINGS) ×3 IMPLANT
ELECT REM PT RETURN 9FT ADLT (ELECTROSURGICAL) ×2
ELECTRODE REM PT RTRN 9FT ADLT (ELECTROSURGICAL) ×1 IMPLANT
EVACUATOR SMOKE 8.L (FILTER) ×2 IMPLANT
GLOVE BIOGEL M 6.5 STRL (GLOVE) ×1 IMPLANT
GLOVE BIOGEL PI IND STRL 6.5 (GLOVE) IMPLANT
GLOVE BIOGEL PI IND STRL 7.0 (GLOVE) ×2 IMPLANT
GLOVE BIOGEL PI INDICATOR 6.5 (GLOVE) ×1
GLOVE BIOGEL PI INDICATOR 7.0 (GLOVE) ×3
GLOVE SURG SS PI 7.5 STRL IVOR (GLOVE) ×2 IMPLANT
GOWN STRL REUS W/ TWL XL LVL3 (GOWN DISPOSABLE) ×1 IMPLANT
GOWN STRL REUS W/TWL LRG LVL3 (GOWN DISPOSABLE) ×2 IMPLANT
GOWN STRL REUS W/TWL XL LVL3 (GOWN DISPOSABLE) ×2
HEMOSTAT ARISTA ABSORB 1G (MISCELLANEOUS) ×1 IMPLANT
INST SET LAPROSCOPIC AP (KITS) ×2 IMPLANT
IV NS IRRIG 3000ML ARTHROMATIC (IV SOLUTION) IMPLANT
KIT TURNOVER KIT A (KITS) ×2 IMPLANT
MANIFOLD NEPTUNE II (INSTRUMENTS) ×2 IMPLANT
NDL HYPO 18GX1.5 BLUNT FILL (NEEDLE) ×1 IMPLANT
NDL INSUFFLATION 14GA 120MM (NEEDLE) ×1 IMPLANT
NEEDLE HYPO 18GX1.5 BLUNT FILL (NEEDLE) ×2 IMPLANT
NEEDLE HYPO 22GX1.5 SAFETY (NEEDLE) ×2 IMPLANT
NEEDLE INSUFFLATION 14GA 120MM (NEEDLE) ×2 IMPLANT
NS IRRIG 1000ML POUR BTL (IV SOLUTION) ×2 IMPLANT
PACK LAP CHOLE LZT030E (CUSTOM PROCEDURE TRAY) ×2 IMPLANT
PAD ARMBOARD 7.5X6 YLW CONV (MISCELLANEOUS) ×2 IMPLANT
PENCIL HANDSWITCHING (ELECTRODE) ×2 IMPLANT
RELOAD 45 VASCULAR/THIN (ENDOMECHANICALS) IMPLANT
RELOAD STAPLE 45 2.5 WHT GRN (ENDOMECHANICALS) IMPLANT
RELOAD STAPLE 45 3.5 BLU ETS (ENDOMECHANICALS) IMPLANT
RELOAD STAPLE TA45 3.5 REG BLU (ENDOMECHANICALS) IMPLANT
SET BASIN LINEN APH (SET/KITS/TRAYS/PACK) ×2 IMPLANT
SET TUBE IRRIG SUCTION NO TIP (IRRIGATION / IRRIGATOR) IMPLANT
SHEARS HARMONIC ACE PLUS 36CM (ENDOMECHANICALS) ×2 IMPLANT
SPONGE GAUZE 2X2 8PLY STRL LF (GAUZE/BANDAGES/DRESSINGS) ×6 IMPLANT
STAPLER VISISTAT (STAPLE) ×2 IMPLANT
SUT VICRYL 0 UR6 27IN ABS (SUTURE) ×2 IMPLANT
SYR 20CC LL (SYRINGE) ×4 IMPLANT
SYS BAG RETRIEVAL 10MM (BASKET) ×1
SYSTEM BAG RETRIEVAL 10MM (BASKET) ×1 IMPLANT
TRAY FOLEY W/BAG SLVR 16FR (SET/KITS/TRAYS/PACK) ×2
TRAY FOLEY W/BAG SLVR 16FR ST (SET/KITS/TRAYS/PACK) ×1 IMPLANT
TROCAR ENDO BLADELESS 11MM (ENDOMECHANICALS) ×2 IMPLANT
TROCAR ENDO BLADELESS 12MM (ENDOMECHANICALS) ×2 IMPLANT
TROCAR XCEL NON-BLD 5MMX100MML (ENDOMECHANICALS) ×2 IMPLANT
TUBING INSUFFLATION (TUBING) ×2 IMPLANT
WARMER LAPAROSCOPE (MISCELLANEOUS) ×2 IMPLANT
YANKAUER SUCT 12FT TUBE ARGYLE (SUCTIONS) ×2 IMPLANT

## 2018-06-25 NOTE — H&P (Signed)
[name] [name] is an 67 y.o. female.   Chief Complaint: Acute appendicitis HPI: Patient is a 67 year old white female who has been having upper abdominal pain for the past 24 hours.  Today, it went into the right lower quadrant of the abdomen.  Patient was seen by her primary care physician and outpatient CT scan was ordered.  CT scan of the abdomen reveals acute appendicitis.  Patient was then brought in the emergency room for surgical evaluation.  She does have decreased appetite.  No fever chills have been noted.  No diarrhea has been noted.  Past Medical History:  Diagnosis Date  . Breast cancer ([place])   . Functional dyspepsia MAR 2014   APR 2014: PT DECLINED BRAVO. PPIs-PROTOIX/PRILOSEC-->DEXILANT-->NEXIUM(TOO EXPENSIVE)  . GERD (gastroesophageal reflux disease)   . High cholesterol   . Hypertension     Past Surgical History:  Procedure Laterality Date  . BREAST LUMPECTOMY  2006   Left  breast  . BREAST LUMPECTOMY  1994   Right breast-benign  . CHOLECYSTECTOMY N/A 05/10/2016   Procedure: LAPAROSCOPIC CHOLECYSTECTOMY WITH INTRAOPERATIVE CHOLANGIOGRAM;  Surgeon: Aviva Signs, MD;  Location: [facility];  Service: General;  Laterality: N/A;  . COLONOSCOPY  2009   normal colon without evidence of polyps  . ESOPHAGOGASTRODUODENOSCOPY  2009   normal stomach,esophagues without mass,reosion  . ESOPHAGOGASTRODUODENOSCOPY  09/17/2011   SLF: Mild gastritis/NO SOURCE FOR IRON DEFICINECY ANEMIA IDENTIFIED  . ESOPHAGOGASTRODUODENOSCOPY N/A 12/02/2012   SLF: 1. No source for dyspepsia identified 2. MILD Non-erosive gastritis  . GIVENS CAPSULE STUDY  09/26/2011   Procedure: GIVENS CAPSULE STUDY;  Surgeon: Dorothyann Peng, MD;  Location: [facility];  Service: Endoscopy;  Laterality: N/A;  . ILEOColonoscopy  09/17/2011   SLF: Nl colonoscopy WMO/INTERNA; HEMORRHOIDS/ NO SOURCE FOR IRON DEFICIENCY ANEMA IDENTIFIED    Family History  Problem Relation Age of Onset  . Anesthesia problems Neg Hx   .  Hypotension Neg Hx   . Malignant hyperthermia Neg Hx   . Pseudochol deficiency Neg Hx    Social History:  reports that she has never smoked. She has never used smokeless tobacco. She reports that she does not drink alcohol or use drugs.  Allergies:  Allergies  Allergen Reactions  . Sulfa Antibiotics Itching and Rash  . Tape Rash    Surgical tape causes blisters and rash     (Not in a hospital admission)  Results for orders placed or performed during the hospital encounter of 06/25/18 (from the past 48 hour(s))  CBC with Differential     Status: Abnormal   Collection Time: 06/25/18  4:25 PM  Result Value Ref Range   WBC 17.1 (H) 4.0 - 10.5 K/uL   RBC 4.74 3.87 - 5.11 MIL/uL   Hemoglobin 13.9 12.0 - 15.0 g/dL   HCT 42.8 36.0 - 46.0 %   MCV 90.3 80.0 - 100.0 fL   MCH 29.3 26.0 - 34.0 pg   MCHC 32.5 30.0 - 36.0 g/dL   RDW 12.5 11.5 - 15.5 %   Platelets 230 150 - 400 K/uL   nRBC 0.0 0.0 - 0.2 %   Neutrophils Relative % 86 %   Neutro Abs 14.7 (H) 1.7 - 7.7 K/uL   Lymphocytes Relative 7 %   Lymphs Abs 1.3 0.7 - 4.0 K/uL   Monocytes Relative 6 %   Monocytes Absolute 1.0 0.1 - 1.0 K/uL   Eosinophils Relative 0 %   Eosinophils Absolute 0.0 0.0 - 0.5 K/uL   Basophils  Relative 0 %   Basophils Absolute 0.0 0.0 - 0.1 K/uL   Immature Granulocytes 1 %   Abs Immature Granulocytes 0.08 (H) 0.00 - 0.07 K/uL    Comment: Performed at [facility], [address], [place], [place] [address]   Ct Abdomen Pelvis Wo Contrast  Result Date: 06/25/2018 CLINICAL DATA:  Acute upper abdominal pain. EXAM: CT ABDOMEN AND PELVIS WITHOUT CONTRAST TECHNIQUE: Multidetector CT imaging of the abdomen and pelvis was performed following the standard protocol without IV contrast. COMPARISON:  CT scan of August 11, 2008. FINDINGS: Lower chest: No acute abnormality. Hepatobiliary: No focal liver abnormality is seen. Status post cholecystectomy. No biliary dilatation. Pancreas: Unremarkable. No pancreatic ductal  dilatation or surrounding inflammatory changes. Spleen: Normal in size without focal abnormality. Adrenals/Urinary Tract: Adrenal glands are unremarkable. Kidneys are normal, without renal calculi, focal lesion, or hydronephrosis. Bladder is unremarkable. Stomach/Bowel: The stomach appears normal. There is no evidence of bowel obstruction. The appendix is enlarged with surrounding inflammation consistent with appendicitis. Appendix: Location: Right lower quadrant. Diameter: 12 mm. Appendicolith: No. Mucosal hyper-enhancement: No. Extraluminal gas: No. Periappendiceal collection: No. Vascular/Lymphatic: No significant vascular findings are present. No enlarged abdominal or pelvic lymph nodes. Reproductive: Uterus and bilateral adnexa are unremarkable. Other: No abdominal wall hernia or abnormality. No abdominopelvic ascites. Musculoskeletal: Grade 1 anterolisthesis of L5-S1 is noted secondary to bilateral L5 pars defects. IMPRESSION: Findings consistent with acute appendicitis. No abscess is noted. These results will be called to the ordering clinician or representative by the Radiologist Assistant, and communication documented in the PACS or zVision Dashboard. Electronically Signed   By: Marijo Conception, M.D.   On: 06/25/2018 14:29    Review of Systems  Constitutional: Positive for malaise/fatigue.  HENT: Negative.   Eyes: Negative.   Respiratory: Negative.   Cardiovascular: Negative.   Gastrointestinal: Positive for abdominal pain.  Genitourinary: Negative.   Musculoskeletal: Negative.   Skin: Negative.   Neurological: Negative.   Endo/Heme/Allergies: Negative.   Psychiatric/Behavioral: Negative.     Blood pressure 114/70, pulse 95, temperature 97.9 F (36.6 C), temperature source Oral, resp. rate 18, height 5\' 5"  (1.651 m), weight 59 kg, SpO2 100 %. Physical Exam  Vitals reviewed. Constitutional: She is oriented to person, place, and time. She appears well-developed and well-nourished. No  distress.  HENT:  Head: Normocephalic and atraumatic.  Cardiovascular: Normal rate, regular rhythm and normal heart sounds. Exam reveals no gallop and no friction rub.  No murmur heard. Respiratory: Effort normal and breath sounds normal. No respiratory distress. She has no wheezes. She has no rales.  GI: Soft. Bowel sounds are normal. She exhibits no distension. There is tenderness. There is guarding. There is no rebound.  Tender in the right lower quadrant to palpation.  No rigidity is noted.  Neurological: She is alert and oriented to person, place, and time.  Skin: Skin is warm, dry and intact.  CT scan images personally reviewed  Assessment/Plan Impression: Acute appendicitis Plan: Patient will be taken to the operating room for laparoscopic appendectomy.  The risks and benefits of the procedure including bleeding, infection, and the possibility of an open procedure were fully explained to the patient, who gave informed consent.  Aviva Signs, MD 06/25/2018, 4:50 PM

## 2018-06-25 NOTE — ED Provider Notes (Signed)
[facility] Provider Note   CSN: [id number] Arrival date & time: 06/25/18  1518     History   Chief Complaint Chief Complaint  Patient presents with  . Abdominal Pain    appendicitis, confirmed with CT    HPI [name] [name] is a 67 y.o. female.  Pt presents to the ED today with abdominal pain.  She said her pain started yesterday.  She saw her pcp who ordered a CT which showed acute appendicitis.  Pt was sent here for treatment.  The pt is not on any blood thinners.     Past Medical History:  Diagnosis Date  . Breast cancer ([place])   . Functional dyspepsia MAR 2014   APR 2014: PT DECLINED BRAVO. PPIs-PROTOIX/PRILOSEC-->DEXILANT-->NEXIUM(TOO EXPENSIVE)  . GERD (gastroesophageal reflux disease)   . High cholesterol   . Hypertension     Patient Active Problem List   Diagnosis Date Noted  . Abnormal LFTs   . Acute calculous cholecystitis 05/09/2016  . Anxiety 05/09/2016  . Hypertension 05/09/2016  . Cholelithiasis 04/24/2016  . Dyspepsia 11/12/2012  . Chest pain 11/12/2012  . Anemia 08/20/2012  . GERD 01/27/2009  . HEMORRHOIDS 01/26/2009  . 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 BREAST LUMPECTOMY  2006   Left  breast  . BREAST LUMPECTOMY  1994   Right breast-benign  . CHOLECYSTECTOMY N/A 05/10/2016   Procedure: LAPAROSCOPIC CHOLECYSTECTOMY WITH INTRAOPERATIVE CHOLANGIOGRAM;  Surgeon: Aviva Signs, MD;  Location: [facility];  Service: General;  Laterality: N/A;  . COLONOSCOPY  2009   normal colon without evidence of polyps  . ESOPHAGOGASTRODUODENOSCOPY  2009   normal stomach,esophagues without mass,reosion  . ESOPHAGOGASTRODUODENOSCOPY  09/17/2011   SLF: Mild gastritis/NO SOURCE FOR IRON DEFICINECY ANEMIA IDENTIFIED  . ESOPHAGOGASTRODUODENOSCOPY N/A 12/02/2012   SLF: 1. No source for dyspepsia identified 2. MILD Non-erosive gastritis  . GIVENS CAPSULE STUDY   09/26/2011   Procedure: GIVENS CAPSULE STUDY;  Surgeon: Dorothyann Peng, MD;  Location: [facility];  Service: Endoscopy;  Laterality: N/A;  . ILEOColonoscopy  09/17/2011   SLF: Nl colonoscopy WMO/INTERNA; HEMORRHOIDS/ NO SOURCE FOR IRON DEFICIENCY ANEMA IDENTIFIED     OB History    Gravida  1   Para  1   Term  1   Preterm      AB      Living        SAB      TAB      Ectopic      Multiple      Live Births               Home Medications    Prior to Admission medications   Medication Sig Start Date End Date Taking? Authorizing Provider  atorvastatin (LIPITOR) 20 MG tablet Take 20 mg by mouth at bedtime.  09/09/17  Yes [provider]  Calcium Carbonate-Vitamin D (CALCIUM 600+D) 600-400 MG-UNIT tablet Take 2 tablets by mouth 2 (two) times daily.   Yes [provider]  docusate sodium (COLACE) 100 MG capsule Take 200 mg by mouth at bedtime as needed for mild constipation.    Yes [provider]  enalapril (VASOTEC) 10 MG tablet Take 10 mg by mouth daily.     Yes [provider]  esomeprazole (NEXIUM) 40 MG capsule Take 40 mg by mouth daily before breakfast.   Yes [provider]  imipramine (  TOFRANIL) 25 MG tablet Take 1 tablet (25 mg total) by mouth at bedtime. Patient taking differently: Take 25 mg by mouth daily.  05/28/16  Yes Mahala Menghini, PA-C  letrozole [facility]) 2.5 MG tablet Take 1 tablet (2.5 mg total) by mouth daily. 10/28/17  Yes Higgs, Mathis Dad, MD  valACYclovir (VALTREX) 500 MG tablet Take 500 mg by mouth as needed (for outbreaks).     [provider]    Family History Family History  Problem Relation Age of Onset  . Anesthesia problems Neg Hx   . Hypotension Neg Hx   . Malignant hyperthermia Neg Hx   . Pseudochol deficiency Neg Hx     Social History Social History   Tobacco Use  . Smoking status: Never Smoker  . Smokeless tobacco: Never Used  Substance Use Topics  . Alcohol use: No  . Drug  use: No     Allergies   Sulfa antibiotics and Tape   Review of Systems Review of Systems  Gastrointestinal: Positive for abdominal pain.  All other systems reviewed and are negative.    Physical Exam Updated Vital Signs BP 114/70   Pulse 95   Temp 97.9 F (36.6 C) (Oral)   Resp 18   Ht _0  (1.651 m)   Wt 59 kg   SpO2 100%   BMI 21.63 kg/m   Physical Exam  Constitutional: She is oriented to person, place, and time. She appears well-developed and well-nourished.  HENT:  Head: Normocephalic and atraumatic.  Mouth/Throat: Oropharynx is clear and moist.  Eyes: Pupils are equal, round, and reactive to light. EOM are normal.  Cardiovascular: Normal rate, regular rhythm, normal heart sounds and intact distal pulses.  Pulmonary/Chest: Effort normal and breath sounds normal.  Abdominal: Normal appearance. There is tenderness in the right lower quadrant.  Neurological: She is alert and oriented to person, place, and time.  Skin: Skin is warm. Capillary refill takes less than 2 seconds.  Psychiatric: She has a normal mood and affect. Her behavior is normal.  Nursing note and vitals reviewed.    ED Treatments / Results  Labs (all labs ordered are listed, but only abnormal results are displayed) Labs Reviewed  CBC WITH DIFFERENTIAL/PLATELET - Abnormal; Notable for the following components:      Result Value   WBC 17.1 (*)    Neutro Abs 14.7 (*)    Abs Immature Granulocytes 0.08 (*)    All other components within normal limits  COMPREHENSIVE METABOLIC PANEL  LIPASE, BLOOD  URINALYSIS, ROUTINE W REFLEX MICROSCOPIC    EKG EKG Interpretation  Date/Time:  Wednesday June 25 2018 16:09:59 EDT Ventricular Rate:  89 PR Interval:    QRS Duration: 91 QT Interval:  365 QTC Calculation: 445 R Axis:   58 Text Interpretation:  Sinus rhythm Baseline wander in lead(s) V2 Confirmed by Isla Pence [phone]) on 06/25/2018 4:18:47 PM   Radiology Ct Abdomen Pelvis Wo  Contrast  Result Date: 06/25/2018 CLINICAL DATA:  Acute upper abdominal pain. EXAM: CT ABDOMEN AND PELVIS WITHOUT CONTRAST TECHNIQUE: Multidetector CT imaging of the abdomen and pelvis was performed following the standard protocol without IV contrast. COMPARISON:  CT scan of August 11, 2008. FINDINGS: Lower chest: No acute abnormality. Hepatobiliary: No focal liver abnormality is seen. Status post cholecystectomy. No biliary dilatation. Pancreas: Unremarkable. No pancreatic ductal dilatation or surrounding inflammatory changes. Spleen: Normal in size without focal abnormality. Adrenals/Urinary Tract: Adrenal glands are unremarkable. Kidneys are normal, without renal calculi, focal lesion, or hydronephrosis. Bladder  is unremarkable. Stomach/Bowel: The stomach appears normal. There is no evidence of bowel obstruction. The appendix is enlarged with surrounding inflammation consistent with appendicitis. Appendix: Location: Right lower quadrant. Diameter: 12 mm. Appendicolith: No. Mucosal hyper-enhancement: No. Extraluminal gas: No. Periappendiceal collection: No. Vascular/Lymphatic: No significant vascular findings are present. No enlarged abdominal or pelvic lymph nodes. Reproductive: Uterus and bilateral adnexa are unremarkable. Other: No abdominal wall hernia or abnormality. No abdominopelvic ascites. Musculoskeletal: Grade 1 anterolisthesis of L5-S1 is noted secondary to bilateral L5 pars defects. IMPRESSION: Findings consistent with acute appendicitis. No abscess is noted. These results will be called to the ordering clinician or representative by the Radiologist Assistant, and communication documented in the PACS or zVision Dashboard. Electronically Signed   By: Marijo Conception, M.D.   On: 06/25/2018 14:29    Procedures Procedures (including critical care time)  Medications Ordered in ED Medications  sodium chloride 0.9 % bolus 1,000 mL (1,000 mLs Intravenous New Bag/Given 06/25/18 1628)  cefTRIAXone  (ROCEPHIN) 2 g in sodium chloride 0.9 % 100 mL IVPB (2 g Intravenous New Bag/Given 06/25/18 1627)    And  metroNIDAZOLE (FLAGYL) IVPB 500 mg (has no administration in time range)  morphine 4 MG/ML injection 4 mg (4 mg Intravenous Given 06/25/18 1622)  ondansetron (ZOFRAN) injection 4 mg (4 mg Intravenous Given 06/25/18 1622)     Initial Impression / Assessment and Plan / ED Course  I have reviewed the triage vital signs and the nursing notes.  Pertinent labs & imaging results that were available during my care of the patient were reviewed by me and considered in my medical decision making (see chart for details).    Pt d/w Dr. Arnoldo Morale (surgery) who will see her in the ED and admit.  Final Clinical Impressions(s) / ED Diagnoses   Final diagnoses:  Acute appendicitis, unspecified acute appendicitis type    ED Discharge Orders    None       Isla Pence, MD 06/25/18 [phone]

## 2018-06-25 NOTE — ED Triage Notes (Signed)
Patient started having abdominal pain yesterday. CT done today, sent to ED for appendicitis.

## 2018-06-25 NOTE — Anesthesia Preprocedure Evaluation (Signed)
Anesthesia Evaluation  Patient identified by MRN, date of birth, ID band Patient awake  General Assessment Comment:Reports one incident of awareness remotely -denies PTSD related to it Numerous GA without trouble after   Reviewed: Allergy & Precautions, NPO status , Patient's Chart, lab work & pertinent test results  History of Anesthesia Complications (+) history of anesthetic complications  Airway Mallampati: I  TM Distance: >3 FB Neck ROM: Full    Dental no notable dental hx. (+) Teeth Intact   Pulmonary neg pulmonary ROS,    Pulmonary exam normal breath sounds clear to auscultation       Cardiovascular Exercise Tolerance: Good hypertension, Pt. on medications negative cardio ROS Normal cardiovascular examI Rhythm:Regular Rate:Normal     Neuro/Psych Anxiety negative neurological ROS  negative psych ROS   GI/Hepatic Neg liver ROS, GERD  Medicated and Controlled,  Endo/Other  negative endocrine ROS  Renal/GU negative Renal ROS  negative genitourinary   Musculoskeletal negative musculoskeletal ROS (+)   Abdominal   Peds negative pediatric ROS (+)  Hematology negative hematology ROS (+) anemia ,   Anesthesia Other Findings H/o Breast cancer s/p CT/RT last remotely   Reproductive/Obstetrics negative OB ROS                             Anesthesia Physical Anesthesia Plan  ASA: II and emergent  Anesthesia Plan: General   Post-op Pain Management:    Induction: Intravenous  PONV Risk Score and Plan:   Airway Management Planned: Oral ETT  Additional Equipment:   Intra-op Plan:   Post-operative Plan: Extubation in OR  Informed Consent: I have reviewed the patients History and Physical, chart, labs and discussed the procedure including the risks, benefits and alternatives for the proposed anesthesia with the patient or authorized representative who has indicated his/her  understanding and acceptance.   Dental advisory given  Plan Discussed with:   Anesthesia Plan Comments:         Anesthesia Quick Evaluation

## 2018-06-25 NOTE — ED Notes (Signed)
Pt left with OR RN for procedure

## 2018-06-25 NOTE — Op Note (Signed)
Patient:  [name] [name]  DOB:  06-25-1951  MRN:  [id number]   Preop Diagnosis: Acute appendicitis  Postop Diagnosis: Same  Procedure: Laparoscopic appendectomy  Surgeon: Aviva Signs, MD  Anes: General endotracheal  Indications: Patient is a 67 year old white female who presents with right lower quadrant abdominal pain.  She was found on CT scan of the abdomen to have acute appendicitis.  The risks and benefits of the procedure including bleeding, infection, and the possibility of an open procedure were fully explained to the patient, who gave informed consent.  Procedure note: Patient was placed in supine position.  After induction of general endotracheal anesthesia, the abdomen was prepped and draped using the usual sterile technique with DuraPrep.  Surgical site confirmation was performed.  A supraumbilical incision was made down to the fascia.  A Veress needle was introduced into the abdominal cavity and confirmation of placement was done using the saline drop test.  The abdomen was then insufflated to 15 mmHg pressure.  An 11 mm trocar was placed into the abdominal cavity under direct visualization without difficulty.  The patient was placed in deeper Trendelenburg position and an additional 12 mm trocar was placed in the suprapubic region and a 5 mm trocar was placed left lower quadrant region.  The appendix was visualized and noted to be acutely inflamed.  The mesoappendix was divided using the harmonic scalpel.  A vascular Endo GIA was placed across the base of the appendix at the juncture of the cecum.  The appendix was removed using an Endo Catch bag without difficulty.  The staple line was inspected and noted within normal limits.  Arista was placed along the appendiceal mesentery.  All fluid and air were then evacuated from the abdominal cavity prior to the removal of the trochars.  All wounds were irrigated with normal saline.  All wounds were injected with Exparel.  The  supraumbilical fascia as well as suprapubic fascia were reapproximated using 0 Vicryl interrupted sutures.  All skin incisions were closed using staples.  Betadine ointment and dry sterile dressings were applied.  All tape needle counts were correct at the end of the procedure.  Patient was extubated in the operating room and transferred to PACU in stable condition.  Complications: None  EBL: Minimal  Specimen: Appendix

## 2018-06-25 NOTE — Transfer of Care (Signed)
Immediate Anesthesia Transfer of Care Note  Patient: [name] [name]  Procedure(s) Performed: APPENDECTOMY LAPAROSCOPIC (N/A )  Patient Location: PACU  Anesthesia Type:General  Level of Consciousness: awake, alert  and oriented  Airway & Oxygen Therapy: Patient Spontanous Breathing  Post-op Assessment: Report given to RN  Post vital signs: Reviewed and stable  Last Vitals:  Vitals Value Taken Time  BP 114/74 06/25/2018  6:38 PM  Temp    Pulse 110 06/25/2018  6:39 PM  Resp 15 06/25/2018  6:40 PM  SpO2 84 % 06/25/2018  6:39 PM  Vitals shown include unvalidated device data.  Last Pain:  Vitals:   06/25/18 1728  TempSrc: Oral  PainSc: 8       Patients Stated Pain Goal: 7 (03/00/92 3300)  Complications: No apparent anesthesia complications

## 2018-06-25 NOTE — Discharge Instructions (Signed)
Laparoscopic Appendectomy, Adult, Care After °Refer to this sheet in the next few weeks. These instructions provide you with information about caring for yourself after your procedure. Your health care provider may also give you more specific instructions. Your treatment has been planned according to current medical practices, but problems sometimes occur. Call your health care provider if you have any problems or questions after your procedure. °What can I expect after the procedure? °After the procedure, it is common to have: °· A decrease in your energy level. °· Mild pain in the area where the surgical cuts (incisions) were made. °· Constipation. This can be caused by pain medicine and a decrease in your activity. ° °Follow these instructions at home: °Medicines °· Take over-the-counter and prescription medicines only as told by your health care provider. °· Do not drive for 24 hours if you received a sedative. °· Do not drive or operate heavy machinery while taking prescription pain medicine. °· If you were prescribed an antibiotic medicine, take it as told by your health care provider. Do not stop taking the antibiotic even if you start to feel better. °Activity °· For 3 weeks or as long as told by your health care provider: °? Do not lift anything that is heavier than 10 pounds (4.5 kg). °? Do not play contact sports. °· Gradually return to your normal activities. Ask your health care provider what activities are safe for you. °Bathing °· Keep your incisions clean and dry. Clean them as often as told by your health care provider: °? Gently wash the incisions with soap and water. °? Rinse the incisions with water to remove all soap. °? Pat the incisions dry with a clean towel. Do not rub the incisions. °· You may take showers after 48 hours. °· Do not take baths, swim, or use hot tubs for 2 weeks or as told by your health care provider. °Incision care °· Follow instructions from your healthcare provider about  how to take care of your incisions. Make sure you: °? Wash your hands with soap and water before you change your bandage (dressing). If soap and water are not available, use hand sanitizer. °? Change your dressing as told by your health care provider. °? Leave stitches (sutures), skin glue, or adhesive strips in place. These skin closures may need to stay in place for 2 weeks or longer. If adhesive strip edges start to loosen and curl up, you may trim the loose edges. Do not remove adhesive strips completely unless your health care provider tells you to do that. °· Check your incision areas every day for signs of infection. Check for: °? More redness, swelling, or pain. °? More fluid or blood. °? Warmth. °? Pus or a bad smell. °Other Instructions °· If you were sent home with a drain, follow instructions from your health care provider about how to care for the drain and how to empty it. °· Take deep breaths. This helps to prevent your lungs from becoming inflamed. °· To relieve and prevent constipation: °? Drink plenty of fluids. °? Eat plenty of fruits and vegetables. °· Keep all follow-up visits as told by your health care provider. This is important. °Contact a health care provider if: °· You have more redness, swelling, or pain around an incision. °· You have more fluid or blood coming from an incision. °· Your incision feels warm to the touch. °· You have pus or a bad smell coming from an incision or dressing. °· Your incision   edges break open after your sutures have been removed. °· You have increasing pain in your shoulders. °· You feel dizzy or you faint. °· You develop shortness of breath. °· You keep feeling nauseous or vomiting. °· You have diarrhea or you cannot control your bowel functions. °· You lose your appetite. °· You develop swelling or pain in your legs. °Get help right away if: °· You have a fever. °· You develop a rash. °· You have difficulty breathing. °· You have sharp pains in your  chest. °This information is not intended to replace advice given to you by your health care provider. Make sure you discuss any questions you have with your health care provider. °Document Released: 08/20/2005 Document Revised: 01/20/2016 Document Reviewed: 02/07/2015 °Elsevier Interactive Patient Education © 2018 [employer] ° °

## 2018-06-25 NOTE — Anesthesia Procedure Notes (Signed)
Procedure Name: Intubation Date/Time: 06/25/2018 5:49 PM Performed by: Lenice Llamas, MD Pre-anesthesia Checklist: Patient identified, Patient being monitored, Timeout performed, Emergency Drugs available and Suction available Patient Re-evaluated:Patient Re-evaluated prior to induction Oxygen Delivery Method: Circle System Utilized Preoxygenation: Pre-oxygenation with 100% oxygen Induction Type: IV induction Laryngoscope Size: Mac and 3 Grade View: Grade I Tube type: Oral Tube size: 7.0 mm Number of attempts: 1 Airway Equipment and Method: Stylet Placement Confirmation: ETT inserted through vocal cords under direct vision,  positive ETCO2 and breath sounds checked- equal and bilateral Secured at: 21 cm Tube secured with: Tape Dental Injury: Teeth and Oropharynx as per pre-operative assessment

## 2018-06-26 ENCOUNTER — Encounter (HOSPITAL_COMMUNITY): Payer: Self-pay | Admitting: General Surgery

## 2018-06-26 NOTE — Anesthesia Postprocedure Evaluation (Signed)
Anesthesia Post Note  Patient: [name] [name]  Procedure(s) Performed: APPENDECTOMY LAPAROSCOPIC (N/A )  Patient location during evaluation: Other (pt discharged from pacu without incident) Anesthesia Type: General Level of consciousness: awake and awake and alert Pain management: pain level controlled Vital Signs Assessment: post-procedure vital signs reviewed and stable Respiratory status: spontaneous breathing and nonlabored ventilation Cardiovascular status: blood pressure returned to baseline and stable Postop Assessment: no headache, no backache, adequate PO intake, able to ambulate and no apparent nausea or vomiting Anesthetic complications: no     Last Vitals:  Vitals:   06/25/18 1930 06/25/18 1934  BP: 97/66 100/63  Pulse: (!) 103 (!) 103  Resp: 18 16  Temp:  37.2 C  SpO2: 97% 95%    Last Pain:  Vitals:   06/25/18 1934  TempSrc: Oral  PainSc: 0-No pain                 Talbert Forest Hammond Obeirne

## 2018-07-01 ENCOUNTER — Ambulatory Visit (INDEPENDENT_AMBULATORY_CARE_PROVIDER_SITE_OTHER): Payer: Self-pay | Admitting: General Surgery

## 2018-07-01 ENCOUNTER — Encounter: Payer: Self-pay | Admitting: General Surgery

## 2018-07-01 VITALS — BP 137/84 | HR 121 | Temp 96.9°F | Resp 18 | Wt 125.4 lb

## 2018-07-01 DIAGNOSIS — Z09 Encounter for follow-up examination after completed treatment for conditions other than malignant neoplasm: Secondary | ICD-10-CM

## 2018-07-01 NOTE — Progress Notes (Signed)
Subjective:     [name] [name]  Status post laparoscopic appendectomy.  Doing well.  Has no complaints. Objective:    BP 137/84 (BP Location: Right Arm, Patient Position: Sitting, Cuff Size: Normal)   Pulse (!) 121   Temp (!) 96.9 F (36.1 C) (Temporal)   Resp 18   Wt 125 lb 6.4 oz (56.9 kg)   BMI 20.87 kg/m   General:  alert, cooperative and no distress  Abdomen soft, incisions healing well.  Staples removed, Steri-Strips applied. Final pathology consistent with diagnosis.     Assessment:    Doing well postoperatively.    Plan:   May return to work without restrictions on 07/04/2018.  Follow-up here as needed.

## 2018-08-25 ENCOUNTER — Ambulatory Visit (HOSPITAL_COMMUNITY)
Admission: RE | Admit: 2018-08-25 | Discharge: 2018-08-25 | Disposition: A | Discharge status: Home / Self Care | Payer: PPO | Source: Ambulatory Visit | Attending: Internal Medicine | Admitting: Internal Medicine

## 2018-08-25 DIAGNOSIS — Z780 Asymptomatic menopausal state: Secondary | ICD-10-CM | POA: Diagnosis not present

## 2018-08-25 DIAGNOSIS — M8589 Other specified disorders of bone density and structure, multiple sites: Secondary | ICD-10-CM | POA: Diagnosis not present

## 2018-08-25 DIAGNOSIS — Z79899 Other long term (current) drug therapy: Secondary | ICD-10-CM | POA: Insufficient documentation

## 2018-10-02 ENCOUNTER — Other Ambulatory Visit (HOSPITAL_COMMUNITY)
Admission: RE | Admit: 2018-10-02 | Discharge: 2018-10-02 | Disposition: A | Discharge status: Home / Self Care | Payer: PPO | Source: Ambulatory Visit | Attending: Family Medicine | Admitting: Family Medicine

## 2018-10-02 DIAGNOSIS — K2970 Gastritis, unspecified, without bleeding: Secondary | ICD-10-CM | POA: Diagnosis not present

## 2018-10-02 DIAGNOSIS — Z0001 Encounter for general adult medical examination with abnormal findings: Secondary | ICD-10-CM | POA: Diagnosis not present

## 2018-10-02 DIAGNOSIS — G4700 Insomnia, unspecified: Secondary | ICD-10-CM | POA: Insufficient documentation

## 2018-10-02 DIAGNOSIS — I10 Essential (primary) hypertension: Secondary | ICD-10-CM | POA: Diagnosis not present

## 2018-10-02 LAB — COMPREHENSIVE METABOLIC PANEL
ALT: 15 U/L (ref 0–44)
ANION GAP: 6 (ref 5–15)
AST: 23 U/L (ref 15–41)
Albumin: 4.2 g/dL (ref 3.5–5.0)
Alkaline Phosphatase: 58 U/L (ref 38–126)
BUN: 16 mg/dL (ref 8–23)
CHLORIDE: 109 mmol/L (ref 98–111)
CO2: 27 mmol/L (ref 22–32)
Calcium: 9.7 mg/dL (ref 8.9–10.3)
Creatinine, Ser: 0.65 mg/dL (ref 0.44–1.00)
Glucose, Bld: 93 mg/dL (ref 70–99)
POTASSIUM: 4.3 mmol/L (ref 3.5–5.1)
Sodium: 142 mmol/L (ref 135–145)
Total Bilirubin: 0.5 mg/dL (ref 0.3–1.2)
Total Protein: 7.8 g/dL (ref 6.5–8.1)

## 2018-10-02 LAB — VITAMIN B12: Vitamin B-12: 265 pg/mL (ref 180–914)

## 2018-10-02 LAB — CBC WITH DIFFERENTIAL/PLATELET
ABS IMMATURE GRANULOCYTES: 0.02 10*3/uL (ref 0.00–0.07)
BASOS ABS: 0 10*3/uL (ref 0.0–0.1)
BASOS PCT: 1 %
Eosinophils Absolute: 0 10*3/uL (ref 0.0–0.5)
Eosinophils Relative: 1 %
HCT: 40.5 % (ref 36.0–46.0)
HEMOGLOBIN: 12.5 g/dL (ref 12.0–15.0)
Immature Granulocytes: 0 %
LYMPHS PCT: 29 %
Lymphs Abs: 1.5 10*3/uL (ref 0.7–4.0)
MCH: 27.4 pg (ref 26.0–34.0)
MCHC: 30.9 g/dL (ref 30.0–36.0)
MCV: 88.6 fL (ref 80.0–100.0)
MONO ABS: 0.3 10*3/uL (ref 0.1–1.0)
Monocytes Relative: 5 %
NEUTROS ABS: 3.4 10*3/uL (ref 1.7–7.7)
Neutrophils Relative %: 64 %
PLATELETS: 212 10*3/uL (ref 150–400)
RBC: 4.57 MIL/uL (ref 3.87–5.11)
RDW: 13.3 % (ref 11.5–15.5)
WBC: 5.3 10*3/uL (ref 4.0–10.5)
nRBC: 0 % (ref 0.0–0.2)

## 2018-10-02 LAB — LIPID PANEL
CHOLESTEROL: 149 mg/dL (ref 0–200)
HDL: 69 mg/dL (ref >40)
LDL CALC: 70 mg/dL (ref 0–99)
TRIGLYCERIDES: 49 mg/dL (ref <150)
Total CHOL/HDL Ratio: 2.2 RATIO
VLDL: 10 mg/dL (ref 0–40)

## 2018-10-02 LAB — FOLATE: FOLATE: 8.9 ng/mL (ref >5.9)

## 2018-10-02 LAB — TSH: TSH: 1.284 u[IU]/mL (ref 0.350–4.500)

## 2018-10-03 LAB — T3, FREE: T3, Free: 3.2 pg/mL (ref 2.0–4.4)

## 2018-10-03 LAB — T4: T4, Total: 9.1 ug/dL (ref 4.5–12.0)

## 2018-10-07 DIAGNOSIS — Z8711 Personal history of peptic ulcer disease: Secondary | ICD-10-CM | POA: Diagnosis not present

## 2018-10-07 DIAGNOSIS — Z9049 Acquired absence of other specified parts of digestive tract: Secondary | ICD-10-CM | POA: Diagnosis not present

## 2018-10-07 DIAGNOSIS — E7800 Pure hypercholesterolemia, unspecified: Secondary | ICD-10-CM | POA: Diagnosis not present

## 2018-10-07 DIAGNOSIS — I10 Essential (primary) hypertension: Secondary | ICD-10-CM | POA: Diagnosis not present

## 2018-10-16 ENCOUNTER — Other Ambulatory Visit (HOSPITAL_COMMUNITY): Payer: Self-pay | Admitting: Emergency Medicine

## 2018-10-16 DIAGNOSIS — Z853 Personal history of malignant neoplasm of breast: Secondary | ICD-10-CM

## 2018-10-16 MED ORDER — LETROZOLE 2.5 MG PO TABS: 2.5000 mg | ORAL_TABLET | Freq: Every day | ORAL | 3 refills | Status: DC

## 2018-10-21 ENCOUNTER — Other Ambulatory Visit (HOSPITAL_COMMUNITY)
Admission: RE | Admit: 2018-10-21 | Discharge: 2018-10-21 | Disposition: A | Discharge status: Home / Self Care | Payer: PPO | Source: Ambulatory Visit | Attending: Family Medicine | Admitting: Family Medicine

## 2018-10-21 DIAGNOSIS — E538 Deficiency of other specified B group vitamins: Secondary | ICD-10-CM | POA: Diagnosis not present

## 2018-10-21 DIAGNOSIS — Z79899 Other long term (current) drug therapy: Secondary | ICD-10-CM | POA: Insufficient documentation

## 2018-10-21 LAB — VITAMIN B12: VITAMIN B 12: 245 pg/mL (ref 180–914)

## 2018-10-21 LAB — FOLATE: Folate: 11.7 ng/mL (ref >5.9)

## 2018-10-22 LAB — HOMOCYSTEINE: Homocysteine: 13 umol/L (ref 0.0–17.2)

## 2018-10-24 LAB — METHYLMALONIC ACID, SERUM: Methylmalonic Acid, Quantitative: 382 nmol/L — ABNORMAL HIGH (ref 0–378)

## 2018-10-28 ENCOUNTER — Other Ambulatory Visit: Payer: Self-pay

## 2018-10-28 ENCOUNTER — Inpatient Hospital Stay (HOSPITAL_COMMUNITY): Payer: PPO | Attending: Hematology | Admitting: Hematology

## 2018-10-28 ENCOUNTER — Encounter (HOSPITAL_COMMUNITY): Payer: Self-pay | Admitting: Hematology

## 2018-10-28 VITALS — BP 126/84 | HR 77 | Temp 97.7°F | Resp 16 | Wt 132.9 lb

## 2018-10-28 DIAGNOSIS — Z170 Estrogen receptor positive status [ER+]: Secondary | ICD-10-CM

## 2018-10-28 DIAGNOSIS — Z79811 Long term (current) use of aromatase inhibitors: Secondary | ICD-10-CM

## 2018-10-28 DIAGNOSIS — Z79899 Other long term (current) drug therapy: Secondary | ICD-10-CM | POA: Diagnosis not present

## 2018-10-28 DIAGNOSIS — Z923 Personal history of irradiation: Secondary | ICD-10-CM

## 2018-10-28 DIAGNOSIS — I10 Essential (primary) hypertension: Secondary | ICD-10-CM | POA: Diagnosis not present

## 2018-10-28 DIAGNOSIS — C50912 Malignant neoplasm of unspecified site of left female breast: Secondary | ICD-10-CM | POA: Diagnosis not present

## 2018-10-28 DIAGNOSIS — M8580 Other specified disorders of bone density and structure, unspecified site: Secondary | ICD-10-CM | POA: Diagnosis not present

## 2018-10-28 DIAGNOSIS — Z853 Personal history of malignant neoplasm of breast: Secondary | ICD-10-CM

## 2018-10-28 NOTE — Progress Notes (Signed)
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 female returns for routine follow-up left breast cancer. She is having itching under her left breast. Mostly when she wears her bra. Her left breast is enlarged and has changes from her radiation. She routinely gets her mammograms. She remains active and takes her vitamin D and calcium daily. Denies any nausea, vomiting, or diarrhea. Denies any new pains. Had not noticed any recent bleeding such as epistaxis, hematuria or hematochezia. Denies recent chest pain on exertion, shortness of breath on minimal exertion, pre-syncopal episodes, or palpitations. Denies any numbness or tingling in hands or feet. Denies any recent fevers, infections, or recent hospitalizations. Patient reports appetite at 100% and energy level at 100%. She is eating well and maintaining her weight at this time.    REVIEW OF SYSTEMS:  Review of Systems  Skin: Positive for itching (under left breast).  All other systems reviewed and are negative.    PAST MEDICAL/SURGICAL HISTORY:  Past Medical History:  Diagnosis Date  . Breast cancer ([place])   . Functional dyspepsia MAR 2014   APR 2014: PT DECLINED BRAVO. PPIs-PROTOIX/PRILOSEC-->DEXILANT-->NEXIUM(TOO EXPENSIVE)  . GERD (gastroesophageal reflux disease)   . High cholesterol   . Hypertension    Past Surgical History:  Procedure Laterality Date  . BREAST LUMPECTOMY  2006   Left  breast  . BREAST LUMPECTOMY  1994   Right breast-benign  . CHOLECYSTECTOMY N/A 05/10/2016   Procedure: LAPAROSCOPIC CHOLECYSTECTOMY WITH INTRAOPERATIVE CHOLANGIOGRAM;  Surgeon: Aviva Signs,  MD;  Location: [facility];  Service: General;  Laterality: N/A;  . COLONOSCOPY  2009   normal colon without evidence of polyps  . ESOPHAGOGASTRODUODENOSCOPY  2009   normal stomach,esophagues without mass,reosion  . ESOPHAGOGASTRODUODENOSCOPY  09/17/2011   SLF: Mild gastritis/NO SOURCE FOR IRON DEFICINECY ANEMIA IDENTIFIED  . ESOPHAGOGASTRODUODENOSCOPY N/A 12/02/2012   SLF: 1. No source for dyspepsia identified 2. MILD Non-erosive gastritis  . GIVENS CAPSULE STUDY  09/26/2011   Procedure: GIVENS CAPSULE STUDY;  Surgeon: Dorothyann Peng, MD;  Location: [facility] SUITE;  Service: Endoscopy;  Laterality: N/A;  . ILEOColonoscopy  09/17/2011   SLF: Nl colonoscopy WMO/INTERNA; HEMORRHOIDS/ NO SOURCE FOR IRON DEFICIENCY ANEMA IDENTIFIED  . LAPAROSCOPIC APPENDECTOMY N/A 06/25/2018   Procedure: APPENDECTOMY LAPAROSCOPIC;  Surgeon: Aviva Signs, MD;  Location: [facility];  Service: General;  Laterality: N/A;     SOCIAL HISTORY:  Social History   Socioeconomic History  . Marital status: Married    Spouse name: Not on file  . Number of children: Not on file  . Years of education: Not on file  . Highest education level: Not on file  Occupational History  . Not on file  Social Needs  . Financial resource strain: Not on file  . Food insecurity:    Worry: Not on file    Inability: Not on file  . Transportation needs:    Medical: Not on file    Non-medical: Not on file  Tobacco Use  . Smoking status: Never Smoker  . Smokeless tobacco: Never Used  Substance and Sexual Activity  . Alcohol use: No  . Drug use: No  .  Sexual activity: Not on file  Lifestyle  . Physical activity:    Days per week: Not on file    Minutes per session: Not on file  . Stress: Not on file  Relationships  . Social connections:    Talks on phone: Not on file    Gets together: Not on file    Attends religious service: Not on file    Active member of club or organization: Not on file    Attends meetings of clubs or  organizations: Not on file    Relationship status: Not on file  . Intimate partner violence:    Fear of current or ex partner: Not on file    Emotionally abused: Not on file    Physically abused: Not on file    Forced sexual activity: Not on file  Other Topics Concern  . Not on file  Social History Narrative  . Not on file    FAMILY HISTORY:  Family History  Problem Relation Age of Onset  . Anesthesia problems Neg Hx   . Hypotension Neg Hx   . Malignant hyperthermia Neg Hx   . Pseudochol deficiency Neg Hx     CURRENT MEDICATIONS:  Outpatient Encounter Medications as of 10/28/2018  Medication Sig  . atorvastatin (LIPITOR) 20 MG tablet Take 20 mg by mouth at bedtime.   . Calcium Carbonate-Vitamin D (CALCIUM 600+D) 600-400 MG-UNIT tablet Take 2 tablets by mouth 2 (two) times daily.  Marland Kitchen docusate sodium (COLACE) 100 MG capsule Take 200 mg by mouth at bedtime as needed for mild constipation.   . enalapril (VASOTEC) 10 MG tablet Take 10 mg by mouth daily.    Marland Kitchen esomeprazole (NEXIUM) 40 MG capsule Take 40 mg by mouth daily before breakfast.  . letrozole (FEMARA) 2.5 MG tablet Take 1 tablet (2.5 mg total) by mouth daily.  . valACYclovir (VALTREX) 500 MG tablet Take 500 mg by mouth as needed (for outbreaks).   . [DISCONTINUED] imipramine (TOFRANIL) 25 MG tablet Take 1 tablet (25 mg total) by mouth at bedtime. (Patient not taking: Reported on 10/28/2018)   No facility-administered encounter medications on file as of 10/28/2018.     ALLERGIES:  Allergies  Allergen Reactions  . Sulfa Antibiotics Itching and Rash  . Tape Rash    Surgical tape causes blisters and rash     PHYSICAL EXAM:  ECOG Performance status: 1  Vitals:   10/28/18 0810  BP: 126/84  Pulse: 77  Resp: 16  Temp: 97.7 F (36.5 C)  SpO2: 100%   Filed Weights   10/28/18 0810  Weight: 132 lb 14.4 oz (60.3 kg)    Physical Exam Constitutional:      Appearance: Normal appearance. She is normal weight.    Abdominal:     General: Abdomen is flat.     Palpations: Abdomen is soft.  Musculoskeletal: Normal range of motion.  Skin:    General: Skin is warm and dry.  Neurological:     Mental Status: She is alert and oriented to person, place, and time. Mental status is at baseline.  Psychiatric:        Mood and Affect: Mood normal.        Behavior: Behavior normal.        Thought Content: Thought content normal.        Judgment: Judgment normal.   Breast: LEFT: No palpable masses or nipple discharge. Enlarged breast due to radiation changes denies pain  RIGHT: No palpable masses, no skin changes  or nipple discharge, no adenopathy.   LABORATORY DATA:  I have reviewed the labs as listed.  CBC    Component Value Date/Time   WBC 5.3 10/02/2018 0829   RBC 4.57 10/02/2018 0829   HGB 12.5 10/02/2018 0829   HGB 13.5 06/19/2006 1454   HCT 40.5 10/02/2018 0829   HCT 39.1 06/19/2006 1454   PLT 212 10/02/2018 0829   PLT 193 06/19/2006 1454   MCV 88.6 10/02/2018 0829   MCV 91.8 06/19/2006 1454   MCH 27.4 10/02/2018 0829   MCHC 30.9 10/02/2018 0829   RDW 13.3 10/02/2018 0829   RDW 12.9 06/19/2006 1454   LYMPHSABS 1.5 10/02/2018 0829   LYMPHSABS 1.0 06/19/2006 1454   MONOABS 0.3 10/02/2018 0829   MONOABS 0.3 06/19/2006 1454   EOSABS 0.0 10/02/2018 0829   EOSABS 0.0 06/19/2006 1454   BASOSABS 0.0 10/02/2018 0829   BASOSABS 0.0 06/19/2006 1454   CMP Latest Ref Rng & Units 10/02/2018 06/25/2018 09/26/2017  Glucose 70 - 99 mg/dL 93 105(H) 93  BUN 8 - 23 mg/dL 16 7(L) 10  Creatinine 0.44 - 1.00 mg/dL 0.65 0.58 0.81  Sodium 135 - 145 mmol/L 142 126(L) 138  Potassium 3.5 - 5.1 mmol/L 4.3 4.1 3.9  Chloride 98 - 111 mmol/L 109 92(L) 101  CO2 22 - 32 mmol/L '27 25 26  '$ Calcium 8.9 - 10.3 mg/dL 9.7 9.1 9.8  Total Protein 6.5 - 8.1 g/dL 7.8 8.3(H) 8.0  Total Bilirubin 0.3 - 1.2 mg/dL 0.5 1.5(H) 0.7  Alkaline Phos 38 - 126 U/L 58 64 59  AST 15 - 41 U/L '23 25 27  '$ ALT 0 - 44 U/L '15 16 20        '$ DIAGNOSTIC IMAGING:  I have independently reviewed the scans and discussed with the patient.   I have reviewed Francene Finders, NP's note and agree with the documentation.  I personally performed a face-to-face visit, made revisions and my assessment and plan is as follows.    ASSESSMENT & PLAN:   Hx Breast cancer, IDC, Left, Stage II, receptor+, Her2 - 1.  Stage II ([id number]) left breast IDC: -Status post lumpectomy on 03/23/2005, 1.7 cm IDC, 2/9 lymph nodes positive, Ki-67 52%, PT1CPN1A, ER/PR positive, HER-2 negative. - Status post 6 cycles of TAC chemotherapy, completed radiation therapy. - Completed 5 years of tamoxifen in 2012.  She was then switched to Femara which she could not tolerate. -BCI testing demonstrated high risk of late recurrence.  Hence she was started back on Femara in 2014. - She is still continuing Femara at this time.  She may discontinue it after she finishes the current bottle. -Right breast biopsy on 03/02/2015 shows fibrocystic changes. - Last mammogram was on 03/20/2018, BI-RADS Category 1. - Today's examination shows from left breast with postsurgical changes.  There is skin changes consistent with radiation in the inframammary area.  She reports itching in that area.  She was instructed to apply Benadryl cream. -She was given the option of follow-up visit at our clinic.  She would want to follow-up with Dr. Vickey Sages office.  I will be glad to see her on an as-needed basis.  She will continue yearly mammograms.  She was told to stop Femara.  2.  Osteopenia: - DEXA scan on 08/25/2018 shows T score of -2. -She is taking calcium and vitamin D supplements.  She is also exercising on regular basis.      Orders placed this encounter:  No orders of the defined types  were placed in this encounter.     Derek Jack, MD [place] [phone]

## 2018-10-28 NOTE — Patient Instructions (Signed)
[facility]  Discharge Instructions:  You saw Dr. Katragadda today. _______________________________________________________________  Thank you for choosing [facility] to provide your oncology and hematology care.  To afford each patient quality time with our providers, please arrive at least 15 minutes before your scheduled appointment.  You need to re-schedule your appointment if you arrive 10 or more minutes late.  We strive to give you quality time with our providers, and arriving late affects you and other patients whose appointments are after yours.  Also, if you no show three or more times for appointments you may be dismissed from the clinic.  Again, thank you for choosing [facility]. Our hope is that these requests will allow you access to exceptional care and in a timely manner. _______________________________________________________________  If you have questions after your visit, please contact our office at [phone] between the hours of 8:30 a.m. and 5:00 p.m. Voicemails left after 4:30 p.m. will not be returned until the following business day. _______________________________________________________________  For prescription refill requests, have your pharmacy contact our office. _______________________________________________________________  Recommendations made by the consultant and any test results will be sent to your referring physician. _______________________________________________________________ 

## 2018-10-28 NOTE — Assessment & Plan Note (Addendum)
1.  Stage II ([id number]) left breast IDC: -Status post lumpectomy on 03/23/2005, 1.7 cm IDC, 2/9 lymph nodes positive, Ki-67 52%, PT1CPN1A, ER/PR positive, HER-2 negative. - Status post 6 cycles of TAC chemotherapy, completed radiation therapy. - Completed 5 years of tamoxifen in 2012.  She was then switched to Femara which she could not tolerate. -BCI testing demonstrated high risk of late recurrence.  Hence she was started back on Femara in 2014. - She is still continuing Femara at this time.  She may discontinue it after she finishes the current bottle. -Right breast biopsy on 03/02/2015 shows fibrocystic changes. - Last mammogram was on 03/20/2018, BI-RADS Category 1. - Today's examination shows from left breast with postsurgical changes.  There is skin changes consistent with radiation in the inframammary area.  She reports itching in that area.  She was instructed to apply Benadryl cream. -She was given the option of follow-up visit at our clinic.  She would want to follow-up with Dr. Vickey Sages office.  I will be glad to see her on an as-needed basis.  She will continue yearly mammograms.  She was told to stop Femara.  2.  Osteopenia: - DEXA scan on 08/25/2018 shows T score of -2. -She is taking calcium and vitamin D supplements.  She is also exercising on regular basis.

## 2019-01-27 DIAGNOSIS — C50919 Malignant neoplasm of unspecified site of unspecified female breast: Secondary | ICD-10-CM | POA: Diagnosis not present

## 2019-01-27 DIAGNOSIS — N952 Postmenopausal atrophic vaginitis: Secondary | ICD-10-CM | POA: Diagnosis not present

## 2019-01-27 DIAGNOSIS — Z6821 Body mass index (BMI) 21.0-21.9, adult: Secondary | ICD-10-CM | POA: Diagnosis not present

## 2019-01-27 DIAGNOSIS — A609 Anogenital herpesviral infection, unspecified: Secondary | ICD-10-CM | POA: Diagnosis not present

## 2019-01-27 DIAGNOSIS — N9089 Other specified noninflammatory disorders of vulva and perineum: Secondary | ICD-10-CM | POA: Diagnosis not present

## 2019-02-24 ENCOUNTER — Other Ambulatory Visit: Payer: Self-pay

## 2019-02-24 ENCOUNTER — Other Ambulatory Visit (HOSPITAL_COMMUNITY)
Admission: RE | Admit: 2019-02-24 | Discharge: 2019-02-24 | Disposition: A | Discharge status: Home / Self Care | Payer: PPO | Source: Ambulatory Visit | Attending: Family Medicine | Admitting: Family Medicine

## 2019-02-24 DIAGNOSIS — D510 Vitamin B12 deficiency anemia due to intrinsic factor deficiency: Secondary | ICD-10-CM | POA: Insufficient documentation

## 2019-02-24 LAB — VITAMIN B12: Vitamin B-12: 1420 pg/mL — ABNORMAL HIGH (ref 180–914)

## 2019-03-12 DIAGNOSIS — L308 Other specified dermatitis: Secondary | ICD-10-CM | POA: Diagnosis not present

## 2019-03-12 DIAGNOSIS — L292 Pruritus vulvae: Secondary | ICD-10-CM | POA: Diagnosis not present

## 2019-04-15 ENCOUNTER — Other Ambulatory Visit (HOSPITAL_COMMUNITY): Payer: Self-pay | Admitting: Family Medicine

## 2019-04-15 DIAGNOSIS — Z1231 Encounter for screening mammogram for malignant neoplasm of breast: Secondary | ICD-10-CM

## 2019-04-20 DIAGNOSIS — K1329 Other disturbances of oral epithelium, including tongue: Secondary | ICD-10-CM | POA: Diagnosis not present

## 2019-04-27 ENCOUNTER — Ambulatory Visit (HOSPITAL_COMMUNITY)
Admission: RE | Admit: 2019-04-27 | Discharge: 2019-04-27 | Disposition: A | Discharge status: Home / Self Care | Payer: PPO | Source: Ambulatory Visit | Attending: Family Medicine | Admitting: Family Medicine

## 2019-04-27 ENCOUNTER — Other Ambulatory Visit: Payer: Self-pay

## 2019-04-27 DIAGNOSIS — Z1231 Encounter for screening mammogram for malignant neoplasm of breast: Secondary | ICD-10-CM | POA: Insufficient documentation

## 2019-05-05 ENCOUNTER — Other Ambulatory Visit: Payer: Self-pay

## 2019-05-05 DIAGNOSIS — R6889 Other general symptoms and signs: Secondary | ICD-10-CM | POA: Diagnosis not present

## 2019-05-05 DIAGNOSIS — Z20822 Contact with and (suspected) exposure to covid-19: Secondary | ICD-10-CM

## 2019-05-07 LAB — NOVEL CORONAVIRUS, NAA: SARS-CoV-2, NAA: NOT DETECTED

## 2019-06-05 DIAGNOSIS — K2100 Gastro-esophageal reflux disease with esophagitis, without bleeding: Secondary | ICD-10-CM | POA: Diagnosis not present

## 2019-10-07 ENCOUNTER — Other Ambulatory Visit (HOSPITAL_COMMUNITY)
Admission: RE | Admit: 2019-10-07 | Discharge: 2019-10-07 | Disposition: A | Discharge status: Home / Self Care | Payer: PPO | Source: Ambulatory Visit | Attending: Family Medicine | Admitting: Family Medicine

## 2019-10-07 DIAGNOSIS — I10 Essential (primary) hypertension: Secondary | ICD-10-CM | POA: Insufficient documentation

## 2019-10-07 DIAGNOSIS — D509 Iron deficiency anemia, unspecified: Secondary | ICD-10-CM | POA: Diagnosis not present

## 2019-10-07 DIAGNOSIS — Z8711 Personal history of peptic ulcer disease: Secondary | ICD-10-CM | POA: Diagnosis not present

## 2019-10-07 DIAGNOSIS — E7800 Pure hypercholesterolemia, unspecified: Secondary | ICD-10-CM | POA: Insufficient documentation

## 2019-10-07 LAB — COMPREHENSIVE METABOLIC PANEL
ALT: 13 U/L (ref 0–44)
AST: 21 U/L (ref 15–41)
Albumin: 4.5 g/dL (ref 3.5–5.0)
Alkaline Phosphatase: 65 U/L (ref 38–126)
Anion gap: 9 (ref 5–15)
BUN: 15 mg/dL (ref 8–23)
CO2: 27 mmol/L (ref 22–32)
Calcium: 9.7 mg/dL (ref 8.9–10.3)
Chloride: 102 mmol/L (ref 98–111)
Creatinine, Ser: 0.81 mg/dL (ref 0.44–1.00)
GFR calc Af Amer: >60 mL/min (ref >60)
GFR calc non Af Amer: >60 mL/min (ref >60)
Glucose, Bld: 90 mg/dL (ref 70–99)
Potassium: 4.7 mmol/L (ref 3.5–5.1)
Sodium: 138 mmol/L (ref 135–145)
Total Bilirubin: 0.8 mg/dL (ref 0.3–1.2)
Total Protein: 7.9 g/dL (ref 6.5–8.1)

## 2019-10-07 LAB — CBC WITH DIFFERENTIAL/PLATELET
Abs Immature Granulocytes: 0.01 10*3/uL (ref 0.00–0.07)
Basophils Absolute: 0.1 10*3/uL (ref 0.0–0.1)
Basophils Relative: 1 %
Eosinophils Absolute: 0 10*3/uL (ref 0.0–0.5)
Eosinophils Relative: 1 %
HCT: 39.9 % (ref 36.0–46.0)
Hemoglobin: 12.3 g/dL (ref 12.0–15.0)
Immature Granulocytes: 0 %
Lymphocytes Relative: 30 %
Lymphs Abs: 1.6 10*3/uL (ref 0.7–4.0)
MCH: 27.6 pg (ref 26.0–34.0)
MCHC: 30.8 g/dL (ref 30.0–36.0)
MCV: 89.7 fL (ref 80.0–100.0)
Monocytes Absolute: 0.4 10*3/uL (ref 0.1–1.0)
Monocytes Relative: 7 %
Neutro Abs: 3.2 10*3/uL (ref 1.7–7.7)
Neutrophils Relative %: 61 %
Platelets: 242 10*3/uL (ref 150–400)
RBC: 4.45 MIL/uL (ref 3.87–5.11)
RDW: 14.4 % (ref 11.5–15.5)
WBC: 5.3 10*3/uL (ref 4.0–10.5)
nRBC: 0 % (ref 0.0–0.2)

## 2019-10-07 LAB — LIPID PANEL
Cholesterol: 178 mg/dL (ref 0–200)
HDL: 82 mg/dL (ref >40)
LDL Cholesterol: 81 mg/dL (ref 0–99)
Total CHOL/HDL Ratio: 2.2 RATIO
Triglycerides: 75 mg/dL (ref <150)
VLDL: 15 mg/dL (ref 0–40)

## 2019-10-07 LAB — TSH: TSH: 2.113 u[IU]/mL (ref 0.350–4.500)

## 2019-10-07 LAB — VITAMIN B12: Vitamin B-12: 1810 pg/mL — ABNORMAL HIGH (ref 180–914)

## 2019-10-08 LAB — T4: T4, Total: 9.8 ug/dL (ref 4.5–12.0)

## 2019-10-08 LAB — T3, FREE: T3, Free: 3.4 pg/mL (ref 2.0–4.4)

## 2019-10-13 DIAGNOSIS — I10 Essential (primary) hypertension: Secondary | ICD-10-CM | POA: Diagnosis not present

## 2019-10-13 DIAGNOSIS — K2100 Gastro-esophageal reflux disease with esophagitis, without bleeding: Secondary | ICD-10-CM | POA: Diagnosis not present

## 2019-10-13 DIAGNOSIS — Z7189 Other specified counseling: Secondary | ICD-10-CM | POA: Diagnosis not present

## 2019-10-13 DIAGNOSIS — K146 Glossodynia: Secondary | ICD-10-CM | POA: Diagnosis not present

## 2019-10-14 IMAGING — CT CT ABD-PELV W/O CM
2 of 4 series · 16 of 46 positions shown, 18 images · non-contrast
Comparison: CT scan of August 11, 2008.

CLINICAL DATA: Acute upper abdominal pain.

EXAM:
CT ABDOMEN AND PELVIS WITHOUT CONTRAST
TECHNIQUE: Multidetector CT imaging of the abdomen and pelvis was performed
following the standard protocol without IV contrast.

[Series 2: axial st · axial · 0.76mm/px · z∈[-360,+15]mm · 13 of 83 slices shown, 15 images]
[im 4/83  soft-tissue]
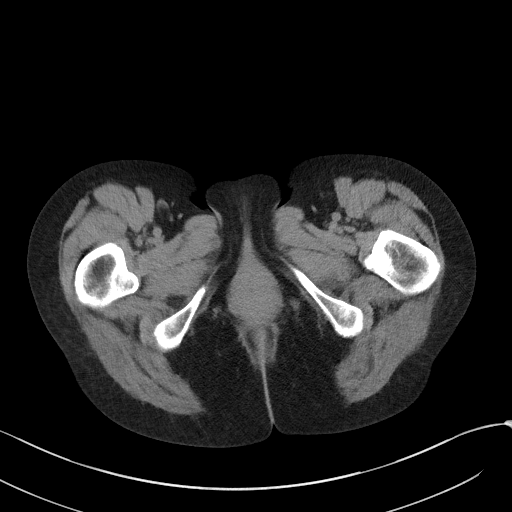
[im 4/83  bone]
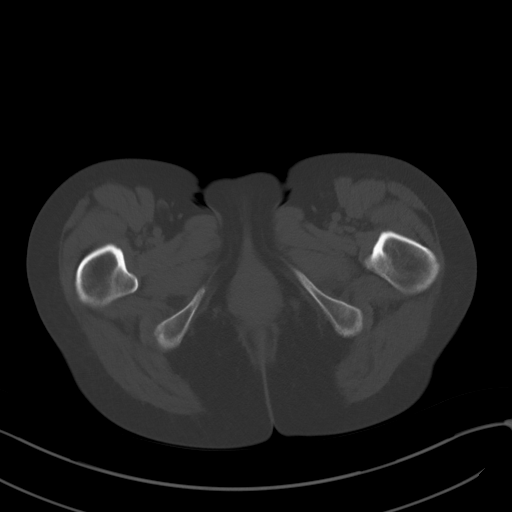
[im 11/83  soft-tissue]
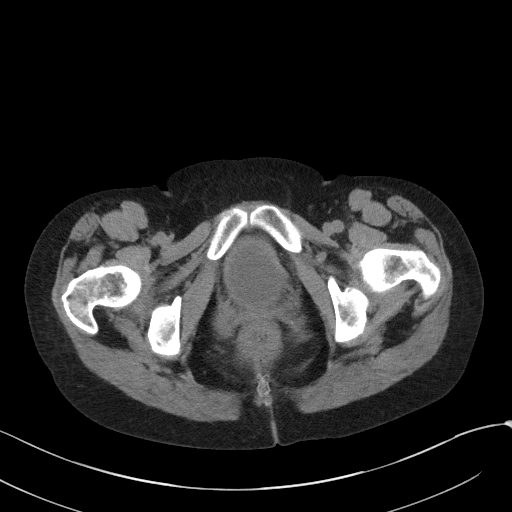
[im 18/83  soft-tissue]
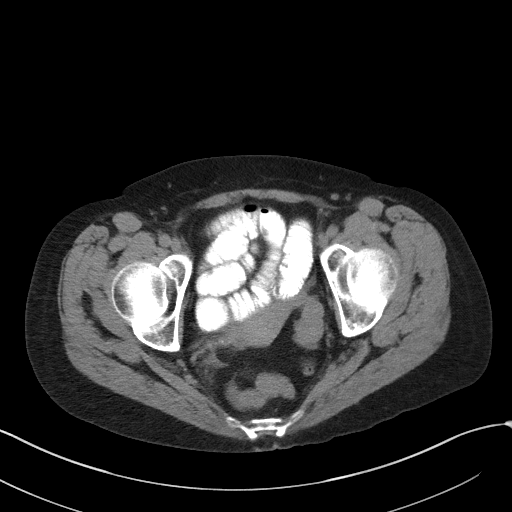
[im 24/83  soft-tissue]
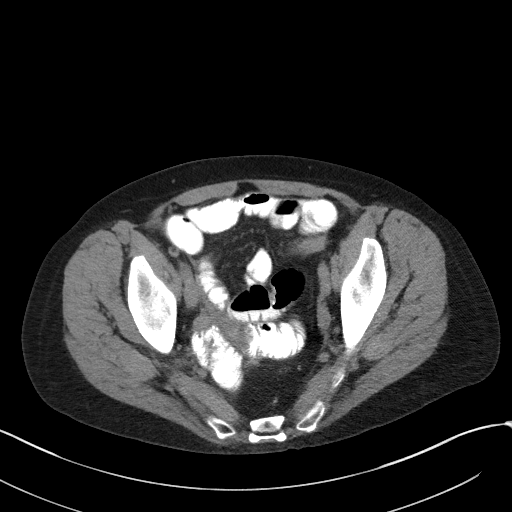
[im 28/83  soft-tissue]
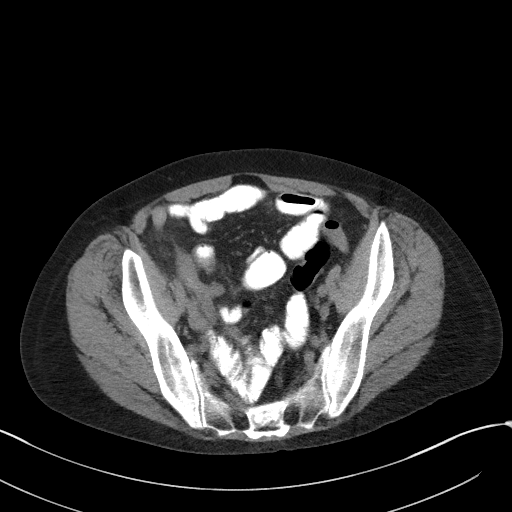
[im 35/83  soft-tissue]
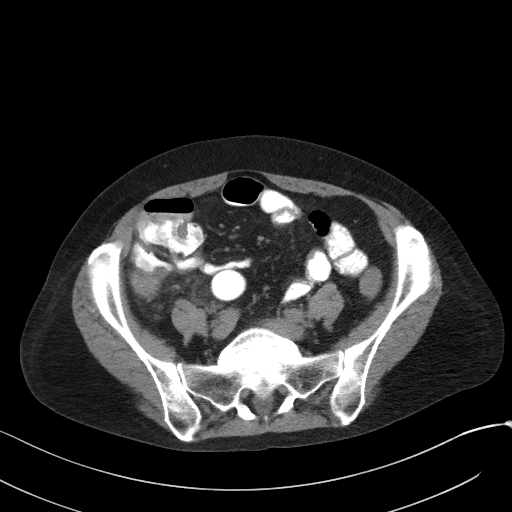
[im 42/83  soft-tissue]
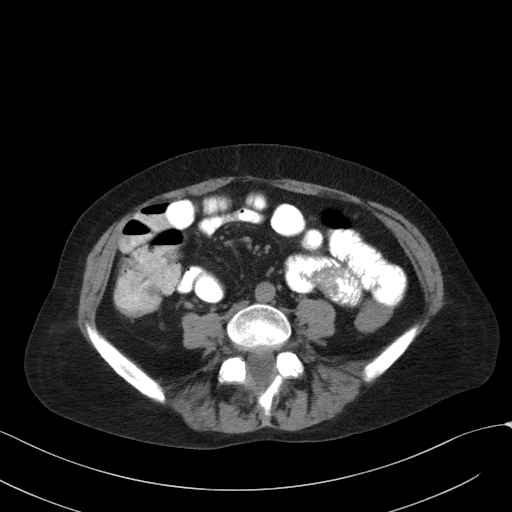
[im 48/83  soft-tissue]
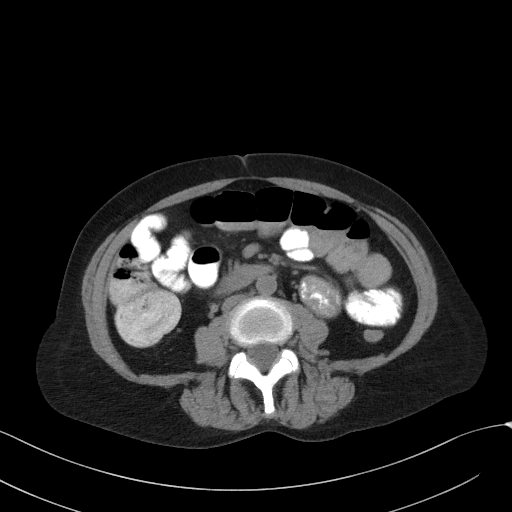
[im 55/83  soft-tissue]
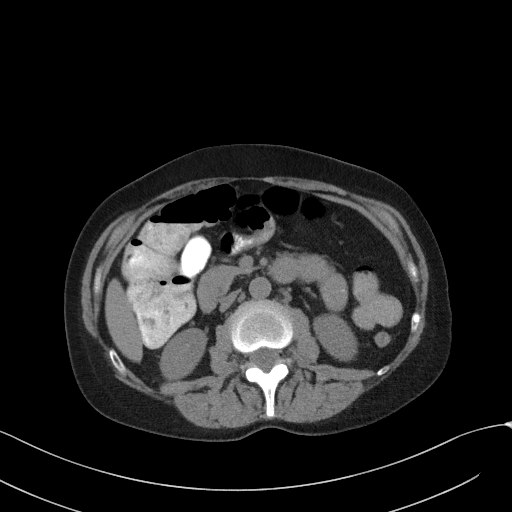
[im 55/83  bone]
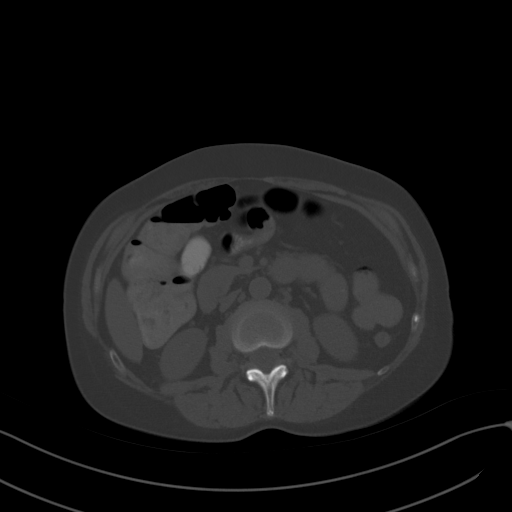
[im 59/83  soft-tissue]
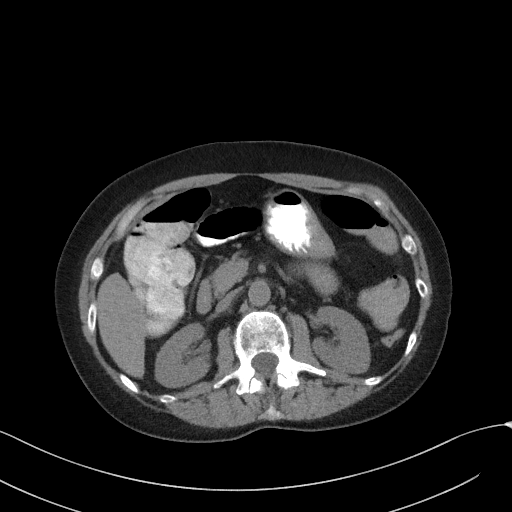
[im 65/83  soft-tissue]
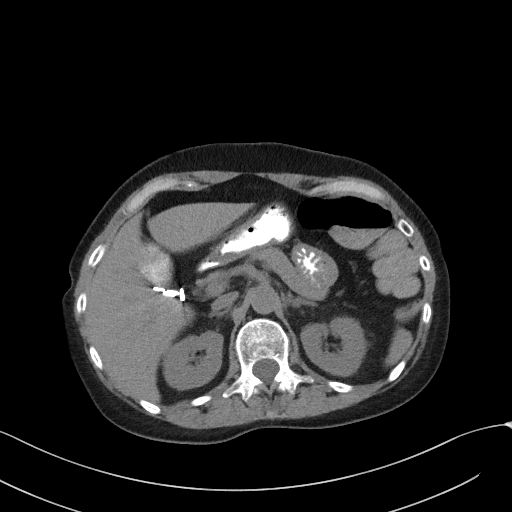
[im 72/83  soft-tissue]
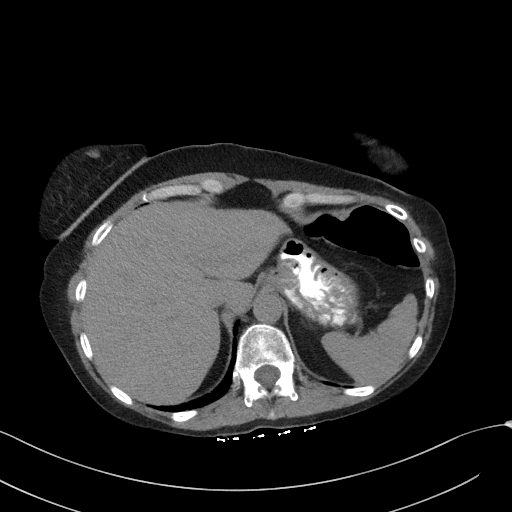
[im 79/83  soft-tissue]
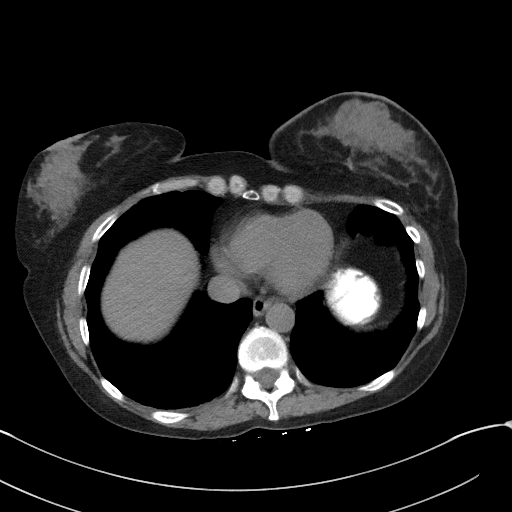

[Series 5: coronal st · coronal · 0.65mm/px · 3 of 79 slices shown]
[im 27/79  soft-tissue]
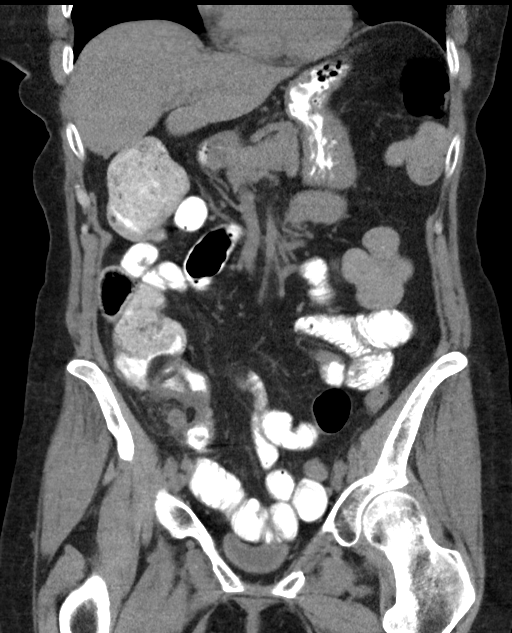
[im 35/79  soft-tissue]
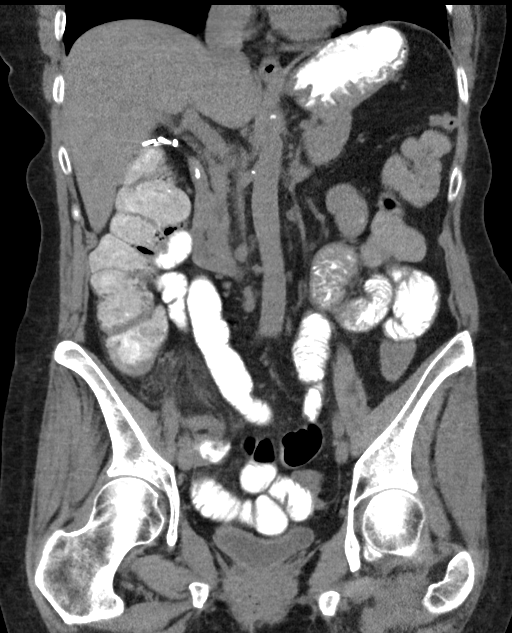
[im 44/79  soft-tissue]
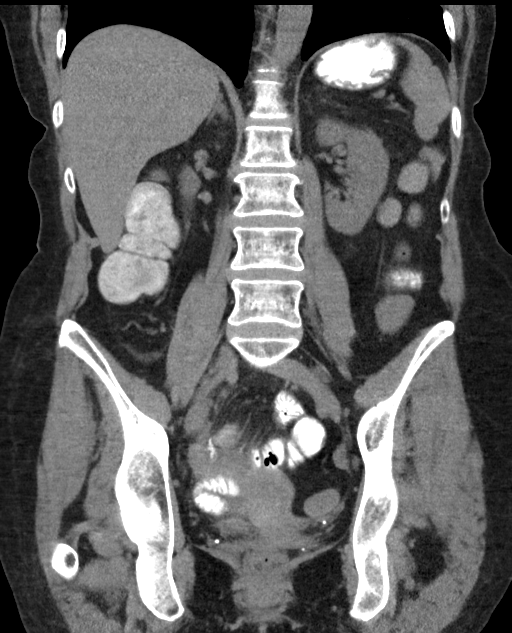

[16 of 46 positions shown; findings below may reference images not displayed]

FINDINGS: Lower chest: No acute abnormality.

Hepatobiliary: No focal liver abnormality is seen. Status post
cholecystectomy. No biliary dilatation.

Pancreas: Unremarkable. No pancreatic ductal dilatation or
surrounding inflammatory changes.

Spleen: Normal in size without focal abnormality.

Adrenals/Urinary Tract: Adrenal glands are unremarkable. Kidneys are
normal, without renal calculi, focal lesion, or hydronephrosis.
Bladder is unremarkable.

Stomach/Bowel: The stomach appears normal. There is no evidence of
bowel obstruction. The appendix is enlarged with surrounding
inflammation consistent with appendicitis.

Appendix: Location: Right lower quadrant.

Diameter: 12 mm.

Appendicolith: No.

Mucosal hyper-enhancement: No.

Extraluminal gas: No.

Periappendiceal collection: No.

Vascular/Lymphatic: No significant vascular findings are present. No
enlarged abdominal or pelvic lymph nodes.

Reproductive: Uterus and bilateral adnexa are unremarkable.

Other: No abdominal wall hernia or abnormality. No abdominopelvic
ascites.

Musculoskeletal: Grade 1 anterolisthesis of L5-S1 is noted secondary
to bilateral L5 pars defects.
IMPRESSION: Findings consistent with acute appendicitis. No abscess is noted.
These results will be called to the ordering clinician or
representative by the Radiologist Assistant, and communication
documented in the PACS or zVision Dashboard.

## 2019-11-30 DIAGNOSIS — D2362 Other benign neoplasm of skin of left upper limb, including shoulder: Secondary | ICD-10-CM | POA: Diagnosis not present

## 2019-11-30 DIAGNOSIS — L821 Other seborrheic keratosis: Secondary | ICD-10-CM | POA: Diagnosis not present

## 2019-11-30 DIAGNOSIS — L0501 Pilonidal cyst with abscess: Secondary | ICD-10-CM | POA: Diagnosis not present

## 2019-12-01 ENCOUNTER — Other Ambulatory Visit: Payer: Self-pay

## 2019-12-01 ENCOUNTER — Encounter: Payer: Self-pay | Admitting: Gastroenterology

## 2019-12-01 ENCOUNTER — Ambulatory Visit (INDEPENDENT_AMBULATORY_CARE_PROVIDER_SITE_OTHER): Payer: PPO | Admitting: Gastroenterology

## 2019-12-01 VITALS — BP 145/82 | HR 99 | Temp 97.3°F | Ht 64.0 in | Wt 130.8 lb

## 2019-12-01 DIAGNOSIS — R0989 Other specified symptoms and signs involving the circulatory and respiratory systems: Secondary | ICD-10-CM

## 2019-12-01 DIAGNOSIS — K219 Gastro-esophageal reflux disease without esophagitis: Secondary | ICD-10-CM | POA: Diagnosis not present

## 2019-12-01 MED ORDER — FAMOTIDINE 40 MG PO TABS: 40.0000 mg | ORAL_TABLET | Freq: Every day | ORAL | 3 refills | Status: DC

## 2019-12-01 NOTE — Patient Instructions (Signed)
Continue omeprazole (Prilosec) but make sure to take it 30 minutes before breakfast and dinner on an empty stomach.   You can take pepcid at night before bed. I sent to pharmacy, but it is available over the counter as well.  Let us know in about 2 weeks how you are doing with the omeprazole. We may need to change this.  Let us know when you are able to pursue the upper endoscopy!  We will see you again in 3-4 months!  It was a pleasure to see you today. I want to create trusting relationships with patients to provide genuine, compassionate, and quality care. I value your feedback. If you receive a survey regarding your visit,  I greatly appreciate you taking time to fill this out.   Annitta Needs, PhD, ANP-BC [facility]

## 2019-12-01 NOTE — Progress Notes (Signed)
Referring Provider: Lemmie Evens, MD Primary Care Physician:  Lemmie Evens, MD Primary GI: Dr. Oneida Alar   Chief Complaint  Patient presents with  . Gastroesophageal Reflux    lots of throat clearing, throat feels "stopped up", has had few episodes food comes back up, has "slimy" feeling in back of throat    HPI:   [name] [name] is a 69 y.o. female presenting today with a history of gastritis, dyspepsia, GERD,   Last seen in Aug 2017. Used to be on BID dosing PPI (omeprazole) but has cut down to once daily. Last fall feeling like something always stuck in throat. Saw PCP and recommended to increase to BID to see how she felt. Still with throat clearing, worse at night after eating. Has bitter taste in back of throat. Feels slimy feeling in back of throat. Unable to pinpoint any episodes of dysphagia. Has persistent globus sensation. Had been on Prevacid but felt it was constipating her. Now back on omeprazole BID. Not taking evening dose before dinner. Stays sitting up for several hours after eating. Uses a lot of Tums and Maalox. No vocal changes or hoarseness.   Dental work a few years ago, with left side of tongue sore. Saw oral surgeon and being followed. May be dealing with uneven bite. Needs bottom left wisdom tooth removed with biopsy of tongue. Dentist visit in next few weeks. Wants to hold off on any endoscopic evaluation until after this is completed.   Gets hunger feeling in stomach at times but is not hungry. Occurs at night. Cut out sodas. Very small amount of coffee, diluting with hot water. No chocolate.   Past Medical History:  Diagnosis Date  . Breast cancer ([place])   . Functional dyspepsia MAR 2014   APR 2014: PT DECLINED BRAVO. PPIs-PROTOIX/PRILOSEC-->DEXILANT-->NEXIUM(TOO EXPENSIVE)  . GERD (gastroesophageal reflux disease)   . High cholesterol   . Hypertension     Past Surgical History:  Procedure Laterality Date  . BREAST LUMPECTOMY  2006   Left  breast  .  BREAST LUMPECTOMY  1994   Right breast-benign  . CHOLECYSTECTOMY N/A 05/10/2016   Procedure: LAPAROSCOPIC CHOLECYSTECTOMY WITH INTRAOPERATIVE CHOLANGIOGRAM;  Surgeon: Aviva Signs, MD;  Location: [facility];  Service: General;  Laterality: N/A;  . COLONOSCOPY  2009   normal colon without evidence of polyps  . ESOPHAGOGASTRODUODENOSCOPY  2009   normal stomach,esophagues without mass,reosion  . ESOPHAGOGASTRODUODENOSCOPY  09/17/2011   SLF: Mild gastritis/NO SOURCE FOR IRON DEFICINECY ANEMIA IDENTIFIED  . ESOPHAGOGASTRODUODENOSCOPY N/A 12/02/2012   SLF: 1. No source for dyspepsia identified 2. MILD Non-erosive gastritis  . GIVENS CAPSULE STUDY  09/26/2011   Procedure: GIVENS CAPSULE STUDY;  Surgeon: Dorothyann Peng, MD;  Location: [facility];  Service: Endoscopy;  Laterality: N/A;  . ILEOColonoscopy  09/17/2011   SLF: Nl colonoscopy WMO/INTERNA; HEMORRHOIDS/ NO SOURCE FOR IRON DEFICIENCY ANEMA IDENTIFIED  . LAPAROSCOPIC APPENDECTOMY N/A 06/25/2018   Procedure: APPENDECTOMY LAPAROSCOPIC;  Surgeon: Aviva Signs, MD;  Location: [facility];  Service: General;  Laterality: N/A;    Current Outpatient Medications  Medication Sig Dispense Refill  . atorvastatin (LIPITOR) 20 MG tablet Take 20 mg by mouth at bedtime.   0  . Calcium Carbonate-Vitamin D (CALCIUM 600+D) 600-400 MG-UNIT tablet Take 2 tablets by mouth 2 (two) times daily.    Marland Kitchen docusate sodium (COLACE) 100 MG capsule Take 200 mg by mouth at bedtime as needed for mild constipation.     . enalapril (VASOTEC) 10 MG tablet  Take 10 mg by mouth daily.      Marland Kitchen omeprazole (PRILOSEC) 40 MG capsule Take 40 mg by mouth in the morning and at bedtime.    . valACYclovir (VALTREX) 500 MG tablet Take 500 mg by mouth as needed (for outbreaks).   12   No current facility-administered medications for this visit.    Allergies as of 12/01/2019 - Review Complete 12/01/2019  Allergen Reaction Noted  . Sulfa antibiotics Itching and Rash 05/09/2016  . Tape Rash  05/10/2016    Family History  Problem Relation Age of Onset  . Colon cancer Maternal Aunt   . Anesthesia problems Neg Hx   . Hypotension Neg Hx   . Malignant hyperthermia Neg Hx   . Pseudochol deficiency Neg Hx   . Colon polyps Neg Hx     Social History   Socioeconomic History  . Marital status: Married    Spouse name: Not on file  . Number of children: Not on file  . Years of education: Not on file  . Highest education level: Not on file  Occupational History  . Not on file  Tobacco Use  . Smoking status: Never Smoker  . Smokeless tobacco: Never Used  Substance and Sexual Activity  . Alcohol use: No  . Drug use: No  . Sexual activity: Not on file  Other Topics Concern  . Not on file  Social History Narrative  . Not on file   Social Determinants of Health   Financial Resource Strain:   . Difficulty of Paying Living Expenses:   Food Insecurity:   . Worried About Charity fundraiser in the Last Year:   . Arboriculturist in the Last Year:   Transportation Needs:   . Film/video editor (Medical):   Marland Kitchen Lack of Transportation (Non-Medical):   Physical Activity:   . Days of Exercise per Week:   . Minutes of Exercise per Session:   Stress:   . Feeling of Stress :   Social Connections:   . Frequency of Communication with Friends and Family:   . Frequency of Social Gatherings with Friends and Family:   . Attends Religious Services:   . Active Member of Clubs or Organizations:   . Attends Archivist Meetings:   Marland Kitchen Marital Status:     Review of Systems: Gen: Denies fever, chills, anorexia. Denies fatigue, weakness, weight loss.  CV: Denies chest pain, palpitations, syncope, peripheral edema, and claudication. Resp: Denies dyspnea at rest, cough, wheezing, coughing up blood, and pleurisy. GI: see HPI Derm: Denies rash, itching, dry skin Psych: Denies depression, anxiety, memory loss, confusion. No homicidal or suicidal ideation.  Heme: Denies bruising,  bleeding, and enlarged lymph nodes.  Physical Exam: BP (!) 145/82   Pulse 99   Temp (!) 97.3 F (36.3 C) (Oral)   Ht 5\' 4"  (1.626 m)   Wt 130 lb 12.8 oz (59.3 kg)   BMI 22.45 kg/m  General:   Alert and oriented. No distress noted. Pleasant and cooperative.  Head:  Normocephalic and atraumatic. Eyes:  Conjuctiva clear without scleral icterus. Mouth:  Oral mucosa pink and moist. Good dentition. No lesions. Abdomen:  +BS, soft, non-tender and non-distended. No rebound or guarding. No HSM or masses noted. Msk:  Symmetrical without gross deformities. Normal posture. Extremities:  Without edema. Neurologic:  Alert and  oriented x4 Psych:  Alert and cooperative. Normal mood and affect.  ASSESSMENT: ETTER CLAYTOR is a 69 y.o. female with history  of chronic GERD now worsening, with globus sensation and throat clearing.  Previously has been on BID dosing of PPI; however, she decreased this to once daily on her own with recurrent globus sensation and worsening throat clearing. Increasing to BID has helped but still with persistent symptoms. Likely dealing with uncontrolled GERD and may have element of LPR. We discussed timing of taking omeprazole and adding Pepcid as needed in evenings. She has already been following strict GERD measures and behavior changes. Recommend EGD if no improvement in globus sensation. She would like to wait until after dental work is completed and will call us in 1-2 weeks.   May need to change PPI in future. For now, stay with omeprazole, making sure to take 30 minutes before breakfast and dinner.    PLAN:   Prilosec BID  Pepcid prn in evening  EGD if no improvement  Patient to call with update in 1-2 weeks  Return in 3-4 months  Annitta Needs, PhD, [facility]astro[facility]

## 2019-12-03 NOTE — Progress Notes (Signed)
Cc'ed to pcp °

## 2019-12-09 ENCOUNTER — Telehealth: Payer: Self-pay | Admitting: Emergency Medicine

## 2019-12-09 MED ORDER — PANTOPRAZOLE SODIUM 40 MG PO TBEC: 40.0000 mg | DELAYED_RELEASE_TABLET | Freq: Two times a day (BID) | ORAL | 3 refills | Status: DC

## 2019-12-09 NOTE — Telephone Encounter (Signed)
Called pt lmom 

## 2019-12-09 NOTE — Addendum Note (Signed)
Addended by: Annitta Needs on: 12/09/2019 10:56 AM   Modules accepted: Orders

## 2019-12-09 NOTE — Telephone Encounter (Signed)
Called and Spoke to Pt. she  is taking the omeprazole 40 mg twice a day. once in the morning and once at night.Marland Kitchenalong with the Pepcid only at night.

## 2019-12-09 NOTE — Telephone Encounter (Signed)
Pt called in and stated the provider asked her to call within 1-2 weeks to let her know if there is any improvement with acid reflux since taking omeprozole 40 mt daily (which she has been on for years)  and pepcid 40 mg at night. pt stated she is doing better since she was placed on the Pepcid but not a lot better, she just notices a slight improvement at night . Pt stated she doesn't have an appointment to be seen for a few months and  she didn't know if she should continue taking the  medications as they are or if the medications needed to be increased. Pt stated to please let her know what she needs to do.

## 2019-12-09 NOTE — Telephone Encounter (Signed)
She was to increase omeprazole to twice a day. Has she done this?

## 2019-12-09 NOTE — Telephone Encounter (Signed)
I sent in Protonix twice a day. Take 30 minutes before breakfast and dinner. We had discussed changing PPIs.

## 2019-12-10 NOTE — Telephone Encounter (Signed)
Pt returned phone call verified name and dob. Notified her to d/c omeprazole 40mg  twice a day  and Pepcid at night and that the provider sent in Protonix twice a day to take 30 mins before breakfast and dinner . Notified pt it was sent in to [facility] on [address]. Pt stated she understood and that she would call in about 2 weeks to let us know if the medication is working and thanked me for the call

## 2020-01-25 DIAGNOSIS — R58 Hemorrhage, not elsewhere classified: Secondary | ICD-10-CM | POA: Diagnosis not present

## 2020-01-25 DIAGNOSIS — K121 Other forms of stomatitis: Secondary | ICD-10-CM | POA: Diagnosis not present

## 2020-03-14 NOTE — Progress Notes (Addendum)
Referring Provider: Lemmie Evens, MD Primary Care Physician:  Lemmie Evens, MD Primary GI: previously Dr. Oneida Alar    Chief Complaint  Patient presents with  . Gastroesophageal Reflux    feels like something slimy in throat    HPI:   [name] [name] is a 69 y.o. female presenting today with a history of chronic GERD, recent issues with globus sensation and throat clearing. Likely was dealing with uncontrolled GERD and may have element of LPR. Last seen in March. She was on omeprazole but now has been changed to Protonix BID with Pepcid at bedtime. EGD recommended at last visit but was on hold due to dental work.   Dental work completed. Protonix BID. Stopped taking Pepcid. Doesn't have the feeling of throat clearing, globus sensation any longer. Bad taste in mouth has subsided. Has slimy feeling in throat. No heartburn. When eating, feels it in back of throat. When brushing teeth, has slimy feeling in throat. No taste. Slimy/mucus. No coughing. Noticed it more after the biopsy with dental work. No odynophagia. Saw ENT for issues with ear many years ago. Slimy on back of tongue when brushing. After eating will tell it is slimy. Worse since biopsy. Denies postnasal drainage.    Past Medical History:  Diagnosis Date  . Breast cancer ([place])   . Functional dyspepsia MAR 2014   APR 2014: PT DECLINED BRAVO. PPIs-PROTOIX/PRILOSEC-->DEXILANT-->NEXIUM(TOO EXPENSIVE)  . GERD (gastroesophageal reflux disease)   . High cholesterol   . Hypertension     Past Surgical History:  Procedure Laterality Date  . BREAST LUMPECTOMY  2006   Left  breast  . BREAST LUMPECTOMY  1994   Right breast-benign  . CHOLECYSTECTOMY N/A 05/10/2016   Procedure: LAPAROSCOPIC CHOLECYSTECTOMY WITH INTRAOPERATIVE CHOLANGIOGRAM;  Surgeon: Aviva Signs, MD;  Location: [facility];  Service: General;  Laterality: N/A;  . COLONOSCOPY  2009   normal colon without evidence of polyps  . ESOPHAGOGASTRODUODENOSCOPY  2009    normal stomach,esophagues without mass,reosion  . ESOPHAGOGASTRODUODENOSCOPY  09/17/2011   SLF: Mild gastritis/NO SOURCE FOR IRON DEFICINECY ANEMIA IDENTIFIED  . ESOPHAGOGASTRODUODENOSCOPY N/A 12/02/2012   SLF: 1. No source for dyspepsia identified 2. MILD Non-erosive gastritis  . GIVENS CAPSULE STUDY  09/26/2011   Procedure: GIVENS CAPSULE STUDY;  Surgeon: Dorothyann Peng, MD;  [facility];  Service: Endoscopy;  Laterality: N/A;  . ILEOColonoscopy  09/17/2011   SLF: Nl colonoscopy WMO/INTERNA; HEMORRHOIDS/ NO SOURCE FOR IRON DEFICIENCY ANEMA IDENTIFIED  . LAPAROSCOPIC APPENDECTOMY N/A 06/25/2018   Procedure: APPENDECTOMY LAPAROSCOPIC;  Surgeon: Aviva Signs, MD;  Location: [facility];  Service: General;  Laterality: N/A;    Current Outpatient Medications  Medication Sig Dispense Refill  . atorvastatin (LIPITOR) 20 MG tablet Take 20 mg by mouth at bedtime.   0  . Calcium Carbonate-Vitamin D (CALCIUM 600+D) 600-400 MG-UNIT tablet Take 2 tablets by mouth 2 (two) times daily.    Marland Kitchen docusate sodium (COLACE) 100 MG capsule Take 200 mg by mouth at bedtime as needed for mild constipation.     . enalapril (VASOTEC) 10 MG tablet Take 10 mg by mouth daily.      . pantoprazole (PROTONIX) 40 MG tablet Take 1 tablet (40 mg total) by mouth 2 (two) times daily before a meal. 60 tablet 3  . valACYclovir (VALTREX) 500 MG tablet Take 500 mg by mouth as needed (for outbreaks).   12   No current facility-administered medications for this visit.    Allergies as of  03/15/2020 - Review Complete 03/15/2020  Allergen Reaction Noted  . Sulfa antibiotics Itching and Rash 05/09/2016  . Tape Rash 05/10/2016    Family History  Problem Relation Age of Onset  . Colon cancer Maternal Aunt   . Anesthesia problems Neg Hx   . Hypotension Neg Hx   . Malignant hyperthermia Neg Hx   . Pseudochol deficiency Neg Hx   . Colon polyps Neg Hx     Social History   Socioeconomic History  . Marital status: Married      Spouse name: Not on file  . Number of children: Not on file  . Years of education: Not on file  . Highest education level: Not on file  Occupational History  . Not on file  Tobacco Use  . Smoking status: Never Smoker  . Smokeless tobacco: Never Used  Vaping Use  . Vaping Use: Never used  Substance and Sexual Activity  . Alcohol use: No  . Drug use: No  . Sexual activity: Not on file  Other Topics Concern  . Not on file  Social History Narrative  . Not on file   Social Determinants of Health   Financial Resource Strain:   . Difficulty of Paying Living Expenses:   Food Insecurity:   . Worried About Charity fundraiser in the Last Year:   . Arboriculturist in the Last Year:   Transportation Needs:   . Film/video editor (Medical):   Marland Kitchen Lack of Transportation (Non-Medical):   Physical Activity:   . Days of Exercise per Week:   . Minutes of Exercise per Session:   Stress:   . Feeling of Stress :   Social Connections:   . Frequency of Communication with Friends and Family:   . Frequency of Social Gatherings with Friends and Family:   . Attends Religious Services:   . Active Member of Clubs or Organizations:   . Attends Archivist Meetings:   Marland Kitchen Marital Status:     Review of Systems: Gen: Denies fever, chills, anorexia. Denies fatigue, weakness, weight loss.  CV: Denies chest pain, palpitations, syncope, peripheral edema, and claudication. Resp: Denies dyspnea at rest, cough, wheezing, coughing up blood, and pleurisy. GI: see HPI Derm: Denies rash, itching, dry skin Psych: Denies depression, anxiety, memory loss, confusion. No homicidal or suicidal ideation.  Heme: Denies bruising, bleeding, and enlarged lymph nodes.  Physical Exam: BP (!) 146/76   Pulse 87   Temp 98.2 F (36.8 C) (Oral)   Ht 5\' 5"  (1.651 m)   Wt 129 lb (58.5 kg)   BMI 21.47 kg/m  General:   Alert and oriented. No distress noted. Pleasant and cooperative.  Head:  Normocephalic  and atraumatic. Eyes:  Conjuctiva clear without scleral icterus. Mouth:  Mask in place Abdomen:  +BS, soft, non-tender and non-distended. No rebound or guarding. No HSM or masses noted. Msk:  Symmetrical without gross deformities. Normal posture. Extremities:  Without edema. Neurologic:  Alert and  oriented x4 Psych:  Alert and cooperative. Normal mood and affect.  ASSESSMENT: [name] [name] is a 69 y.o. female presenting today with history of GERD, previously with globus sensation and throat clearing although now resolved with Protonix BID. Underwent dental work and biopsy of oral mucosa in back of throat May 2021; since that time, she has noted more of a "slimy" sensation in back of throat, at times worsened by eating. Noticeable when brushing teeth.  Overall, she has had resolution of globus  sensation and no typical GERD symptoms, no dysphagia, no alarm signs/symptoms. Query if she has postnasal drainage as culprit for her "slime" sensation. I also would want to rule out any occult etiology and recommended ENT evaluation; however, she is hesitant to pursue this.  She is maximized on GERD therapy and would benefit from reducing PPI to once daily if tolerated in the future. I did discuss with her that I would research further any additional evaluations/interventions to help her symptoms, but we have exhausted GERD efforts here. Doubt EGD would be helpful especially without alarm signs/symptoms.    PLAN:  Continue PPI BID for now  GERD diet  Recommend ENT if patient is willing in future  Return in 6 months  Annitta Needs, PhD, ANP-BC [facility]   ADDENDUM 04/06/20:  Patient now with recurrent globus sensation. As previously discussed, will arrange EGD/dilation with Dr. Abbey Chatters. Continue with plans for ENT as well.  Annitta Needs, PhD, ANP-BC [facility] Gastroenterology  1:52 PM

## 2020-03-15 ENCOUNTER — Encounter: Payer: Self-pay | Admitting: Gastroenterology

## 2020-03-15 ENCOUNTER — Other Ambulatory Visit: Payer: Self-pay

## 2020-03-15 ENCOUNTER — Ambulatory Visit: Payer: PPO | Admitting: Gastroenterology

## 2020-03-15 VITALS — BP 146/76 | HR 87 | Temp 98.2°F | Ht 65.0 in | Wt 129.0 lb

## 2020-03-15 DIAGNOSIS — K219 Gastro-esophageal reflux disease without esophagitis: Secondary | ICD-10-CM

## 2020-03-15 NOTE — Patient Instructions (Signed)
Let's continue the twice a day Protonix for now. Ultimately, it would be good to get you down to just once per day.  I will do some investigating and let you know of anything else we need to do!  We will see you in 6 months, but please call if you have that recurrent sensation of something in your throat!  I enjoyed seeing you again today! As you know, I value our relationship and want to provide genuine, compassionate, and quality care. I welcome your feedback. If you receive a survey regarding your visit,  I greatly appreciate you taking time to fill this out. See you next time!  Annitta Needs, PhD, ANP-BC [facility] Gastroenterology

## 2020-03-16 NOTE — Progress Notes (Signed)
CC'ED TO PCP 

## 2020-03-29 ENCOUNTER — Telehealth: Payer: Self-pay | Admitting: *Deleted

## 2020-03-29 DIAGNOSIS — K219 Gastro-esophageal reflux disease without esophagitis: Secondary | ICD-10-CM

## 2020-03-29 NOTE — Telephone Encounter (Signed)
Called [name] [name] verified name and dob [name] [name] is agreeable to see ent. [name] [name] sees doctor teoh and would like to see him again if possible. [name] [name] is also agreeable to have EGD. Wants to know if she should see ent before procedure.

## 2020-03-29 NOTE — Telephone Encounter (Signed)
She does not need an EGD if no globus sensation. Only ENT.

## 2020-03-29 NOTE — Telephone Encounter (Signed)
Called pt. Line rang numerous times no answer and no VM 

## 2020-03-29 NOTE — Telephone Encounter (Signed)
Referral sent to ent Dr. Benjamine Mola

## 2020-03-29 NOTE — Telephone Encounter (Signed)
Yes, please let her know I was on vacation. I still stand by having her see ENT.   IF any recurrence of globus sensation (like something stuck in her throat) then I recommend EGD. Otherwise, I feel we have maximized GERD therapy and I do not feel the symptoms she is having (slime in throat) is necessarily related to GI. With her history of oral lesion removed, seeing ENT would be helpful. Please refer if she is willing.

## 2020-03-29 NOTE — Addendum Note (Signed)
Addended by: Cheron Every on: 03/29/2020 02:11 PM   Modules accepted: Orders

## 2020-03-29 NOTE — Telephone Encounter (Signed)
Patient called stating AB was going to find out some things for her and have not heard from [place]. She is following up

## 2020-03-30 NOTE — Telephone Encounter (Signed)
Pt stating she is only having trouble clearing her throat every once in a while and it isn't that bad. She will see doctor teoh but if her throat gets bad to where shes feeling like theres something in IT all the time she will give [place] a call back to see if she can do the EGD

## 2020-04-06 ENCOUNTER — Other Ambulatory Visit: Payer: Self-pay | Admitting: Gastroenterology

## 2020-04-06 NOTE — Telephone Encounter (Signed)
May arrange EGD with dilation by Dr. Abbey Chatters with Propofol. ASA II. Diagnosis: globus sensation.

## 2020-04-06 NOTE — Telephone Encounter (Signed)
Called spoke with pt. She is scheduled for procedure 8/26 at 7:30am. covid test scheduled for 8/24 at 10:30am. Patient aware will mail instructions with covid test appt. Orders entered.

## 2020-04-06 NOTE — Telephone Encounter (Addendum)
Pt called and stated she does have globus sensation again and she does want to move forward with the EGD. pt asked if she should wait until after her ENT ov on 04/15/20

## 2020-04-14 ENCOUNTER — Other Ambulatory Visit: Payer: Self-pay

## 2020-04-14 ENCOUNTER — Encounter (HOSPITAL_COMMUNITY)
Admission: RE | Admit: 2020-04-14 | Discharge: 2020-04-14 | Disposition: A | Discharge status: Home / Self Care | Payer: PPO | Source: Ambulatory Visit | Attending: Internal Medicine | Admitting: Internal Medicine

## 2020-04-15 DIAGNOSIS — R0982 Postnasal drip: Secondary | ICD-10-CM | POA: Diagnosis not present

## 2020-04-15 DIAGNOSIS — R070 Pain in throat: Secondary | ICD-10-CM | POA: Diagnosis not present

## 2020-04-19 DIAGNOSIS — H527 Unspecified disorder of refraction: Secondary | ICD-10-CM | POA: Diagnosis not present

## 2020-04-19 DIAGNOSIS — H25813 Combined forms of age-related cataract, bilateral: Secondary | ICD-10-CM | POA: Diagnosis not present

## 2020-04-19 DIAGNOSIS — H43392 Other vitreous opacities, left eye: Secondary | ICD-10-CM | POA: Diagnosis not present

## 2020-04-25 ENCOUNTER — Telehealth: Payer: Self-pay | Admitting: Internal Medicine

## 2020-04-25 NOTE — Telephone Encounter (Signed)
Spoke with the pt, she said she went to the ENT and she said he told her that he didn't see anything wrong and advised her to drink more water and to try muccinex bid for a week to see if helped her symptoms. Pt stated she still has to clear her throat on occasions and still has the mucous in the back of her throat most of the time. She is scheduled for an EGD on Thursday with Dr.Carver and she said she is willing to still have it done if Vicente Males feels like she should still have it.  She is still waiting to hear from the preservice center to see if her insurance is going to pay for it. But as of right now she still plans on having the procedure done if Vicente Males thinks she should have it.   Vicente Males, do you feel like she should still have the EGD on Thursday?

## 2020-04-25 NOTE — Telephone Encounter (Signed)
Pt has questions about medication prior to procedure with Dr Abbey Chatters on Thursday. [phone]

## 2020-04-25 NOTE — Telephone Encounter (Signed)
Called pt back and informed her that Roseanne Kaufman, NP recommends that she continue with plans for EGD.  Pt voiced understanding.

## 2020-04-25 NOTE — Telephone Encounter (Signed)
Yes, as having globus sensation, recommend continuing with plans for EGD.

## 2020-04-26 ENCOUNTER — Other Ambulatory Visit: Payer: Self-pay

## 2020-04-26 ENCOUNTER — Other Ambulatory Visit (HOSPITAL_COMMUNITY)
Admission: RE | Admit: 2020-04-26 | Discharge: 2020-04-26 | Disposition: A | Discharge status: Home / Self Care | Payer: PPO | Source: Ambulatory Visit | Attending: Internal Medicine | Admitting: Internal Medicine

## 2020-04-26 DIAGNOSIS — Z20822 Contact with and (suspected) exposure to covid-19: Secondary | ICD-10-CM | POA: Insufficient documentation

## 2020-04-26 DIAGNOSIS — Z01812 Encounter for preprocedural laboratory examination: Secondary | ICD-10-CM | POA: Diagnosis not present

## 2020-04-27 LAB — SARS CORONAVIRUS 2 (TAT 6-24 HRS): SARS Coronavirus 2: NEGATIVE

## 2020-04-28 ENCOUNTER — Encounter (HOSPITAL_COMMUNITY): Payer: Self-pay

## 2020-04-28 ENCOUNTER — Other Ambulatory Visit: Payer: Self-pay

## 2020-04-28 ENCOUNTER — Encounter (HOSPITAL_COMMUNITY)
Admission: RE | Disposition: A | Discharge status: Home / Self Care | Payer: Self-pay | Source: Home / Self Care | Attending: Internal Medicine

## 2020-04-28 ENCOUNTER — Ambulatory Visit (HOSPITAL_COMMUNITY)
Admission: RE | Admit: 2020-04-28 | Discharge: 2020-04-28 | Disposition: A | Discharge status: Home / Self Care | Payer: PPO | Attending: Internal Medicine | Admitting: Internal Medicine

## 2020-04-28 ENCOUNTER — Ambulatory Visit (HOSPITAL_COMMUNITY): Payer: PPO | Admitting: Certified Registered"

## 2020-04-28 ENCOUNTER — Other Ambulatory Visit (HOSPITAL_COMMUNITY): Payer: Self-pay | Admitting: Family Medicine

## 2020-04-28 DIAGNOSIS — Z853 Personal history of malignant neoplasm of breast: Secondary | ICD-10-CM | POA: Insufficient documentation

## 2020-04-28 DIAGNOSIS — E7800 Pure hypercholesterolemia, unspecified: Secondary | ICD-10-CM | POA: Diagnosis not present

## 2020-04-28 DIAGNOSIS — K222 Esophageal obstruction: Secondary | ICD-10-CM | POA: Diagnosis not present

## 2020-04-28 DIAGNOSIS — F419 Anxiety disorder, unspecified: Secondary | ICD-10-CM | POA: Insufficient documentation

## 2020-04-28 DIAGNOSIS — Z1231 Encounter for screening mammogram for malignant neoplasm of breast: Secondary | ICD-10-CM

## 2020-04-28 DIAGNOSIS — K2950 Unspecified chronic gastritis without bleeding: Secondary | ICD-10-CM | POA: Insufficient documentation

## 2020-04-28 DIAGNOSIS — R1310 Dysphagia, unspecified: Secondary | ICD-10-CM | POA: Insufficient documentation

## 2020-04-28 DIAGNOSIS — K2970 Gastritis, unspecified, without bleeding: Secondary | ICD-10-CM

## 2020-04-28 DIAGNOSIS — Z79899 Other long term (current) drug therapy: Secondary | ICD-10-CM | POA: Diagnosis not present

## 2020-04-28 DIAGNOSIS — I10 Essential (primary) hypertension: Secondary | ICD-10-CM | POA: Insufficient documentation

## 2020-04-28 DIAGNOSIS — K219 Gastro-esophageal reflux disease without esophagitis: Secondary | ICD-10-CM | POA: Insufficient documentation

## 2020-04-28 HISTORY — PX: BIOPSY: SHX5522

## 2020-04-28 HISTORY — PX: BALLOON DILATION: SHX5330

## 2020-04-28 HISTORY — PX: ESOPHAGOGASTRODUODENOSCOPY (EGD) WITH PROPOFOL: SHX5813

## 2020-04-28 SURGERY — ESOPHAGOGASTRODUODENOSCOPY (EGD) WITH PROPOFOL
Anesthesia: General

## 2020-04-28 MED ORDER — GLYCOPYRROLATE 0.2 MG/ML IJ SOLN
0.2000 mg | Freq: Once | INTRAMUSCULAR | Status: AC
  Administered 2020-04-28: 0.2 mg via INTRAVENOUS

## 2020-04-28 MED ORDER — PROPOFOL 10 MG/ML IV BOLUS
INTRAVENOUS | Status: AC
  Filled 2020-04-28: qty 240

## 2020-04-28 MED ORDER — LIDOCAINE VISCOUS HCL 2 % MT SOLN
15.0000 mL | Freq: Once | OROMUCOSAL | Status: AC
  Administered 2020-04-28: 15 mL via OROMUCOSAL

## 2020-04-28 MED ORDER — PROPOFOL 10 MG/ML IV BOLUS
INTRAVENOUS | Status: DC | PRN
  Administered 2020-04-28: 40 mg via INTRAVENOUS
  Administered 2020-04-28: 100 mg via INTRAVENOUS
  Administered 2020-04-28: 40 mg via INTRAVENOUS
  Administered 2020-04-28: 20 mg via INTRAVENOUS

## 2020-04-28 MED ORDER — LIDOCAINE VISCOUS HCL 2 % MT SOLN
OROMUCOSAL | Status: AC
  Filled 2020-04-28: qty 15

## 2020-04-28 MED ORDER — LACTATED RINGERS IV SOLN
INTRAVENOUS | Status: DC
  Administered 2020-04-28: 1000 mL via INTRAVENOUS

## 2020-04-28 MED ORDER — GLYCOPYRROLATE 0.2 MG/ML IJ SOLN
INTRAMUSCULAR | Status: AC
  Filled 2020-04-28: qty 1

## 2020-04-28 MED ORDER — LIDOCAINE 2% (20 MG/ML) 5 ML SYRINGE
INTRAMUSCULAR | Status: AC
  Filled 2020-04-28: qty 25

## 2020-04-28 MED ORDER — CHLORHEXIDINE GLUCONATE CLOTH 2 % EX PADS: 6.0000 | MEDICATED_PAD | Freq: Once | CUTANEOUS | Status: DC

## 2020-04-28 NOTE — Transfer of Care (Signed)
Immediate Anesthesia Transfer of Care Note  Patient: [name] [name]  Procedure(s) Performed: ESOPHAGOGASTRODUODENOSCOPY (EGD) WITH PROPOFOL (N/A ) BALLOON DILATION (N/A ) BIOPSY  Patient Location: Endoscopy Unit  Anesthesia Type:General  Level of Consciousness: awake, alert , oriented and patient cooperative  Airway & Oxygen Therapy: Patient Spontanous Breathing and Patient connected to nasal cannula oxygen  Post-op Assessment: Report given to RN, Post -op Vital signs reviewed and stable and Patient moving all extremities  Post vital signs: Reviewed and stable  Last Vitals:  Vitals Value Taken Time  BP    Temp 36.5 C 04/28/20 0748  Pulse 87 04/28/20 0748  Resp 18 04/28/20 0748  SpO2 97 % 04/28/20 0748    Last Pain:  Vitals:   04/28/20 0748  TempSrc: Oral  PainSc: 0-No pain      Patients Stated Pain Goal: 7 (07/68/08 8110)  Complications: No complications documented.

## 2020-04-28 NOTE — Anesthesia Preprocedure Evaluation (Signed)
Anesthesia Evaluation  Patient identified by MRN, date of birth, ID band Patient awake    Reviewed: Allergy & Precautions, H&P , NPO status , Patient's Chart, lab work & pertinent test results, reviewed documented beta blocker date and time   Airway Mallampati: I  TM Distance: >3 FB Neck ROM: full    Dental no notable dental hx.    Pulmonary neg pulmonary ROS,    Pulmonary exam normal breath sounds clear to auscultation       Cardiovascular Exercise Tolerance: Good hypertension, negative cardio ROS   Rhythm:regular Rate:Normal     Neuro/Psych PSYCHIATRIC DISORDERS Anxiety negative neurological ROS     GI/Hepatic Neg liver ROS, GERD  Medicated,  Endo/Other  negative endocrine ROS  Renal/GU negative Renal ROS  negative genitourinary   Musculoskeletal   Abdominal   Peds  Hematology  (+) Blood dyscrasia, anemia ,   Anesthesia Other Findings   Reproductive/Obstetrics negative OB ROS                             Anesthesia Physical Anesthesia Plan  ASA: II  Anesthesia Plan: General   Post-op Pain Management:    Induction:   PONV Risk Score and Plan: 3 and Propofol infusion  Airway Management Planned:   Additional Equipment:   Intra-op Plan:   Post-operative Plan:   Informed Consent: I have reviewed the patients History and Physical, chart, labs and discussed the procedure including the risks, benefits and alternatives for the proposed anesthesia with the patient or authorized representative who has indicated his/her understanding and acceptance.     Dental Advisory Given  Plan Discussed with: CRNA  Anesthesia Plan Comments:         Anesthesia Quick Evaluation

## 2020-04-28 NOTE — H&P (Signed)
Primary Care Physician:  Lemmie Evens, MD Primary Gastroenterologist:  Dr. Abbey Chatters  Pre-Procedure History & Physical: HPI:  [name] [name] is a 69 y.o. female is here for a EGD   Past Medical History:  Diagnosis Date  . Breast cancer ([place])   . Functional dyspepsia MAR 2014   APR 2014: PT DECLINED BRAVO. PPIs-PROTOIX/PRILOSEC-->DEXILANT-->NEXIUM(TOO EXPENSIVE)  . GERD (gastroesophageal reflux disease)   . High cholesterol   . Hypertension     Past Surgical History:  Procedure Laterality Date  . BREAST LUMPECTOMY  2006   Left  breast  . BREAST LUMPECTOMY  1994   Right breast-benign  . CHOLECYSTECTOMY N/A 05/10/2016   Procedure: LAPAROSCOPIC CHOLECYSTECTOMY WITH INTRAOPERATIVE CHOLANGIOGRAM;  Surgeon: Aviva Signs, MD;  Location: [facility];  Service: General;  Laterality: N/A;  . COLONOSCOPY  2009   normal colon without evidence of polyps  . ESOPHAGOGASTRODUODENOSCOPY  2009   normal stomach,esophagues without mass,reosion  . ESOPHAGOGASTRODUODENOSCOPY  09/17/2011   SLF: Mild gastritis/NO SOURCE FOR IRON DEFICINECY ANEMIA IDENTIFIED  . ESOPHAGOGASTRODUODENOSCOPY N/A 12/02/2012   SLF: 1. No source for dyspepsia identified 2. MILD Non-erosive gastritis  . GIVENS CAPSULE STUDY  09/26/2011   Procedure: GIVENS CAPSULE STUDY;  Surgeon: Dorothyann Peng, MD;  Location: [facility]ITE;  Service: Endoscopy;  Laterality: N/A;  . ILEOColonoscopy  09/17/2011   SLF: Nl colonoscopy WMO/INTERNA; HEMORRHOIDS/ NO SOURCE FOR IRON DEFICIENCY ANEMA IDENTIFIED  . LAPAROSCOPIC APPENDECTOMY N/A 06/25/2018   Procedure: APPENDECTOMY LAPAROSCOPIC;  Surgeon: Aviva Signs, MD;  Location: [facility];  Service: General;  Laterality: N/A;    Prior to Admission medications   Medication Sig Start Date End Date Taking? Authorizing Provider  atorvastatin (LIPITOR) 20 MG tablet Take 20 mg by mouth at bedtime.  09/09/17  Yes [provider]  calcium carbonate (TUMS - DOSED IN MG ELEMENTAL CALCIUM) 500 MG chewable tablet  Chew 1 tablet by mouth daily as needed for indigestion or heartburn.   Yes [provider]  Calcium Carbonate-Vitamin D (CALCIUM 600+D) 600-400 MG-UNIT tablet Take 1 tablet by mouth 2 (two) times daily.    Yes [provider]  enalapril (VASOTEC) 10 MG tablet Take 10 mg by mouth daily.     Yes [provider]  LORazepam (ATIVAN) 0.5 MG tablet Take 0.5 mg by mouth at bedtime.   Yes [provider]  pantoprazole (PROTONIX) 40 MG tablet TAKE 1 TABLET(40 MG) BY MOUTH TWICE DAILY BEFORE A MEAL 04/08/20  Yes Mahala Menghini, PA-C  vitamin B-12 (CYANOCOBALAMIN) 1000 MCG tablet Take 2,000 mcg by mouth daily.   Yes [provider]  clobetasol cream (TEMOVATE) 7.85 % Apply 1 application topically daily as needed (irritation).    [provider]  docusate sodium (COLACE) 100 MG capsule Take 200 mg by mouth at bedtime as needed for mild constipation.     [provider]  valACYclovir (VALTREX) 500 MG tablet Take 500 mg by mouth as needed (for outbreaks).     [provider]    Allergies as of 04/06/2020 - Review Complete 03/15/2020  Allergen Reaction Noted  . Sulfa antibiotics Itching and Rash 05/09/2016  . Tape Rash 05/10/2016    Family History  Problem Relation Age of Onset  . Colon cancer Maternal Aunt   . Anesthesia problems Neg Hx   . Hypotension Neg Hx   . Malignant hyperthermia Neg Hx   . Pseudochol deficiency Neg Hx   . Colon polyps Neg Hx     Social History  Socioeconomic History  . Marital status: Married    Spouse name: Not on file  . Number of children: Not on file  . Years of education: Not on file  . Highest education level: Not on file  Occupational History  . Not on file  Tobacco Use  . Smoking status: Never Smoker  . Smokeless tobacco: Never Used  Vaping Use  . Vaping Use: Never used  Substance and Sexual Activity  . Alcohol use: No  . Drug use: No  . Sexual activity: Not on file  Other Topics  Concern  . Not on file  Social History Narrative  . Not on file   Social Determinants of Health   Financial Resource Strain:   . Difficulty of Paying Living Expenses: Not on file  Food Insecurity:   . Worried About Charity fundraiser in the Last Year: Not on file  . Ran Out of Food in the Last Year: Not on file  Transportation Needs:   . Lack of Transportation (Medical): Not on file  . Lack of Transportation (Non-Medical): Not on file  Physical Activity:   . Days of Exercise per Week: Not on file  . Minutes of Exercise per Session: Not on file  Stress:   . Feeling of Stress : Not on file  Social Connections:   . Frequency of Communication with Friends and Family: Not on file  . Frequency of Social Gatherings with Friends and Family: Not on file  . Attends Religious Services: Not on file  . Active Member of Clubs or Organizations: Not on file  . Attends Archivist Meetings: Not on file  . Marital Status: Not on file  Intimate Partner Violence:   . Fear of Current or Ex-Partner: Not on file  . Emotionally Abused: Not on file  . Physically Abused: Not on file  . Sexually Abused: Not on file    Review of Systems: See HPI, otherwise negative ROS  Impression/Plan: [name] [name] is here for a EGD to be performed for dysphagia, reflux  Risks, benefits, limitations, imponderables and alternatives regarding colonoscopy have been reviewed with the patient. Questions have been answered. All parties agreeable.

## 2020-04-28 NOTE — Anesthesia Postprocedure Evaluation (Signed)
Anesthesia Post Note  Patient: Annalina Needles Fuentes  Procedure(s) Performed: ESOPHAGOGASTRODUODENOSCOPY (EGD) WITH PROPOFOL (N/A ) BALLOON DILATION (N/A ) BIOPSY  Patient location during evaluation: Endoscopy Anesthesia Type: General Level of consciousness: awake, oriented, awake and alert and patient cooperative Pain management: pain level controlled Vital Signs Assessment: post-procedure vital signs reviewed and stable Respiratory status: spontaneous breathing, respiratory function stable and patient connected to nasal cannula oxygen Cardiovascular status: blood pressure returned to baseline and stable Postop Assessment: no headache and no backache Anesthetic complications: no   No complications documented.   Last Vitals:  Vitals:   04/28/20 0633 04/28/20 0748  BP: (!) 137/92 (!) 96/57  Pulse: 93 87  Resp: 18 18  Temp: 37 C 36.5 C  SpO2: 100% 97%    Last Pain:  Vitals:   04/28/20 0748  TempSrc: Oral  PainSc: 0-No pain                 Tacy Learn

## 2020-04-28 NOTE — Op Note (Signed)
[facility] Patient Name: [name] [name] Procedure Date: 04/28/2020 7:05 AM MRN: [id number] Date of Birth: 11-30-1950 Attending MD: Elon Alas. Abbey Chatters DO CSN: [id number] Age: 69 Admit Type: Outpatient Procedure:                Upper GI endoscopy Indications:              Dysphagia Providers:                Elon Alas. Abbey Chatters, DO, Janeece Riggers, RN, Lambert Mody, Casimer Bilis, Technician, Aram Candela Referring MD:              Medicines:                See the Anesthesia note for documentation of the                            administered medications Complications:            No immediate complications. Estimated Blood Loss:     Estimated blood loss was minimal. Procedure:                Pre-Anesthesia Assessment:                           - The anesthesia plan was to use monitored                            anesthesia care (MAC).                           After obtaining informed consent, the endoscope was                            passed under direct vision. Throughout the                            procedure, the patient's blood pressure, pulse, and                            oxygen saturations were monitored continuously. The                            [id number]-H190 ([id number]) scope was introduced through the                            mouth, and advanced to the second part of duodenum.                            The upper GI endoscopy was accomplished without                            difficulty. The patient tolerated the procedure  well. Scope In: 7:41:17 AM Scope Out: 7:45:18 AM Total Procedure Duration: 0 hours 4 minutes 1 second  Findings:      The Z-line was regular and was found 40 cm from the incisors.      One benign-appearing, intrinsic mild stenosis was found in the lower       third of the esophagus. The stenosis was traversed. A TTS dilator was       passed through the  scope. Dilation with an 18-19-20 mm balloon dilator       was performed to 20 mm. The dilation site was examined and showed       moderate improvement in luminal narrowing.      Localized mild inflammation characterized by erythema was found in the       gastric antrum. Biopsies were taken with a cold forceps for Helicobacter       pylori testing.      The duodenal bulb, first portion of the duodenum and second portion of       the duodenum were normal. Impression:               - Z-line regular, 40 cm from the incisors.                           - Benign-appearing esophageal stenosis. Dilated.                           - Gastritis. Biopsied.                           - Normal duodenal bulb, first portion of the                            duodenum and second portion of the duodenum. Moderate Sedation:      Per Anesthesia Care Recommendation:           - Patient has a contact number available for                            emergencies. The signs and symptoms of potential                            delayed complications were discussed with the                            patient. Return to normal activities tomorrow.                            Written discharge instructions were provided to the                            patient.                           - Resume previous diet.                           - Continue present medications.                           -  Await pathology results.                           - Repeat upper endoscopy PRN for retreatment.                           - Return to GI clinic as previously scheduled with                            Roseanne Kaufman. Procedure Code(s):        --- Professional ---                           [phone], Esophagogastroduodenoscopy, flexible,                            transoral; with transendoscopic balloon dilation of                            esophagus (less than 30 mm diameter)                           43239, 59,  Esophagogastroduodenoscopy, flexible,                            transoral; with biopsy, single or multiple Diagnosis Code(s):        --- Professional ---                           K22.2, Esophageal obstruction                           K29.70, Gastritis, unspecified, without bleeding                           R13.10, Dysphagia, unspecified CPT copyright 2019 American Medical Association. All rights reserved. The codes documented in this report are preliminary and upon coder review may  be revised to meet current compliance requirements. Elon Alas. Abbey Chatters, [place] Abbey Chatters, DO 04/28/2020 7:51:36 AM This report has been signed electronically. Number of Addenda: 0

## 2020-04-28 NOTE — Discharge Instructions (Signed)
EGD Discharge instructions Please read the instructions outlined below and refer to this sheet in the next few weeks. These discharge instructions provide you with general information on caring for yourself after you leave the hospital. Your doctor may also give you specific instructions. While your treatment has been planned according to the most current medical practices available, unavoidable complications occasionally occur. If you have any problems or questions after discharge, please call your doctor. ACTIVITY  You may resume your regular activity but move at a slower pace for the next 24 hours.   Take frequent rest periods for the next 24 hours.   Walking will help expel (get rid of) the air and reduce the bloated feeling in your abdomen.   No driving for 24 hours (because of the anesthesia (medicine) used during the test).   You may shower.   Do not sign any important legal documents or operate any machinery for 24 hours (because of the anesthesia used during the test).  NUTRITION  Drink plenty of fluids.   You may resume your normal diet.   Begin with a light meal and progress to your normal diet.   Avoid alcoholic beverages for 24 hours or as instructed by your caregiver.  MEDICATIONS  You may resume your normal medications unless your caregiver tells you otherwise.  WHAT YOU CAN EXPECT TODAY  You may experience abdominal discomfort such as a feeling of fullness or "gas" pains.  FOLLOW-UP  Your doctor will discuss the results of your test with you.  SEEK IMMEDIATE MEDICAL ATTENTION IF ANY OF THE FOLLOWING OCCUR:  Excessive nausea (feeling sick to your stomach) and/or vomiting.   Severe abdominal pain and distention (swelling).   Trouble swallowing.   Temperature over 101 F (37.8 C).   Rectal bleeding or vomiting of blood.   Your EGD showed a mild amount of inflammation in your stomach.  I biopsied this to rule out H. pylori infection.  Continue on twice daily  Protonix for 8 weeks and then decrease down to once daily.  He did have a mild esophageal stenosis which I dilated with a balloon.  Hopefully this helps with your swallowing.  We may need to repeat this in the future if your symptoms return.  Follow-up with GI as previously scheduled.  I have a great rest your week!

## 2020-04-29 ENCOUNTER — Other Ambulatory Visit: Payer: Self-pay

## 2020-04-29 LAB — SURGICAL PATHOLOGY

## 2020-05-02 ENCOUNTER — Encounter (HOSPITAL_COMMUNITY): Payer: Self-pay | Admitting: Internal Medicine

## 2020-05-16 ENCOUNTER — Other Ambulatory Visit: Payer: Self-pay

## 2020-05-16 ENCOUNTER — Ambulatory Visit (HOSPITAL_COMMUNITY)
Admission: RE | Admit: 2020-05-16 | Discharge: 2020-05-16 | Disposition: A | Discharge status: Home / Self Care | Payer: PPO | Source: Ambulatory Visit | Attending: Family Medicine | Admitting: Family Medicine

## 2020-05-16 DIAGNOSIS — Z1231 Encounter for screening mammogram for malignant neoplasm of breast: Secondary | ICD-10-CM

## 2020-05-17 ENCOUNTER — Telehealth: Payer: Self-pay | Admitting: Internal Medicine

## 2020-05-17 NOTE — Telephone Encounter (Signed)
Spoke with pt. On pts recent EGD, gastritis was listed. Pt has been on a PPI for years and says she always gets the diagnosis of gastritis. Pt would like to know if there is anything she can do to prevent the inflammation? Pt states that she avoids foods that can trigger reflux and isn't sure if she can do anything to stop the gastritis. Pt is aware that Roseanne Kaufman, NP isn't in the office today.

## 2020-05-17 NOTE — Telephone Encounter (Signed)
Pt called to see if her results were available. [phone]

## 2020-05-19 NOTE — Patient Instructions (Signed)
Gastritis, Adult Gastritis is inflammation of the stomach. There are two kinds of gastritis:  Acute gastritis. This kind develops suddenly.  Chronic gastritis. This kind is much more common and lasts for a long time. Gastritis happens when the lining of the stomach becomes weak or gets damaged. Without treatment, gastritis can lead to stomach bleeding and ulcers. What are the causes? This condition may be caused by:  An infection.  Drinking too much alcohol.  Certain medicines. These include steroids, antibiotics, and some over-the-counter medicines, such as aspirin or ibuprofen.  Having too much acid in the stomach.  A disease of the intestines or stomach.  Stress.  An allergic reaction.  Crohn's disease.  Some cancer treatments (radiation). Sometimes the cause of this condition is not known. What are the signs or symptoms? Symptoms of this condition include:  Pain or a burning sensation in the upper abdomen.  Nausea.  Vomiting.  An uncomfortable feeling of fullness after eating.  Weight loss.  Bad breath.  Blood in your vomit or stools. In some cases, there are no symptoms. How is this diagnosed? This condition may be diagnosed with:  Your medical history and a description of your symptoms.  A physical exam.  Tests. These can include: ? Blood tests. ? Stool tests. ? A test in which a thin, flexible instrument with a light and a camera is passed down the esophagus and into the stomach (upper endoscopy). ? A test in which a sample of tissue is taken for testing (biopsy). How is this treated? This condition may be treated with medicines. The medicines that are used vary depending on the cause of the gastritis:  If the condition is caused by a bacterial infection, you may be given antibiotic medicines.  If the condition is caused by too much acid in the stomach, you may be given medicines called H2 blockers, proton pump inhibitors, or antacids. Treatment  may also involve stopping the use of certain medicines, such as aspirin, ibuprofen, or other NSAIDs. Follow these instructions at home: Medicines  Take over-the-counter and prescription medicines only as told by your health care provider.  If you were prescribed an antibiotic medicine, take it as told by your health care provider. Do not stop taking the antibiotic even if you start to feel better. Eating and drinking   Eat small, frequent meals instead of large meals.  Avoid foods and drinks that make your symptoms worse.  Drink enough fluid to keep your urine pale yellow. Alcohol use  Do not drink alcohol if: ? Your health care provider tells you not to drink. ? You are pregnant, may be pregnant, or are planning to become pregnant.  If you drink alcohol: ? Limit your use to:  0-1 drink a day for women.  0-2 drinks a day for men. ? Be aware of how much alcohol is in your drink. In the [place] one drink equals one 12 oz bottle of beer (355 mL), one 5 oz glass of wine (148 mL), or one 1 oz glass of hard liquor (44 mL). General instructions  Talk with your health care provider about ways to manage stress, such as getting regular exercise or practicing deep breathing, meditation, or yoga.  Do not use any products that contain nicotine or tobacco, such as cigarettes and e-cigarettes. If you need help quitting, ask your health care provider.  Keep all follow-up visits as told by your health care provider. This is important. Contact a health care provider if:  Your   symptoms get worse.  Your symptoms return after treatment. Get help right away if:  You vomit blood or material that looks like coffee grounds.  You have black or dark red stools.  You are unable to keep fluids down.  Your abdominal pain gets worse.  You have a fever.  You do not feel better after one week. Summary  Gastritis is inflammation of the lining of the stomach that can occur suddenly (acute) or  develop slowly over time (chronic).  This condition is diagnosed with a medical history, a physical exam, or tests.  This condition may be treated with medicines to treat infection or medicines to reduce the amount of acid in your stomach.  Follow your health care provider's instructions about taking medicines, making changes to your diet, and knowing when to call for help. This information is not intended to replace advice given to you by your health care provider. Make sure you discuss any questions you have with your health care provider. Document Revised: 01/07/2018 Document Reviewed: 01/07/2018 Elsevier Patient Education  2020 [employer]  

## 2020-05-19 NOTE — Telephone Encounter (Signed)
Please send patient the below information. She has mild chronic gastritis. It is not severe. Certain medications can cause it, too much acid, stress, etc. I would not be concerned about it at this point and would focus on eating healthy as she is doing, avoiding NSAIDs, no alcohol, etc.        Gastritis, Adult Gastritis is inflammation of the stomach. There are two kinds of gastritis:  Acute gastritis. This kind develops suddenly.  Chronic gastritis. This kind is much more common and lasts for a long time. Gastritis happens when the lining of the stomach becomes weak or gets damaged. Without treatment, gastritis can lead to stomach bleeding and ulcers. What are the causes? This condition may be caused by:  An infection.  Drinking too much alcohol.  Certain medicines. These include steroids, antibiotics, and some over-the-counter medicines, such as aspirin or ibuprofen.  Having too much acid in the stomach.  A disease of the intestines or stomach.  Stress.  An allergic reaction.  Crohn's disease.  Some cancer treatments (radiation). Sometimes the cause of this condition is not known. What are the signs or symptoms? Symptoms of this condition include:  Pain or a burning sensation in the upper abdomen.  Nausea.  Vomiting.  An uncomfortable feeling of fullness after eating.  Weight loss.  Bad breath.  Blood in your vomit or stools. In some cases, there are no symptoms. How is this diagnosed? This condition may be diagnosed with:  Your medical history and a description of your symptoms.  A physical exam.  Tests. These can include: ? Blood tests. ? Stool tests. ? A test in which a thin, flexible instrument with a light and a camera is passed down the esophagus and into the stomach (upper endoscopy). ? A test in which a sample of tissue is taken for testing (biopsy). How is this treated? This condition may be treated with medicines. The medicines that are  used vary depending on the cause of the gastritis:  If the condition is caused by a bacterial infection, you may be given antibiotic medicines.  If the condition is caused by too much acid in the stomach, you may be given medicines called H2 blockers, proton pump inhibitors, or antacids. Treatment may also involve stopping the use of certain medicines, such as aspirin, ibuprofen, or other NSAIDs. Follow these instructions at home: Medicines  Take over-the-counter and prescription medicines only as told by your health care provider.  If you were prescribed an antibiotic medicine, take it as told by your health care provider. Do not stop taking the antibiotic even if you start to feel better. Eating and drinking   Eat small, frequent meals instead of large meals.  Avoid foods and drinks that make your symptoms worse.  Drink enough fluid to keep your urine pale yellow. Alcohol use  Do not drink alcohol if: ? Your health care provider tells you not to drink. ? You are pregnant, may be pregnant, or are planning to become pregnant.  If you drink alcohol: ? Limit your use to:  0-1 drink a day for women.  0-2 drinks a day for men. ? Be aware of how much alcohol is in your drink. In the [place] one drink equals one 12 oz bottle of beer (355 mL), one 5 oz glass of wine (148 mL), or one 1 oz glass of hard liquor (44 mL). General instructions  Talk with your health care provider about ways to manage stress, such as getting regular  exercise or practicing deep breathing, meditation, or yoga.  Do not use any products that contain nicotine or tobacco, such as cigarettes and e-cigarettes. If you need help quitting, ask your health care provider.  Keep all follow-up visits as told by your health care provider. This is important. Contact a health care provider if:  Your symptoms get worse.  Your symptoms return after treatment. Get help right away if:  You vomit blood or material that  looks like coffee grounds.  You have black or dark red stools.  You are unable to keep fluids down.  Your abdominal pain gets worse.  You have a fever.  You do not feel better after one week. Summary  Gastritis is inflammation of the lining of the stomach that can occur suddenly (acute) or develop slowly over time (chronic).  This condition is diagnosed with a medical history, a physical exam, or tests.  This condition may be treated with medicines to treat infection or medicines to reduce the amount of acid in your stomach.  Follow your health care provider's instructions about taking medicines, making changes to your diet, and knowing when to call for help. This information is not intended to replace advice given to you by your health care provider. Make sure you discuss any questions you have with your health care provider. Document Revised: 01/07/2018 Document Reviewed: 01/07/2018 Elsevier Patient Education  [place].

## 2020-05-19 NOTE — Telephone Encounter (Signed)
Spoke with pt. Pt is aware that she has mild gastritis and I'm mailing pt information provided by Leeanne Deed, NP.

## 2020-08-10 ENCOUNTER — Other Ambulatory Visit: Payer: Self-pay | Admitting: Gastroenterology

## 2020-08-30 ENCOUNTER — Telehealth: Payer: Self-pay | Admitting: Internal Medicine

## 2020-08-30 NOTE — Telephone Encounter (Signed)
[phone] PLEASE CALL PATIENT, SHE HAD COVID AROUND THANKSGIVING AND SINCE THEN HAS HAS REFLUX ISSUES THAT ARE NOT CONTROLLED BY HER MEDICATION.  IS THERE SOMETHING ELSE SHE CAN TRY?  SHE HAS JAN APPOINTMENT HERE

## 2020-08-31 ENCOUNTER — Other Ambulatory Visit: Payer: Self-pay

## 2020-08-31 ENCOUNTER — Other Ambulatory Visit: Payer: Self-pay | Admitting: *Deleted

## 2020-08-31 ENCOUNTER — Ambulatory Visit: Payer: PPO | Admitting: Internal Medicine

## 2020-08-31 ENCOUNTER — Encounter: Payer: Self-pay | Admitting: Internal Medicine

## 2020-08-31 VITALS — BP 135/74 | HR 111 | Temp 96.9°F | Ht 65.0 in | Wt 124.8 lb

## 2020-08-31 DIAGNOSIS — K219 Gastro-esophageal reflux disease without esophagitis: Secondary | ICD-10-CM

## 2020-08-31 DIAGNOSIS — R079 Chest pain, unspecified: Secondary | ICD-10-CM | POA: Diagnosis not present

## 2020-08-31 MED ORDER — DEXLANSOPRAZOLE 60 MG PO CPDR: 60.0000 mg | DELAYED_RELEASE_CAPSULE | Freq: Every day | ORAL | 5 refills | Status: DC

## 2020-08-31 NOTE — Patient Instructions (Signed)
I will send in a prescription for Dexilant 60 mg daily.  You will need to take this 30 minutes before breakfast every day.  Hopefully this helps with your symptoms.  I will also refer you to cardiology to further evaluate your new onset pain to make sure this is not heart related, especially given the diagnosis of Covid last month.  Follow-up with me in 6 weeks or sooner if needed.  At [facility] we value your feedback. You may receive a survey about your visit today. Please share your experience as we strive to create trusting relationships with our patients to provide genuine, compassionate, quality care.  We appreciate your understanding and patience as we review any laboratory studies, imaging, and other diagnostic tests that are ordered as we care for you. Our office policy is 5 business days for review of these results, and any emergent or urgent results are addressed in a timely manner for your best interest. If you do not hear from our office in 1 week, please contact us.   We also encourage the use of MyChart, which contains your medical information for your review as well. If you are not enrolled in this feature, an access code is on this after visit summary for your convenience. Thank you for allowing [place] to be involved in your care.  It was great to see you today!  I hope you have a great rest of your winter!!    Hennie Duos. Marletta Lor, D.O. Gastroenterology and Hepatology [facility]

## 2020-08-31 NOTE — Progress Notes (Signed)
Referring Provider: Lemmie Evens, MD Primary Care Physician:  Lemmie Evens, MD Primary GI:  Dr. Abbey Chatters  Chief Complaint  Patient presents with   feels like something stuck in throat    Started after having covid few weeks ago. Sometimes gets little relief after burping. Increased Protonix back to bid with no relief    HPI:   [name] [name] is a 69 y.o. female who presents to the clinic today for follow-up visit.  Seen previously for chronic reflux as well as dysphagia.  She underwent EGD in August which showed gastritis, H. pylori negative.  She did have a slight esophageal narrowing so I dilated with a 20 mm balloon.  She states her symptoms improved status post dilation as well as Protonix 40 mg twice daily until November when she developed Covid.  States since that time she has new onset substernal pressure/pain.  States it is intermittent in nature.  Does not believe it is associated with exertion.  Not worsened by eating.  States she is watched her diet closely and only eats bland food.  States her dysphagia is actually resolved.  States this is totally different symptoms than prior.  Does note some recent stress as her entire family got Covid a few which were "very sick."  Past Medical History:  Diagnosis Date   Breast cancer ([place])    Functional dyspepsia MAR 2014   APR 2014: PT DECLINED BRAVO. PPIs-PROTOIX/PRILOSEC-->DEXILANT-->NEXIUM(TOO EXPENSIVE)   GERD (gastroesophageal reflux disease)    High cholesterol    Hypertension     Past Surgical History:  Procedure Laterality Date   BALLOON DILATION N/A 04/28/2020   Procedure: BALLOON DILATION;  Surgeon: Eloise Harman, DO;  [facility];  Service: Endoscopy;  Laterality: N/A;   BIOPSY  04/28/2020   Procedure: BIOPSY;  Surgeon: Eloise Harman, DO;  [facility];  Service: Endoscopy;;   BREAST LUMPECTOMY  2006   Left  breast   BREAST LUMPECTOMY  1994   Right breast-benign    CHOLECYSTECTOMY N/A 05/10/2016   Procedure: LAPAROSCOPIC CHOLECYSTECTOMY WITH INTRAOPERATIVE CHOLANGIOGRAM;  Surgeon: Aviva Signs, MD;  Location: [facility];  Service: General;  Laterality: N/A;   COLONOSCOPY  2009   normal colon without evidence of polyps   ESOPHAGOGASTRODUODENOSCOPY  2009   normal stomach,esophagues without mass,reosion   ESOPHAGOGASTRODUODENOSCOPY  09/17/2011   SLF: Mild gastritis/NO SOURCE FOR IRON DEFICINECY ANEMIA IDENTIFIED   ESOPHAGOGASTRODUODENOSCOPY N/A 12/02/2012   SLF: 1. No source for dyspepsia identified 2. MILD Non-erosive gastritis   ESOPHAGOGASTRODUODENOSCOPY (EGD) WITH PROPOFOL N/A 04/28/2020   Procedure: ESOPHAGOGASTRODUODENOSCOPY (EGD) WITH PROPOFOL;  Surgeon: Eloise Harman, DO;  [facility];  Service: Endoscopy;  Laterality: N/A;  7:30am   GIVENS CAPSULE STUDY  09/26/2011   Procedure: GIVENS CAPSULE STUDY;  Surgeon: Dorothyann Peng, MD;  [facility];  Service: Endoscopy;  Laterality: N/A;   ILEOColonoscopy  09/17/2011   SLF: Nl colonoscopy WMO/INTERNA; HEMORRHOIDS/ NO SOURCE FOR IRON DEFICIENCY ANEMA IDENTIFIED   LAPAROSCOPIC APPENDECTOMY N/A 06/25/2018   Procedure: APPENDECTOMY LAPAROSCOPIC;  Surgeon: Aviva Signs, MD;  Location: [facility];  Service: General;  Laterality: N/A;    Current Outpatient Medications  Medication Sig Dispense Refill   atorvastatin (LIPITOR) 20 MG tablet Take 20 mg by mouth at bedtime.   0   calcium carbonate (TUMS - DOSED IN MG ELEMENTAL CALCIUM) 500 MG chewable tablet Chew 1 tablet by mouth daily as needed for indigestion or heartburn.  Calcium Carbonate-Vitamin D 600-400 MG-UNIT tablet Take 1 tablet by mouth 2 (two) times daily.      clobetasol cream (TEMOVATE) 0.05 % Apply 1 application topically daily as needed (irritation).     dexlansoprazole (DEXILANT) 60 MG capsule Take 1 capsule (60 mg total) by mouth daily. 30 capsule 5   docusate sodium (COLACE) 100 MG capsule Take 200 mg by mouth  at bedtime as needed for mild constipation.      enalapril (VASOTEC) 10 MG tablet Take 10 mg by mouth daily.     LORazepam (ATIVAN) 0.5 MG tablet Take 0.5 mg by mouth at bedtime.     valACYclovir (VALTREX) 500 MG tablet Take 500 mg by mouth as needed (for outbreaks).   12   vitamin B-12 (CYANOCOBALAMIN) 1000 MCG tablet Take 2,000 mcg by mouth daily.     No current facility-administered medications for this visit.    Allergies as of 08/31/2020 - Review Complete 08/31/2020  Allergen Reaction Noted   Sulfa antibiotics Itching and Rash 05/09/2016   Tape Rash 05/10/2016    Family History  Problem Relation Age of Onset   Colon cancer Maternal Aunt    Anesthesia problems Neg Hx    Hypotension Neg Hx    Malignant hyperthermia Neg Hx    Pseudochol deficiency Neg Hx    Colon polyps Neg Hx     Social History   Socioeconomic History   Marital status: Married    Spouse name: Not on file   Number of children: Not on file   Years of education: Not on file   Highest education level: Not on file  Occupational History   Not on file  Tobacco Use   Smoking status: Never Smoker   Smokeless tobacco: Never Used  Vaping Use   Vaping Use: Never used  Substance and Sexual Activity   Alcohol use: No   Drug use: No   Sexual activity: Not on file  Other Topics Concern   Not on file  Social History Narrative   Not on file   Social Determinants of Health   Financial Resource Strain: Not on file  Food Insecurity: Not on file  Transportation Needs: Not on file  Physical Activity: Not on file  Stress: Not on file  Social Connections: Not on file    Subjective: Review of Systems  Constitutional: Negative for chills and fever.  HENT: Negative for congestion and hearing loss.   Eyes: Negative for blurred vision and double vision.  Respiratory: Negative for cough and shortness of breath.   Cardiovascular: Positive for chest pain. Negative for palpitations.   Gastrointestinal: Negative for abdominal pain, blood in stool, constipation, diarrhea, heartburn, melena and vomiting.  Genitourinary: Negative for dysuria and urgency.  Musculoskeletal: Negative for joint pain and myalgias.  Skin: Negative for itching and rash.  Neurological: Negative for dizziness and headaches.  Psychiatric/Behavioral: Negative for depression. The patient is not nervous/anxious.      Objective: BP 135/74    Pulse (!) 111    Temp (!) 96.9 F (36.1 C) (Temporal)    Ht 5\' 5"  (1.651 m)    Wt 124 lb 12.8 oz (56.6 kg)    BMI 20.77 kg/m  Physical Exam Constitutional:      Appearance: Normal appearance.  HENT:     Head: Normocephalic and atraumatic.  Eyes:     Extraocular Movements: Extraocular movements intact.     Conjunctiva/sclera: Conjunctivae normal.  Cardiovascular:     Rate and Rhythm: Normal rate and  regular rhythm.  Pulmonary:     Effort: Pulmonary effort is normal.     Breath sounds: Normal breath sounds.  Abdominal:     General: Bowel sounds are normal.     Palpations: Abdomen is soft.  Musculoskeletal:        General: No swelling. Normal range of motion.     Cervical back: Normal range of motion and neck supple.  Skin:    General: Skin is warm and dry.     Coloration: Skin is not jaundiced.  Neurological:     General: No focal deficit present.     Mental Status: She is alert and oriented to person, place, and time.  Psychiatric:        Mood and Affect: Mood normal.        Behavior: Behavior normal.      Assessment: *Chest pain/pressure -new *Chronic reflux  Plan: Etiology of patient's new onset chest pain/pressure unclear.  Based on what she tells me, this is different than her previously noted dysphagia back in August which is actually improved status post esophageal dilation as well as PPI therapy.  I will trial her her on Dexilant 60 mg daily to see if this makes a difference.    At the same time, I think it would be prudent to rule  out cardiac etiology of patient's chest pain.   We will refer her to cardiology today.  Appreciate their help with this patient.   Patient follow-up with me in 6 weeks or sooner if needed.  08/31/2020 1:43 PM   Disclaimer: This note was dictated with voice recognition software. Similar sounding words can inadvertently be transcribed and may not be corrected upon review.

## 2020-08-31 NOTE — Telephone Encounter (Signed)
Phoned the pt, her husband states she is in the shower. I advised that the phones turn over soon and if it's ok I will call her in the morning. The pt's husband agreed that this was ok.

## 2020-09-01 NOTE — Telephone Encounter (Signed)
Pt was seen yesterday by Dr. Marletta Lor and this issue was addressed and we can disregard this message. ( the pt also inquired of referral to Cardiologist, I advised her that it may take up to a week but it would be done and not fall through the cracks.)

## 2020-09-14 ENCOUNTER — Ambulatory Visit: Payer: PPO | Admitting: Gastroenterology

## 2020-09-21 ENCOUNTER — Encounter: Payer: Self-pay | Admitting: Cardiology

## 2020-09-21 ENCOUNTER — Ambulatory Visit (INDEPENDENT_AMBULATORY_CARE_PROVIDER_SITE_OTHER): Payer: PPO | Admitting: Cardiology

## 2020-09-21 VITALS — BP 152/80 | HR 82 | Ht 65.0 in | Wt 126.4 lb

## 2020-09-21 DIAGNOSIS — R0789 Other chest pain: Secondary | ICD-10-CM | POA: Diagnosis not present

## 2020-09-21 DIAGNOSIS — I10 Essential (primary) hypertension: Secondary | ICD-10-CM

## 2020-09-21 NOTE — Progress Notes (Signed)
Clinical Summary [name] [name] is a 70 y.o.female seen as a new consult, referred by Dr [name] [name] for the following medical problems.  1. Chest pain - history of dysphagia, from recent GI eval recent symptoms different from prior GI symptoms  - pain started Dec 18, after having COVID - new pain, feeling of something being stuck in midchest.  - improving with dexilant, maalox. Feels like she needs to belch, can improve symptoms.  - not exertional, typically occurs. Can last up to 24 hours. Not positional. No other associated symptoms - does heavy housework without troubles.   CAD risk factors: hyperlipidemia, 2 brothers with history CAD, HTN   2. HTN Home bp's usually 120s/50s  Past Medical History:  Diagnosis Date  . Breast cancer ([place])   . Functional dyspepsia MAR 2014   APR 2014: PT DECLINED BRAVO. PPIs-PROTOIX/PRILOSEC-->DEXILANT-->NEXIUM(TOO EXPENSIVE)  . GERD (gastroesophageal reflux disease)   . High cholesterol   . Hypertension      Allergies  Allergen Reactions  . Sulfa Antibiotics Itching and Rash  . Tape Rash    Surgical tape causes blisters and rash     Current Outpatient Medications  Medication Sig Dispense Refill  . atorvastatin (LIPITOR) 20 MG tablet Take 20 mg by mouth at bedtime.   0  . calcium carbonate (TUMS - DOSED IN MG ELEMENTAL CALCIUM) 500 MG chewable tablet Chew 1 tablet by mouth daily as needed for indigestion or heartburn.    . Calcium Carbonate-Vitamin D 600-400 MG-UNIT tablet Take 1 tablet by mouth 2 (two) times daily.     . clobetasol cream (TEMOVATE) 7.16 % Apply 1 application topically daily as needed (irritation).    Marland Kitchen dexlansoprazole (DEXILANT) 60 MG capsule Take 1 capsule (60 mg total) by mouth daily. 30 capsule 5  . docusate sodium (COLACE) 100 MG capsule Take 200 mg by mouth at bedtime as needed for mild constipation.     . enalapril (VASOTEC) 10 MG tablet Take 10 mg by mouth daily.    Marland Kitchen LORazepam (ATIVAN) 0.5 MG tablet Take 0.5 mg by  mouth at bedtime.    . valACYclovir (VALTREX) 500 MG tablet Take 500 mg by mouth as needed (for outbreaks).   12  . vitamin B-12 (CYANOCOBALAMIN) 1000 MCG tablet Take 2,000 mcg by mouth daily.     No current facility-administered medications for this visit.     Past Surgical History:  Procedure Laterality Date  . BALLOON DILATION N/A 04/28/2020   Procedure: BALLOON DILATION;  Surgeon: [name] Harman, DO;  [facility];  Service: Endoscopy;  Laterality: N/A;  . BIOPSY  04/28/2020   Procedure: BIOPSY;  Surgeon: [name] Harman, DO;  [facility];  Service: Endoscopy;;  . BREAST LUMPECTOMY  2006   Left  breast  . BREAST LUMPECTOMY  1994   Right breast-benign  . CHOLECYSTECTOMY N/A 05/10/2016   Procedure: LAPAROSCOPIC CHOLECYSTECTOMY WITH INTRAOPERATIVE CHOLANGIOGRAM;  Surgeon: [name] Signs, MD;  Location: [facility];  Service: General;  Laterality: N/A;  . COLONOSCOPY  2009   normal colon without evidence of polyps  . ESOPHAGOGASTRODUODENOSCOPY  2009   normal stomach,esophagues without mass,reosion  . ESOPHAGOGASTRODUODENOSCOPY  09/17/2011   SLF: Mild gastritis/NO SOURCE FOR IRON DEFICINECY ANEMIA IDENTIFIED  . ESOPHAGOGASTRODUODENOSCOPY N/A 12/02/2012   SLF: 1. No source for dyspepsia identified 2. MILD Non-erosive gastritis  . ESOPHAGOGASTRODUODENOSCOPY (EGD) WITH PROPOFOL N/A 04/28/2020   Procedure: ESOPHAGOGASTRODUODENOSCOPY (EGD) WITH PROPOFOL;  Surgeon: [name] Harman, DO;  [facility];  Service: Endoscopy;  Laterality: N/A;  7:30am  . GIVENS CAPSULE STUDY  09/26/2011   Procedure: GIVENS CAPSULE STUDY;  Surgeon: [name] Peng, MD;  [facility];  Service: Endoscopy;  Laterality: N/A;  . ILEOColonoscopy  09/17/2011   SLF: Nl colonoscopy WMO/INTERNA; HEMORRHOIDS/ NO SOURCE FOR IRON DEFICIENCY ANEMA IDENTIFIED  . LAPAROSCOPIC APPENDECTOMY N/A 06/25/2018   Procedure: APPENDECTOMY LAPAROSCOPIC;  Surgeon: [name] Signs, MD;  Location: [facility];   Service: General;  Laterality: N/A;     Allergies  Allergen Reactions  . Sulfa Antibiotics Itching and Rash  . Tape Rash    Surgical tape causes blisters and rash      Family History  Problem Relation Age of Onset  . Colon cancer Maternal Aunt   . Anesthesia problems Neg Hx   . Hypotension Neg Hx   . Malignant hyperthermia Neg Hx   . Pseudochol deficiency Neg Hx   . Colon polyps Neg Hx      Social History [name] [name] reports that she has never smoked. She has never used smokeless tobacco. [name] [name] reports no history of alcohol use.   Review of Systems CONSTITUTIONAL: No weight loss, fever, chills, weakness or fatigue.  HEENT: Eyes: No visual loss, blurred vision, double vision or yellow sclerae.No hearing loss, sneezing, congestion, runny nose or sore throat.  SKIN: No rash or itching.  CARDIOVASCULAR: per hpi RESPIRATORY: No shortness of breath, cough or sputum.  GASTROINTESTINAL: No anorexia, nausea, vomiting or diarrhea. No abdominal pain or blood.  GENITOURINARY: No burning on urination, no polyuria NEUROLOGICAL: No headache, dizziness, syncope, paralysis, ataxia, numbness or tingling in the extremities. No change in bowel or bladder control.  MUSCULOSKELETAL: No muscle, back pain, joint pain or stiffness.  LYMPHATICS: No enlarged nodes. No history of splenectomy.  PSYCHIATRIC: No history of depression or anxiety.  ENDOCRINOLOGIC: No reports of sweating, cold or heat intolerance. No polyuria or polydipsia.  Marland Kitchen   Physical Examination Today's Vitals   09/21/20 1040  BP: (!) 152/80  Pulse: 82  SpO2: 96%  Weight: 126 lb 6.4 oz (57.3 kg)  Height: 5\' 5"  (1.651 m)   Body mass index is 21.03 kg/m.  Gen: resting comfortably, no acute distress HEENT: no scleral icterus, pupils equal round and reactive, no palptable cervical adenopathy,  CV: RRR, no m/r/g, no jvd Resp: Clear to auscultation bilaterally GI: abdomen is soft, non-tender, non-distended, normal bowel  sounds, no hepatosplenomegaly MSK: extremities are warm, no edema.  Skin: warm, no rash Neuro:  no focal deficits Psych: appropriate affect      Assessment and Plan  1. Chest pain - atypical symptoms, not consistent with cardiac ischemia - would monitor at this time, if significant change reconsider ischemic testing at that time - EKG today shows NSR, no ischemic changes  2. HTN - elevated in clinic but home bp's at goal, continue current meds  F/u as needed      Arnoldo Lenis, M.D.

## 2020-09-21 NOTE — Patient Instructions (Signed)
Medication Instructions:  Continue all current medications.  Labwork: none  Testing/Procedures: none  Follow-Up: As needed.    Any Other Special Instructions Will Be Listed Below (If Applicable).  If you need a refill on your cardiac medications before your next appointment, please call your pharmacy.  

## 2020-09-23 ENCOUNTER — Ambulatory Visit: Payer: PPO | Admitting: Cardiology

## 2020-10-13 ENCOUNTER — Ambulatory Visit: Payer: PPO | Admitting: Internal Medicine

## 2020-10-13 ENCOUNTER — Other Ambulatory Visit: Payer: Self-pay

## 2020-10-13 ENCOUNTER — Encounter: Payer: Self-pay | Admitting: Internal Medicine

## 2020-10-13 VITALS — BP 140/78 | HR 110 | Temp 97.7°F | Ht 65.0 in | Wt 128.0 lb

## 2020-10-13 DIAGNOSIS — K219 Gastro-esophageal reflux disease without esophagitis: Secondary | ICD-10-CM

## 2020-10-13 DIAGNOSIS — R1013 Epigastric pain: Secondary | ICD-10-CM | POA: Diagnosis not present

## 2020-10-13 NOTE — Progress Notes (Signed)
Referring Provider: Lemmie Evens, MD Primary Care Physician:  Lemmie Evens, MD Primary GI:  Dr. Abbey Chatters  Chief Complaint  Patient presents with  . Gastroesophageal Reflux    Doing better with Dexilant    HPI:   [name] [name] is a 70 y.o. female who presents to the clinic today for follow-up visit.  Previously seen for dysphagia, reflux, chest pain. She underwent EGD in August which showed gastritis, H. pylori negative.  She did have a slight esophageal narrowing so I dilated with a 20 mm balloon.  She states her symptoms improved status post dilation as well as Protonix 40 mg twice daily until November when she developed Covid.  She was evaluated by cardiology who felt this was not cardiac related.  I switched her to [place] and she states her symptoms are improved.  She does continue to eat a rather bland diet.  Past Medical History:  Diagnosis Date  . Breast cancer ([place])   . Functional dyspepsia MAR 2014   APR 2014: PT DECLINED BRAVO. PPIs-PROTOIX/PRILOSEC-->DEXILANT-->NEXIUM(TOO EXPENSIVE)  . GERD (gastroesophageal reflux disease)   . High cholesterol   . Hypertension     Past Surgical History:  Procedure Laterality Date  . BALLOON DILATION N/A 04/28/2020   Procedure: BALLOON DILATION;  Surgeon: Eloise Harman, DO;  [facility];  Service: Endoscopy;  Laterality: N/A;  . BIOPSY  04/28/2020   Procedure: BIOPSY;  Surgeon: Eloise Harman, DO;  [facility];  Service: Endoscopy;;  . BREAST LUMPECTOMY  2006   Left  breast  . BREAST LUMPECTOMY  1994   Right breast-benign  . CHOLECYSTECTOMY N/A 05/10/2016   Procedure: LAPAROSCOPIC CHOLECYSTECTOMY WITH INTRAOPERATIVE CHOLANGIOGRAM;  Surgeon: Aviva Signs, MD;  Location: [facility];  Service: General;  Laterality: N/A;  . COLONOSCOPY  2009   normal colon without evidence of polyps  . ESOPHAGOGASTRODUODENOSCOPY  2009   normal stomach,esophagues without mass,reosion  . ESOPHAGOGASTRODUODENOSCOPY   09/17/2011   SLF: Mild gastritis/NO SOURCE FOR IRON DEFICINECY ANEMIA IDENTIFIED  . ESOPHAGOGASTRODUODENOSCOPY N/A 12/02/2012   SLF: 1. No source for dyspepsia identified 2. MILD Non-erosive gastritis  . ESOPHAGOGASTRODUODENOSCOPY (EGD) WITH PROPOFOL N/A 04/28/2020   Procedure: ESOPHAGOGASTRODUODENOSCOPY (EGD) WITH PROPOFOL;  Surgeon: Eloise Harman, DO;  [facility];  Service: Endoscopy;  Laterality: N/A;  7:30am  . GIVENS CAPSULE STUDY  09/26/2011   Procedure: GIVENS CAPSULE STUDY;  Surgeon: Dorothyann Peng, MD;  [facility];  Service: Endoscopy;  Laterality: N/A;  . ILEOColonoscopy  09/17/2011   SLF: Nl colonoscopy WMO/INTERNA; HEMORRHOIDS/ NO SOURCE FOR IRON DEFICIENCY ANEMA IDENTIFIED  . LAPAROSCOPIC APPENDECTOMY N/A 06/25/2018   Procedure: APPENDECTOMY LAPAROSCOPIC;  Surgeon: Aviva Signs, MD;  Location: [facility];  Service: General;  Laterality: N/A;    Current Outpatient Medications  Medication Sig Dispense Refill  . atorvastatin (LIPITOR) 20 MG tablet Take 20 mg by mouth at bedtime.   0  . calcium carbonate (TUMS - DOSED IN MG ELEMENTAL CALCIUM) 500 MG chewable tablet Chew 1 tablet by mouth daily as needed for indigestion or heartburn.    . Calcium Carbonate-Vitamin D 600-400 MG-UNIT tablet Take 1 tablet by mouth 2 (two) times daily.     . clobetasol cream (TEMOVATE) 6.28 % Apply 1 application topically daily as needed (irritation).    Marland Kitchen dexlansoprazole (DEXILANT) 60 MG capsule Take 1 capsule (60 mg total) by mouth daily. 30 capsule 5  . docusate sodium (COLACE) 100 MG capsule Take 200 mg  by mouth at bedtime as needed for mild constipation.     . enalapril (VASOTEC) 10 MG tablet Take 10 mg by mouth daily.    Marland Kitchen LORazepam (ATIVAN) 0.5 MG tablet Take 0.5 mg by mouth at bedtime.    . valACYclovir (VALTREX) 500 MG tablet Take 500 mg by mouth as needed (for outbreaks).   12  . vitamin B-12 (CYANOCOBALAMIN) 1000 MCG tablet Take 2,000 mcg by mouth daily.     No current  facility-administered medications for this visit.    Allergies as of 10/13/2020 - Review Complete 10/13/2020  Allergen Reaction Noted  . Sulfa antibiotics Itching and Rash 05/09/2016  . Tape Rash 05/10/2016    Family History  Problem Relation Age of Onset  . Colon cancer Maternal Aunt   . Anesthesia problems Neg Hx   . Hypotension Neg Hx   . Malignant hyperthermia Neg Hx   . Pseudochol deficiency Neg Hx   . Colon polyps Neg Hx     Social History   Socioeconomic History  . Marital status: Married    Spouse name: Not on file  . Number of children: Not on file  . Years of education: Not on file  . Highest education level: Not on file  Occupational History  . Not on file  Tobacco Use  . Smoking status: Never Smoker  . Smokeless tobacco: Never Used  Vaping Use  . Vaping Use: Never used  Substance and Sexual Activity  . Alcohol use: No  . Drug use: No  . Sexual activity: Not on file  Other Topics Concern  . Not on file  Social History Narrative  . Not on file   Social Determinants of Health   Financial Resource Strain: Not on file  Food Insecurity: Not on file  Transportation Needs: Not on file  Physical Activity: Not on file  Stress: Not on file  Social Connections: Not on file    Subjective: Review of Systems  Constitutional: Negative for chills and fever.  HENT: Negative for congestion and hearing loss.   Eyes: Negative for blurred vision and double vision.  Respiratory: Negative for cough and shortness of breath.   Cardiovascular: Negative for chest pain and palpitations.  Gastrointestinal: Negative for abdominal pain, blood in stool, constipation, diarrhea, heartburn, melena and vomiting.  Genitourinary: Negative for dysuria and urgency.  Musculoskeletal: Negative for joint pain and myalgias.  Skin: Negative for itching and rash.  Neurological: Negative for dizziness and headaches.  Psychiatric/Behavioral: Negative for depression. The patient is not  nervous/anxious.      Objective: BP 140/78   Pulse (!) 110   Temp 97.7 F (36.5 C) (Temporal)   Ht 5\' 5"  (1.651 m)   Wt 128 lb (58.1 kg)   BMI 21.30 kg/m  Physical Exam Constitutional:      Appearance: Normal appearance.  HENT:     Head: Normocephalic and atraumatic.  Eyes:     Extraocular Movements: Extraocular movements intact.     Conjunctiva/sclera: Conjunctivae normal.  Cardiovascular:     Rate and Rhythm: Normal rate and regular rhythm.  Pulmonary:     Effort: Pulmonary effort is normal.     Breath sounds: Normal breath sounds.  Abdominal:     General: Bowel sounds are normal.     Palpations: Abdomen is soft.  Musculoskeletal:        General: No swelling. Normal range of motion.     Cervical back: Normal range of motion and neck supple.  Skin:  General: Skin is warm and dry.     Coloration: Skin is not jaundiced.  Neurological:     General: No focal deficit present.     Mental Status: She is alert and oriented to person, place, and time.  Psychiatric:        Mood and Affect: Mood normal.        Behavior: Behavior normal.      Assessment: *GERD *Dyspepsia  *Chest pain-improved *Dysphagia-resolved  Plan: Patient's symptoms are improved on Dexilant 60 mg daily.  She has been evaluated by cardiology who felt this is not cardiac related.  Patient does note the Dexilant is expensive and is inquiring about switching back to pantoprazole.  I recommended she continue Dexilant for another 4 weeks at which time she can switch to pantoprazole 40 mg twice daily for 4 to 6 weeks and then decrease to pantoprazole once daily thereafter.  If her symptoms return, we may have to switch back to [place] and she understands.  She can follow-up in 6 months or sooner if needed.  10/13/2020 10:16 AM   Disclaimer: This note was dictated with voice recognition software. Similar sounding words can inadvertently be transcribed and may not be corrected upon review.

## 2020-10-13 NOTE — Patient Instructions (Signed)
I want you to continue on Dexilant daily for the next 4 weeks.  At that time you can transition to pantoprazole 40 mg twice daily for 4 to 6 weeks and then decrease to once daily thereafter.  Hopefully your symptoms remain controlled.  If they return after stopping Dexilant, we may just need to switch back.  Hopefully by then the generic will be widely available and more affordable. follow-up in 6 months or sooner if needed  At [facility] we value your feedback. You may receive a survey about your visit today. Please share your experience as we strive to create trusting relationships with our patients to provide genuine, compassionate, quality care.  We appreciate your understanding and patience as we review any laboratory studies, imaging, and other diagnostic tests that are ordered as we care for you. Our office policy is 5 business days for review of these results, and any emergent or urgent results are addressed in a timely manner for your best interest. If you do not hear from our office in 1 week, please contact us.   We also encourage the use of MyChart, which contains your medical information for your review as well. If you are not enrolled in this feature, an access code is on this after visit summary for your convenience. Thank you for allowing [place] to be involved in your care.  It was great to see you today!  I hope you have a great rest of your winter!!    Elon Alas. Abbey Chatters, D.O. Gastroenterology and Hepatology [facility]

## 2020-10-20 ENCOUNTER — Ambulatory Visit: Payer: PPO | Admitting: Cardiology

## 2020-10-31 ENCOUNTER — Telehealth: Payer: Self-pay

## 2020-10-31 NOTE — Telephone Encounter (Signed)
Pt called with c/o Gerd symptom. Pt started Dexilant 60 mg as directed by Dr. Abbey Chatters 08/2021. Per pt, she discussed with Dr. Abbey Chatters going back on Pantoprazole when pt was feeling better. Pt feels she needs to belch and something feels it's stuck in her chest daily. Pt doesn't know if she needs to refill her Dexilant 60 mg this week or go back to taking the Pantoprazole. Gerd Symptoms not improved on Dexilant 60 mg.

## 2020-11-01 ENCOUNTER — Telehealth: Payer: Self-pay | Admitting: Internal Medicine

## 2020-11-01 NOTE — Telephone Encounter (Signed)
Lmom, waiting on a return call.  

## 2020-11-01 NOTE — Telephone Encounter (Signed)
noted 

## 2020-11-01 NOTE — Telephone Encounter (Signed)
Pt returned call. Pt is aware that she can go back to the Pantoprazole at this time. Pt will call back if Pantoprazole isn't working.

## 2020-11-01 NOTE — Telephone Encounter (Signed)
Okay for patient to just go back to her Pantoprazole. Thanks

## 2020-11-21 ENCOUNTER — Other Ambulatory Visit (HOSPITAL_COMMUNITY)
Admission: RE | Admit: 2020-11-21 | Discharge: 2020-11-21 | Disposition: A | Discharge status: Home / Self Care | Payer: PPO | Source: Ambulatory Visit | Attending: Nurse Practitioner | Admitting: Nurse Practitioner

## 2020-11-21 ENCOUNTER — Other Ambulatory Visit: Payer: Self-pay

## 2020-11-21 DIAGNOSIS — E7800 Pure hypercholesterolemia, unspecified: Secondary | ICD-10-CM | POA: Diagnosis not present

## 2020-11-21 DIAGNOSIS — I10 Essential (primary) hypertension: Secondary | ICD-10-CM | POA: Diagnosis not present

## 2020-11-21 DIAGNOSIS — Z79891 Long term (current) use of opiate analgesic: Secondary | ICD-10-CM | POA: Diagnosis not present

## 2020-11-21 LAB — COMPREHENSIVE METABOLIC PANEL
ALT: 15 U/L (ref 0–44)
AST: 22 U/L (ref 15–41)
Albumin: 4.2 g/dL (ref 3.5–5.0)
Alkaline Phosphatase: 59 U/L (ref 38–126)
Anion gap: 9 (ref 5–15)
BUN: 14 mg/dL (ref 8–23)
CO2: 27 mmol/L (ref 22–32)
Calcium: 9.7 mg/dL (ref 8.9–10.3)
Chloride: 104 mmol/L (ref 98–111)
Creatinine, Ser: 0.81 mg/dL (ref 0.44–1.00)
GFR, Estimated: >60 mL/min (ref >60)
Glucose, Bld: 105 mg/dL — ABNORMAL HIGH (ref 70–99)
Potassium: 4.7 mmol/L (ref 3.5–5.1)
Sodium: 140 mmol/L (ref 135–145)
Total Bilirubin: 0.6 mg/dL (ref 0.3–1.2)
Total Protein: 8 g/dL (ref 6.5–8.1)

## 2020-11-21 LAB — CBC
HCT: 38 % (ref 36.0–46.0)
Hemoglobin: 11.6 g/dL — ABNORMAL LOW (ref 12.0–15.0)
MCH: 26.1 pg (ref 26.0–34.0)
MCHC: 30.5 g/dL (ref 30.0–36.0)
MCV: 85.6 fL (ref 80.0–100.0)
Platelets: 215 10*3/uL (ref 150–400)
RBC: 4.44 MIL/uL (ref 3.87–5.11)
RDW: 14.9 % (ref 11.5–15.5)
WBC: 4.6 10*3/uL (ref 4.0–10.5)
nRBC: 0 % (ref 0.0–0.2)

## 2020-11-21 LAB — LIPID PANEL
Cholesterol: 157 mg/dL (ref 0–200)
HDL: 69 mg/dL (ref >40)
LDL Cholesterol: 61 mg/dL (ref 0–99)
Total CHOL/HDL Ratio: 2.3 RATIO
Triglycerides: 134 mg/dL (ref <150)
VLDL: 27 mg/dL (ref 0–40)

## 2020-11-21 LAB — VITAMIN B12: Vitamin B-12: 1348 pg/mL — ABNORMAL HIGH (ref 180–914)

## 2020-11-21 LAB — TSH: TSH: 2.621 u[IU]/mL (ref 0.350–4.500)

## 2020-11-24 NOTE — Telephone Encounter (Signed)
error 

## 2020-11-25 DIAGNOSIS — Z8616 Personal history of COVID-19: Secondary | ICD-10-CM | POA: Diagnosis not present

## 2020-11-25 DIAGNOSIS — K2970 Gastritis, unspecified, without bleeding: Secondary | ICD-10-CM | POA: Diagnosis not present

## 2020-11-25 DIAGNOSIS — I10 Essential (primary) hypertension: Secondary | ICD-10-CM | POA: Diagnosis not present

## 2020-11-25 DIAGNOSIS — D509 Iron deficiency anemia, unspecified: Secondary | ICD-10-CM | POA: Diagnosis not present

## 2020-12-01 DIAGNOSIS — R799 Abnormal finding of blood chemistry, unspecified: Secondary | ICD-10-CM | POA: Diagnosis not present

## 2020-12-01 DIAGNOSIS — D509 Iron deficiency anemia, unspecified: Secondary | ICD-10-CM | POA: Diagnosis not present

## 2020-12-05 DIAGNOSIS — R799 Abnormal finding of blood chemistry, unspecified: Secondary | ICD-10-CM | POA: Diagnosis not present

## 2020-12-05 DIAGNOSIS — D509 Iron deficiency anemia, unspecified: Secondary | ICD-10-CM | POA: Diagnosis not present

## 2021-02-09 DIAGNOSIS — L900 Lichen sclerosus et atrophicus: Secondary | ICD-10-CM | POA: Diagnosis not present

## 2021-02-09 DIAGNOSIS — Z6820 Body mass index (BMI) 20.0-20.9, adult: Secondary | ICD-10-CM | POA: Diagnosis not present

## 2021-02-09 DIAGNOSIS — N951 Menopausal and female climacteric states: Secondary | ICD-10-CM | POA: Diagnosis not present

## 2021-02-09 DIAGNOSIS — A609 Anogenital herpesviral infection, unspecified: Secondary | ICD-10-CM | POA: Diagnosis not present

## 2021-02-09 DIAGNOSIS — N941 Unspecified dyspareunia: Secondary | ICD-10-CM | POA: Diagnosis not present

## 2021-02-09 DIAGNOSIS — Z853 Personal history of malignant neoplasm of breast: Secondary | ICD-10-CM | POA: Diagnosis not present

## 2021-02-09 DIAGNOSIS — Z124 Encounter for screening for malignant neoplasm of cervix: Secondary | ICD-10-CM | POA: Diagnosis not present

## 2021-02-16 ENCOUNTER — Other Ambulatory Visit: Payer: Self-pay | Admitting: Obstetrics and Gynecology

## 2021-02-16 DIAGNOSIS — N644 Mastodynia: Secondary | ICD-10-CM

## 2021-03-08 ENCOUNTER — Ambulatory Visit
Admission: RE | Admit: 2021-03-08 | Discharge: 2021-03-08 | Disposition: A | Discharge status: Home / Self Care | Payer: PPO | Source: Ambulatory Visit | Attending: Obstetrics and Gynecology | Admitting: Obstetrics and Gynecology

## 2021-03-08 ENCOUNTER — Other Ambulatory Visit: Payer: Self-pay

## 2021-03-08 DIAGNOSIS — N644 Mastodynia: Secondary | ICD-10-CM

## 2021-03-08 DIAGNOSIS — R922 Inconclusive mammogram: Secondary | ICD-10-CM | POA: Diagnosis not present

## 2021-03-08 DIAGNOSIS — Z853 Personal history of malignant neoplasm of breast: Secondary | ICD-10-CM | POA: Diagnosis not present

## 2021-03-28 ENCOUNTER — Encounter: Payer: Self-pay | Admitting: Internal Medicine

## 2021-04-06 DIAGNOSIS — D509 Iron deficiency anemia, unspecified: Secondary | ICD-10-CM | POA: Diagnosis not present

## 2021-04-06 DIAGNOSIS — I10 Essential (primary) hypertension: Secondary | ICD-10-CM | POA: Diagnosis not present

## 2021-04-17 ENCOUNTER — Other Ambulatory Visit: Payer: Self-pay | Admitting: Gastroenterology

## 2021-05-18 ENCOUNTER — Other Ambulatory Visit (HOSPITAL_COMMUNITY): Payer: Self-pay | Admitting: Family Medicine

## 2021-05-18 DIAGNOSIS — Z1231 Encounter for screening mammogram for malignant neoplasm of breast: Secondary | ICD-10-CM

## 2021-05-24 ENCOUNTER — Ambulatory Visit: Payer: PPO | Admitting: Internal Medicine

## 2021-05-29 ENCOUNTER — Ambulatory Visit (HOSPITAL_COMMUNITY)
Admission: RE | Admit: 2021-05-29 | Discharge: 2021-05-29 | Disposition: A | Discharge status: Home / Self Care | Payer: PPO | Source: Ambulatory Visit | Attending: Family Medicine | Admitting: Family Medicine

## 2021-05-29 ENCOUNTER — Other Ambulatory Visit: Payer: Self-pay

## 2021-05-29 DIAGNOSIS — Z1231 Encounter for screening mammogram for malignant neoplasm of breast: Secondary | ICD-10-CM | POA: Insufficient documentation

## 2021-07-05 ENCOUNTER — Ambulatory Visit (INDEPENDENT_AMBULATORY_CARE_PROVIDER_SITE_OTHER): Payer: PPO | Admitting: Internal Medicine

## 2021-07-05 ENCOUNTER — Other Ambulatory Visit: Payer: Self-pay

## 2021-07-05 ENCOUNTER — Encounter: Payer: Self-pay | Admitting: Internal Medicine

## 2021-07-05 VITALS — BP 146/81 | HR 108 | Temp 97.1°F | Ht 65.0 in | Wt 127.8 lb

## 2021-07-05 DIAGNOSIS — R1013 Epigastric pain: Secondary | ICD-10-CM | POA: Diagnosis not present

## 2021-07-05 DIAGNOSIS — K219 Gastro-esophageal reflux disease without esophagitis: Secondary | ICD-10-CM | POA: Diagnosis not present

## 2021-07-05 DIAGNOSIS — Z1211 Encounter for screening for malignant neoplasm of colon: Secondary | ICD-10-CM | POA: Diagnosis not present

## 2021-07-05 MED ORDER — SUCRALFATE 1 G PO TABS: 1.0000 g | ORAL_TABLET | Freq: Three times a day (TID) | ORAL | 5 refills | Status: DC

## 2021-07-05 NOTE — Progress Notes (Signed)
Referring Provider: Lemmie Evens, MD Primary Care Physician:  Lemmie Evens, MD Primary GI:  Dr. Abbey Chatters  Chief Complaint  Patient presents with   Gastroesophageal Reflux    Ok now, but has had reflux since last visit. Wants to discuss if she should restart Iron    HPI:   [name] [name] is a 70 y.o. female who presents to the clinic today for follow-up visit.  Previously seen for dysphagia, reflux, chest pain. She underwent EGD in August which showed gastritis, H. pylori negative.  She did have a slight esophageal narrowing so I dilated with a 20 mm balloon.  She states her symptoms improved status post dilation as well as Protonix 40 mg twice daily until November when she developed Covid.  She was evaluated by cardiology who felt this was not cardiac related.  Was on Previously though this medication was too expensive so she went back to pantoprazole.  Today, she states her GI symptoms are relatively well controlled.  She does have some issues with epigastric pain at times.  Routine blood work found her to be slightly anemic with hemoglobin 11.6.  She states she had iron studies done as well which showed low iron.  I do not see these however.  She was started on oral iron replacement and states her hemoglobin improved to >13.  Notes having Hemoccult testing which was negative.  Last colonoscopy 2013 unremarkable.    Past Medical History:  Diagnosis Date   Breast cancer ([place])    Functional dyspepsia MAR 2014   APR 2014: PT DECLINED BRAVO. PPIs-PROTOIX/PRILOSEC-->DEXILANT-->NEXIUM(TOO EXPENSIVE)   GERD (gastroesophageal reflux disease)    High cholesterol    Hypertension     Past Surgical History:  Procedure Laterality Date   BALLOON DILATION N/A 04/28/2020   Procedure: BALLOON DILATION;  Surgeon: Eloise Harman, DO;  [facility];  Service: Endoscopy;  Laterality: N/A;   BIOPSY  04/28/2020   Procedure: BIOPSY;  Surgeon: Eloise Harman, DO;  [facility];  Service: Endoscopy;;   BREAST LUMPECTOMY  2006   Left  breast   BREAST LUMPECTOMY  1994   Right breast-benign   CHOLECYSTECTOMY N/A 05/10/2016   Procedure: LAPAROSCOPIC CHOLECYSTECTOMY WITH INTRAOPERATIVE CHOLANGIOGRAM;  Surgeon: Aviva Signs, MD;  Location: [facility];  Service: General;  Laterality: N/A;   COLONOSCOPY  2009   normal colon without evidence of polyps   ESOPHAGOGASTRODUODENOSCOPY  2009   normal stomach,esophagues without mass,reosion   ESOPHAGOGASTRODUODENOSCOPY  09/17/2011   SLF: Mild gastritis/NO SOURCE FOR IRON DEFICINECY ANEMIA IDENTIFIED   ESOPHAGOGASTRODUODENOSCOPY N/A 12/02/2012   SLF: 1. No source for dyspepsia identified 2. MILD Non-erosive gastritis   ESOPHAGOGASTRODUODENOSCOPY (EGD) WITH PROPOFOL N/A 04/28/2020   Procedure: ESOPHAGOGASTRODUODENOSCOPY (EGD) WITH PROPOFOL;  Surgeon: Eloise Harman, DO;  [facility];  Service: Endoscopy;  Laterality: N/A;  7:30am   GIVENS CAPSULE STUDY  09/26/2011   Procedure: GIVENS CAPSULE STUDY;  Surgeon: Dorothyann Peng, MD;  [facility];  Service: Endoscopy;  Laterality: N/A;   ILEOColonoscopy  09/17/2011   SLF: Nl colonoscopy WMO/INTERNA; HEMORRHOIDS/ NO SOURCE FOR IRON DEFICIENCY ANEMA IDENTIFIED   LAPAROSCOPIC APPENDECTOMY N/A 06/25/2018   Procedure: APPENDECTOMY LAPAROSCOPIC;  Surgeon: Aviva Signs, MD;  Location: [facility];  Service: General;  Laterality: N/A;    Current Outpatient Medications  Medication Sig Dispense Refill   atorvastatin (LIPITOR) 20 MG tablet Take 20 mg by mouth at bedtime.   0   calcium carbonate (  TUMS - DOSED IN MG ELEMENTAL CALCIUM) 500 MG chewable tablet Chew 1 tablet by mouth daily as needed for indigestion or heartburn.     Calcium Carbonate-Vitamin D 600-400 MG-UNIT tablet Take 1 tablet by mouth daily.     clobetasol cream (TEMOVATE) 7.61 % Apply 1 application topically daily as needed (irritation).     docusate sodium (COLACE) 100 MG capsule Take 200 mg by mouth at  bedtime as needed for mild constipation.      enalapril (VASOTEC) 10 MG tablet Take 10 mg by mouth daily.     LORazepam (ATIVAN) 0.5 MG tablet Take 0.5 mg by mouth at bedtime.     pantoprazole (PROTONIX) 40 MG tablet TAKE 1 TABLET(40 MG) BY MOUTH TWICE DAILY BEFORE A MEAL 60 tablet 5   valACYclovir (VALTREX) 500 MG tablet Take 500 mg by mouth as needed (for outbreaks).   12   vitamin B-12 (CYANOCOBALAMIN) 1000 MCG tablet Take 2,000 mcg by mouth 3 (three) times a week.     No current facility-administered medications for this visit.    Allergies as of 07/05/2021 - Review Complete 07/05/2021  Allergen Reaction Noted   Sulfa antibiotics Itching and Rash 05/09/2016   Tape Rash 05/10/2016    Family History  Problem Relation Age of Onset   Colon cancer Maternal Aunt    Anesthesia problems Neg Hx    Hypotension Neg Hx    Malignant hyperthermia Neg Hx    Pseudochol deficiency Neg Hx    Colon polyps Neg Hx     Social History   Socioeconomic History   Marital status: Married    Spouse name: Not on file   Number of children: Not on file   Years of education: Not on file   Highest education level: Not on file  Occupational History   Not on file  Tobacco Use   Smoking status: Never   Smokeless tobacco: Never  Vaping Use   Vaping Use: Never used  Substance and Sexual Activity   Alcohol use: No   Drug use: No   Sexual activity: Not on file  Other Topics Concern   Not on file  Social History Narrative   Not on file   Social Determinants of Health   Financial Resource Strain: Not on file  Food Insecurity: Not on file  Transportation Needs: Not on file  Physical Activity: Not on file  Stress: Not on file  Social Connections: Not on file    Subjective: Review of Systems  Constitutional:  Negative for chills and fever.  HENT:  Negative for congestion and hearing loss.   Eyes:  Negative for blurred vision and double vision.  Respiratory:  Negative for cough and shortness  of breath.   Cardiovascular:  Negative for chest pain and palpitations.  Gastrointestinal:  Negative for abdominal pain, blood in stool, constipation, diarrhea, heartburn, melena and vomiting.  Genitourinary:  Negative for dysuria and urgency.  Musculoskeletal:  Negative for joint pain and myalgias.  Skin:  Negative for itching and rash.  Neurological:  Negative for dizziness and headaches.  Psychiatric/Behavioral:  Negative for depression. The patient is not nervous/anxious.     Objective: BP (!) 146/81   Pulse (!) 108   Temp (!) 97.1 F (36.2 C) (Temporal)   Ht 5\' 5"  (1.651 m)   Wt 127 lb 12.8 oz (58 kg)   BMI 21.27 kg/m  Physical Exam Constitutional:      Appearance: Normal appearance.  HENT:     Head: Normocephalic  and atraumatic.  Eyes:     Extraocular Movements: Extraocular movements intact.     Conjunctiva/sclera: Conjunctivae normal.  Cardiovascular:     Rate and Rhythm: Normal rate and regular rhythm.  Pulmonary:     Effort: Pulmonary effort is normal.     Breath sounds: Normal breath sounds.  Abdominal:     General: Bowel sounds are normal.     Palpations: Abdomen is soft.  Musculoskeletal:        General: No swelling. Normal range of motion.     Cervical back: Normal range of motion and neck supple.  Skin:    General: Skin is warm and dry.     Coloration: Skin is not jaundiced.  Neurological:     General: No focal deficit present.     Mental Status: She is alert and oriented to person, place, and time.  Psychiatric:        Mood and Affect: Mood normal.        Behavior: Behavior normal.     Assessment: *GERD *Dyspepsia  *Iron deficiency  Plan: Patient's symptoms appear to be relatively well controlled on pantoprazole 40 mg twice daily.  We will continue.  We have tried to wean down to once daily and have been unable to in the past due to symptom breakthrough.  I will send in Carafate today to take as needed for breakthrough symptoms.  Possible her  oral iron is making her GI symptoms worse.  Recommended she discuss with her PCP potential iron infusions as an alternative if she needs ongoing iron supplementation.  We will plan on colonoscopy for colon cancer screening purposes January 2023.     07/05/2021 10:12 AM   Disclaimer: This note was dictated with voice recognition software. Similar sounding words can inadvertently be transcribed and may not be corrected upon review.

## 2021-07-05 NOTE — Patient Instructions (Addendum)
Continue on pantoprazole twice daily for your chronic reflux and abdominal pain.  I will send in Carafate to take on top of this as needed for breakthrough symptoms.  You can take this medication up to 4 times a day.  We will plan on colonoscopy for colon cancer screening in January.  I will have the office staff ensure that your husband Fritz Pickerel is also on our recall list.  Please have your PCP fax over your most recent iron studies.  It was great seeing you again today.  Dr. Abbey Chatters  At [facility] we value your feedback. You may receive a survey about your visit today. Please share your experience as we strive to create trusting relationships with our patients to provide genuine, compassionate, quality care.  We appreciate your understanding and patience as we review any laboratory studies, imaging, and other diagnostic tests that are ordered as we care for you. Our office policy is 5 business days for review of these results, and any emergent or urgent results are addressed in a timely manner for your best interest. If you do not hear from our office in 1 week, please contact us.   We also encourage the use of MyChart, which contains your medical information for your review as well. If you are not enrolled in this feature, an access code is on this after visit summary for your convenience. Thank you for allowing [place] to be involved in your care.  It was great to see you today!  I hope you have a great rest of your Fall!    Elon Alas. Abbey Chatters, D.O. Gastroenterology and Hepatology [facility]

## 2021-07-10 ENCOUNTER — Telehealth: Payer: Self-pay | Admitting: Internal Medicine

## 2021-07-10 NOTE — Telephone Encounter (Signed)
Patient wanted to make sure dr carver got her iron results from Dr. Vickey Sages office. Said to let her know if dr carver suggests her to do something different.

## 2021-07-10 NOTE — Telephone Encounter (Signed)
Phoned and advised pt that Dr. Abbey Chatters did have her blood work and once he reviews it ,if there is any instructions/recommendations from him we would call her. The pt expressed understanding

## 2021-07-18 ENCOUNTER — Telehealth: Payer: Self-pay

## 2021-07-18 NOTE — Telephone Encounter (Signed)
This pt phoned and LMOVM regarding the decision she made about having her colonoscopy in January. Pt now is questioning it because she has been having pale colored stools. No bleeding in stools. But every since she has came off her iron pills her stools have been really pale. She had a ov with you on 07/05/2021 (few weeks ago). Wants your opinion on what she should do and to discuss some options regarding it. Please call her @ [phone].

## 2021-07-19 NOTE — Telephone Encounter (Signed)
Called and  spoke with patient.  Since stopping her iron supplementation, her stool color has changed to light brown/yellow.  Does tend to be on the looser side averaging 1-2 bowel movements a day.  She is status post cholecystectomy.  I think it is okay to just watch for now.  Proceed with colonoscopy in January for screening purposes as previously scheduled.  Patient is agreeable.

## 2021-08-29 ENCOUNTER — Encounter: Payer: Self-pay | Admitting: *Deleted

## 2021-09-20 ENCOUNTER — Telehealth: Payer: Self-pay | Admitting: Internal Medicine

## 2021-09-20 NOTE — Telephone Encounter (Signed)
Patient called and said that she is answering her question sheet for her triage and had a question.  She wanted to know if an office visit was needed because she sometimes does not go the same time every day and it is not always the same consistency.  No bleeding, no pain, and the stools are formed.  I told her that I did not think this would warrant an office visit but I would pass the question to the triage nurse.

## 2021-09-20 NOTE — Telephone Encounter (Signed)
Spoke to pt.  No constipation, diarrhea, or rectal bleeding.  Still going to bathroom regularly and consistency is firmer.  She has just started eating more.   No issues or problems.  Pt advised to turn in paperwork.  No need for ov.  Dr. Abbey Chatters:  Pt wanted to know if it is ok to take Carafate at bedtime?  She said that she read instructions in packet and it states it is best to take on an empty stomach 2 hours before meals.  She wants to know if it needs to be taken on an empty stomach.

## 2021-09-21 NOTE — Telephone Encounter (Signed)
Yes okay for patient to take prior to bedtime.  Thank you

## 2021-09-22 NOTE — Telephone Encounter (Signed)
Called pt and made her aware.  She voiced understanding.

## 2021-10-03 ENCOUNTER — Encounter: Payer: Self-pay | Admitting: *Deleted

## 2021-10-16 ENCOUNTER — Other Ambulatory Visit: Payer: Self-pay | Admitting: Gastroenterology

## 2021-11-20 ENCOUNTER — Ambulatory Visit (INDEPENDENT_AMBULATORY_CARE_PROVIDER_SITE_OTHER): Payer: Self-pay | Admitting: *Deleted

## 2021-11-20 ENCOUNTER — Other Ambulatory Visit: Payer: Self-pay

## 2021-11-20 VITALS — Ht 64.0 in | Wt 126.0 lb

## 2021-11-20 DIAGNOSIS — Z1211 Encounter for screening for malignant neoplasm of colon: Secondary | ICD-10-CM

## 2021-11-20 NOTE — Progress Notes (Addendum)
Pt wants to know if she will need EGD.  Still having empty weak feeling in stomach.  Would rather have EGD and TCS together if it is needed.  Pt is going to start taking Sucralfate at night to see if this will help.  Spoke to pharmacist about it. ?

## 2021-11-20 NOTE — Progress Notes (Signed)
Gastroenterology Pre-Procedure Review ? ?Request Date: 11/20/2021 ?Requesting Physician: 10 year recall, Last TCS done 09/17/2011 by Dr. Oneida Alar, normal colon, internal hemorrhoids, family hx of colon cancer (aunt and cousin) ? ?PATIENT REVIEW QUESTIONS: The patient responded to the following health history questions as indicated:   ? ?1. Diabetes Melitis: no ?2. Joint replacements in the past 12 months: no ?3. Major health problems in the past 3 months: no ?4. Has an artificial valve or MVP: no ?5. Has a defibrillator: no ?6. Has been advised in past to take antibiotics in advance of a procedure like teeth cleaning: no ?7. Family history of colon cancer: yes, aunt: age 55's, cousin: age unknown  ?8. Alcohol Use: no ?9. Illicit drug Use: no ?10. History of sleep apnea: no  ?11. History of coronary artery or other vascular stents placed within the last 12 months: no ?12. History of any prior anesthesia complications: yes, had paralysis feeling that last only a couple of minutes -30 years ago ?13. Body mass index is 21.63 kg/m?. ?   ?MEDICATIONS & ALLERGIES:    ?Patient reports the following regarding taking any blood thinners:   ?Plavix? no ?Aspirin? no ?Coumadin? no ?Brilinta? no ?Xarelto? no ?Eliquis? no ?Pradaxa? no ?Savaysa? no ?Effient?  no ? ?Patient confirms/reports the following medications:  ?Current Outpatient Medications  ?Medication Sig Dispense Refill  ? acetaminophen (TYLENOL) 325 MG tablet Take 650 mg by mouth as needed.    ? alum & mag hydroxide-simeth (MYLANTA) 200-200-20 MG/5ML suspension Take by mouth daily at 6 (six) AM.    ? atorvastatin (LIPITOR) 20 MG tablet Take 20 mg by mouth at bedtime.   0  ? calcium carbonate (TUMS - DOSED IN MG ELEMENTAL CALCIUM) 500 MG chewable tablet Chew 1 tablet by mouth daily as needed for indigestion or heartburn.    ? Calcium Carbonate-Vitamin D 600-400 MG-UNIT tablet Take 1 tablet by mouth daily.    ? clobetasol cream (TEMOVATE) 8.36 % Apply 1 application topically  daily as needed (irritation).    ? docusate sodium (COLACE) 100 MG capsule Take 200 mg by mouth as needed for mild constipation.    ? enalapril (VASOTEC) 10 MG tablet Take 10 mg by mouth daily.    ? LORazepam (ATIVAN) 0.5 MG tablet Take 0.5 mg by mouth at bedtime.    ? oxymetazoline (AFRIN NASAL SPRAY) 0.05 % nasal spray Place 1 spray into both nostrils as needed for congestion.    ? pantoprazole (PROTONIX) 40 MG tablet TAKE 1 TABLET(40 MG) BY MOUTH TWICE DAILY BEFORE A MEAL 60 tablet 5  ? sucralfate (CARAFATE) 1 g tablet Take 1 tablet (1 g total) by mouth 4 (four) times daily -  with meals and at bedtime. (Patient taking differently: Take 1 g by mouth as needed.) 120 tablet 5  ? valACYclovir (VALTREX) 500 MG tablet Take 500 mg by mouth as needed (for outbreaks).   12  ? vitamin B-12 (CYANOCOBALAMIN) 1000 MCG tablet Take 2,000 mcg by mouth 3 (three) times a week.    ? ?No current facility-administered medications for this visit.  ? ? ?Patient confirms/reports the following allergies:  ?Allergies  ?Allergen Reactions  ? Sulfa Antibiotics Itching and Rash  ? Tape Rash  ?  Surgical tape causes blisters and rash  ? ? ?No orders of the defined types were placed in this encounter. ? ? ?AUTHORIZATION INFORMATION ?Primary Insurance: [facility],  ID #: [id number],  Group #: [id number] ?Pre-Cert / Josem Kaufmann required: No, not required ? ?SCHEDULE  INFORMATION: ?Procedure has been scheduled as follows:  ?Date: , Time:   ?Location: [facility] with Dr. Abbey Chatters ? ?This Gastroenterology Pre-Precedure Review Form is being routed to the following provider(s): Roseanne Kaufman, NP ?  ?

## 2021-11-30 NOTE — Progress Notes (Signed)
She will need office visit to discuss that.  ?

## 2021-12-01 NOTE — Progress Notes (Signed)
Called pt.  Lmom to see if pt would like to come in on 12/05/2021 at 9:30.   ?

## 2021-12-05 ENCOUNTER — Encounter: Payer: Self-pay | Admitting: Gastroenterology

## 2021-12-05 ENCOUNTER — Ambulatory Visit (INDEPENDENT_AMBULATORY_CARE_PROVIDER_SITE_OTHER): Payer: PPO | Admitting: Gastroenterology

## 2021-12-05 ENCOUNTER — Ambulatory Visit: Payer: PPO | Admitting: Gastroenterology

## 2021-12-05 VITALS — BP 129/76 | HR 90 | Temp 96.9°F | Ht 65.0 in | Wt 128.0 lb

## 2021-12-05 DIAGNOSIS — Z1211 Encounter for screening for malignant neoplasm of colon: Secondary | ICD-10-CM

## 2021-12-05 DIAGNOSIS — K219 Gastro-esophageal reflux disease without esophagitis: Secondary | ICD-10-CM

## 2021-12-05 NOTE — Progress Notes (Signed)
? ?GI Office Note   ? ?Referring Provider: Lemmie Evens, MD ?Primary Care Physician:  Lemmie Evens, MD ?Primary GI: Dr. Abbey Chatters ? ?Date:  12/05/2021  ?ID:  [name] [name], DOB 09-Nov-1950, MRN [id number] ? ? ?Chief Complaint  ? ?Chief Complaint  ?Patient presents with  ? Colonoscopy  ?  EGD, abdominal pain  ? ? ? ?History of Present Illness  ?[name] [name] is a 71 y.o. female with a history of breast cancer, GERD hypercholesteremia, HTN presenting today  ? ?Last colonoscopy 09/17/2011 -normal colonoscopy, small internal hemorrhoids.  Repeat in 10 years ? ?Last EGD 04/28/20 -benign-appearing esophageal stenosis s/p dilation, gastritis (negative H. Pylori), normal duodenum ? ?Last seen in the office November 2022 -she was seen for GERD that is fairly well controlled on pantoprazole 40 mg twice daily. She reported some intermittent epigastric pain and stated that she is taking oral iron replacement Hemoccult testing was negative.  She was given Carafate to use for breakthrough symptoms. ? ?Today:  ?Patient reports that she has continued to take pantoprazole 40 mg twice daily however she has had some breakthrough symptoms in her stomach's been feeling " weak" at night.  She describes this as a hunger type feeling in her stomach just does not feel good.  She has taken Mylanta occasionally and has had some improvement in nighttime, however will sometimes wakes up with the same feeling.  She has tried Dexilant in the past however it did not seem to be very helpful, antiemetic patient was fairly expensive.  She reports she has been trying to stay away from her food triggers however it makes it on enjoyable to eat meals at times due to her restrictions.  She has been staying away from coffee, chocolate, fried/greasy foods.  She does report some occasional throat clearing slimy stuff in her throat however is not as bad now as it was prior to her last EGD.  She reports that she has seen ENT for this as well and did not find a  cause for her symptoms.  She denies any melena, hematochezia, hematemesis, dysphagia, abdominal pain, diarrhea early satiety, lack of appetite, or weight loss.  She has reported some very seldom constipation for which she takes Colace at home and this relieves it.  Her normal bowel pattern is a soft formed bowel movement daily. ? ?She had questions about her Carafate and how to take it.  She reported that the pharmacist told her to take it on an empty stomach and not within an hour or 2 with other medications she was told by Dr. Abbey Chatters to take it with meals up to 4 times a day.  ? ? ?Past Medical History:  ?Diagnosis Date  ? Breast cancer ([place])   ? Functional dyspepsia MAR 2014  ? APR 2014: PT DECLINED BRAVO. PPIs-PROTOIX/PRILOSEC-->DEXILANT-->NEXIUM(TOO EXPENSIVE)  ? GERD (gastroesophageal reflux disease)   ? High cholesterol   ? Hypertension   ? ? ?Past Surgical History:  ?Procedure Laterality Date  ? BALLOON DILATION N/A 04/28/2020  ? Procedure: BALLOON DILATION;  Surgeon: Eloise Harman, DO;  [facility];  Service: Endoscopy;  Laterality: N/A;  ? BIOPSY  04/28/2020  ? Procedure: BIOPSY;  Surgeon: Eloise Harman, DO;  [facility];  Service: Endoscopy;;  ? BREAST LUMPECTOMY  2006  ? Left  breast  ? BREAST LUMPECTOMY  1994  ? Right breast-benign  ? CHOLECYSTECTOMY N/A 05/10/2016  ? Procedure: LAPAROSCOPIC CHOLECYSTECTOMY WITH INTRAOPERATIVE CHOLANGIOGRAM;  Surgeon: Aviva Signs,  MD;  Location: [facility];  Service: General;  Laterality: N/A;  ? COLONOSCOPY  2009  ? normal colon without evidence of polyps  ? ESOPHAGOGASTRODUODENOSCOPY  2009  ? normal stomach,esophagues without mass,reosion  ? ESOPHAGOGASTRODUODENOSCOPY  09/17/2011  ? SLF: Mild gastritis/NO SOURCE FOR IRON DEFICINECY ANEMIA IDENTIFIED  ? ESOPHAGOGASTRODUODENOSCOPY N/A 12/02/2012  ? SLF: 1. No source for dyspepsia identified 2. MILD Non-erosive gastritis  ? ESOPHAGOGASTRODUODENOSCOPY (EGD) WITH PROPOFOL N/A 04/28/2020  ? Procedure:  ESOPHAGOGASTRODUODENOSCOPY (EGD) WITH PROPOFOL;  Surgeon: Eloise Harman, DO;  [facility];  Service: Endoscopy;  Laterality: N/A;  7:30am  ? GIVENS CAPSULE STUDY  09/26/2011  ? Procedure: GIVENS CAPSULE STUDY;  Surgeon: Dorothyann Peng, MD;  [facility];  Service: Endoscopy;  Laterality: N/A;  ? ILEOColonoscopy  09/17/2011  ? SLF: Nl colonoscopy WMO/INTERNA; HEMORRHOIDS/ NO SOURCE FOR IRON DEFICIENCY ANEMA IDENTIFIED  ? LAPAROSCOPIC APPENDECTOMY N/A 06/25/2018  ? Procedure: APPENDECTOMY LAPAROSCOPIC;  Surgeon: Aviva Signs, MD;  Location: [facility];  Service: General;  Laterality: N/A;  ? ? ?Current Outpatient Medications  ?Medication Sig Dispense Refill  ? acetaminophen (TYLENOL) 325 MG tablet Take 650 mg by mouth as needed.    ? alum & mag hydroxide-simeth (MAALOX/MYLANTA) 200-200-20 MG/5ML suspension Take by mouth daily at 6 (six) AM.    ? atorvastatin (LIPITOR) 20 MG tablet Take 20 mg by mouth at bedtime.   0  ? calcium carbonate (TUMS - DOSED IN MG ELEMENTAL CALCIUM) 500 MG chewable tablet Chew 1 tablet by mouth daily as needed for indigestion or heartburn.    ? Calcium Carbonate-Vitamin D 600-400 MG-UNIT tablet Take 1 tablet by mouth daily.    ? clobetasol cream (TEMOVATE) 8.75 % Apply 1 application topically daily as needed (irritation).    ? docusate sodium (COLACE) 100 MG capsule Take 200 mg by mouth as needed for mild constipation.    ? enalapril (VASOTEC) 10 MG tablet Take 10 mg by mouth daily.    ? LORazepam (ATIVAN) 0.5 MG tablet Take 0.5 mg by mouth at bedtime.    ? oxymetazoline (AFRIN) 0.05 % nasal spray Place 1 spray into both nostrils as needed for congestion.    ? pantoprazole (PROTONIX) 40 MG tablet TAKE 1 TABLET(40 MG) BY MOUTH TWICE DAILY BEFORE A MEAL 60 tablet 5  ? sucralfate (CARAFATE) 1 g tablet Take 1 tablet (1 g total) by mouth 4 (four) times daily -  with meals and at bedtime. (Patient taking differently: Take 1 g by mouth as needed.) 120 tablet 5  ? valACYclovir  (VALTREX) 500 MG tablet Take 500 mg by mouth as needed (for outbreaks).   12  ? vitamin B-12 (CYANOCOBALAMIN) 1000 MCG tablet Take 2,000 mcg by mouth 3 (three) times a week.    ? ?No current facility-administered medications for this visit.  ? ? ?Allergies as of 12/05/2021 - Review Complete 12/05/2021  ?Allergen Reaction Noted  ? Sulfa antibiotics Itching and Rash 05/09/2016  ? Tape Rash 05/10/2016  ? ? ?Family History  ?Problem Relation Age of Onset  ? Colon cancer Maternal Aunt   ? Cancer - Colon Cousin   ? Anesthesia problems Neg Hx   ? Hypotension Neg Hx   ? Malignant hyperthermia Neg Hx   ? Pseudochol deficiency Neg Hx   ? Colon polyps Neg Hx   ? ? ?Social History  ? ?Socioeconomic History  ? Marital status: Married  ?  Spouse name: Not on file  ? Number of children: Not  on file  ? Years of education: Not on file  ? Highest education level: Not on file  ?Occupational History  ? Not on file  ?Tobacco Use  ? Smoking status: Never  ? Smokeless tobacco: Never  ?Vaping Use  ? Vaping Use: Never used  ?Substance and Sexual Activity  ? Alcohol use: No  ? Drug use: No  ? Sexual activity: Not on file  ?Other Topics Concern  ? Not on file  ?Social History Narrative  ? Not on file  ? ?Social Determinants of Health  ? ?Financial Resource Strain: Not on file  ?Food Insecurity: Not on file  ?Transportation Needs: Not on file  ?Physical Activity: Not on file  ?Stress: Not on file  ?Social Connections: Not on file  ? ? ? ?Review of Systems  ? ?Gen: Denies fever, chills, anorexia. Denies fatigue, weakness, weight loss.  ?CV: Denies chest pain, palpitations, syncope, peripheral edema, and claudication. ?Resp: Denies dyspnea at rest, cough, wheezing, coughing up blood, and pleurisy. ?GI: see HPI ?Derm: Denies rash, itching, dry skin ?Psych: Denies depression, anxiety, memory loss, confusion. No homicidal or suicidal ideation.  ?Heme: Denies bruising, bleeding, and enlarged lymph nodes. ? ? ?Physical Exam  ? ?BP 129/76   Pulse 90    Temp (!) 96.9 ?F (36.1 ?C) (Temporal)   Ht '5\' 5"'$  (1.651 m)   Wt 128 lb (58.1 kg)   BMI 21.30 kg/m?  ? ?General:   Alert and oriented. No distress noted. Pleasant and cooperative.  ?Head:  Normocephalic

## 2021-12-05 NOTE — Patient Instructions (Addendum)
It was a pleasure to meet you today.  ? ?We are scheduling you for a colonoscopy in the near future with Dr. Abbey Chatters.  ? ?I recommend using the carafate 3-4 times a day as needed. Would take the first dose after breakfast and dinner. Then take prior to bed. I would try this for a few weeks then call with a progress report. We can try switching to Aciphex if it is not helpful. Continue dietary restrictions and avoiding trigger foods as much as possible.  ? ?Follow up in 4 months post procedure.  ? ?It was a pleasure to see you today. I want to create trusting relationships with patients. If you receive a survey regarding your visit,  I greatly appreciate you taking time to fill this out on paper or through your MyChart. I value your feedback. ? ?Venetia Night, MSN, FNP-BC, AGACNP-BC ?[facility] ? ? ?

## 2021-12-06 ENCOUNTER — Other Ambulatory Visit: Payer: Self-pay

## 2021-12-06 ENCOUNTER — Telehealth: Payer: Self-pay

## 2021-12-06 MED ORDER — PEG 3350-KCL-NA BICARB-NACL 420 G PO SOLR
4000.0000 mL | ORAL | 0 refills | Status: DC
Start: 2021-12-06 — End: 2022-01-19

## 2021-12-06 NOTE — Telephone Encounter (Signed)
Called pt, TCS ASA 2 w/Dr. Abbey Chatters scheduled for 01/19/22 at 7:30am. Rx for prep sent to pharmacy. Orders entered. Instructions mailed. ?

## 2022-01-08 ENCOUNTER — Telehealth: Payer: Self-pay | Admitting: *Deleted

## 2022-01-08 NOTE — Telephone Encounter (Signed)
Informed pt of recommendations. ?

## 2022-01-08 NOTE — Telephone Encounter (Signed)
Spoke to pt, she was wondering if she could stop taking Sucralfate and only take as needed. She states she has been taking for a couple weeks now and it is helping. But, does not want to take everyday.  ?

## 2022-01-16 ENCOUNTER — Telehealth: Payer: Self-pay | Admitting: *Deleted

## 2022-01-16 NOTE — Telephone Encounter (Signed)
Patient called in. She needed to r/s procedure that was on for this Friday with Dr. Abbey Chatters. She has moved to 5/30 at [place] will mail new instructions but also discussed new times with pt. ?

## 2022-01-17 NOTE — Telephone Encounter (Signed)
[name] [name] called back. She wants to move back to her original date 5/19 at 7:30am. She still has original instructions. Message sent to endo to make aware. ?

## 2022-01-19 ENCOUNTER — Encounter (HOSPITAL_COMMUNITY)
Admission: RE | Disposition: A | Discharge status: Home / Self Care | Payer: Self-pay | Source: Home / Self Care | Attending: Internal Medicine

## 2022-01-19 ENCOUNTER — Ambulatory Visit (HOSPITAL_COMMUNITY)
Admission: RE | Admit: 2022-01-19 | Discharge: 2022-01-19 | Disposition: A | Discharge status: Home / Self Care | Payer: PPO | Attending: Internal Medicine | Admitting: Internal Medicine

## 2022-01-19 ENCOUNTER — Other Ambulatory Visit: Payer: Self-pay

## 2022-01-19 ENCOUNTER — Ambulatory Visit (HOSPITAL_BASED_OUTPATIENT_CLINIC_OR_DEPARTMENT_OTHER): Payer: PPO | Admitting: Anesthesiology

## 2022-01-19 ENCOUNTER — Encounter (HOSPITAL_COMMUNITY): Payer: Self-pay

## 2022-01-19 ENCOUNTER — Ambulatory Visit (HOSPITAL_COMMUNITY): Payer: PPO | Admitting: Anesthesiology

## 2022-01-19 DIAGNOSIS — K648 Other hemorrhoids: Secondary | ICD-10-CM

## 2022-01-19 DIAGNOSIS — R7989 Other specified abnormal findings of blood chemistry: Secondary | ICD-10-CM

## 2022-01-19 DIAGNOSIS — Z1211 Encounter for screening for malignant neoplasm of colon: Secondary | ICD-10-CM | POA: Diagnosis not present

## 2022-01-19 DIAGNOSIS — R09A2 Foreign body sensation, throat: Secondary | ICD-10-CM

## 2022-01-19 DIAGNOSIS — K635 Polyp of colon: Secondary | ICD-10-CM

## 2022-01-19 DIAGNOSIS — R0989 Other specified symptoms and signs involving the circulatory and respiratory systems: Secondary | ICD-10-CM

## 2022-01-19 DIAGNOSIS — F419 Anxiety disorder, unspecified: Secondary | ICD-10-CM

## 2022-01-19 DIAGNOSIS — D649 Anemia, unspecified: Secondary | ICD-10-CM

## 2022-01-19 DIAGNOSIS — K3580 Unspecified acute appendicitis: Secondary | ICD-10-CM

## 2022-01-19 DIAGNOSIS — K219 Gastro-esophageal reflux disease without esophagitis: Secondary | ICD-10-CM

## 2022-01-19 DIAGNOSIS — R1013 Epigastric pain: Secondary | ICD-10-CM

## 2022-01-19 DIAGNOSIS — R079 Chest pain, unspecified: Secondary | ICD-10-CM

## 2022-01-19 DIAGNOSIS — Z853 Personal history of malignant neoplasm of breast: Secondary | ICD-10-CM

## 2022-01-19 DIAGNOSIS — I10 Essential (primary) hypertension: Secondary | ICD-10-CM | POA: Diagnosis not present

## 2022-01-19 DIAGNOSIS — K8000 Calculus of gallbladder with acute cholecystitis without obstruction: Secondary | ICD-10-CM

## 2022-01-19 DIAGNOSIS — K8020 Calculus of gallbladder without cholecystitis without obstruction: Secondary | ICD-10-CM

## 2022-01-19 HISTORY — PX: COLONOSCOPY WITH PROPOFOL: SHX5780

## 2022-01-19 HISTORY — PX: POLYPECTOMY: SHX5525

## 2022-01-19 SURGERY — COLONOSCOPY WITH PROPOFOL
Anesthesia: General

## 2022-01-19 MED ORDER — PROPOFOL 500 MG/50ML IV EMUL
INTRAVENOUS | Status: DC | PRN
  Administered 2022-01-19: 200 ug/kg/min via INTRAVENOUS

## 2022-01-19 MED ORDER — ONDANSETRON HCL 4 MG/2ML IJ SOLN
INTRAMUSCULAR | Status: AC
  Filled 2022-01-19: qty 2

## 2022-01-19 MED ORDER — ONDANSETRON HCL 4 MG/2ML IJ SOLN
4.0000 mg | Freq: Once | INTRAMUSCULAR | Status: AC
  Administered 2022-01-19: 4 mg via INTRAVENOUS

## 2022-01-19 MED ORDER — LIDOCAINE 2% (20 MG/ML) 5 ML SYRINGE
INTRAMUSCULAR | Status: DC | PRN
Start: 2022-01-19 — End: 2022-01-19
  Administered 2022-01-19: 50 mg via INTRAVENOUS

## 2022-01-19 MED ORDER — LACTATED RINGERS IV SOLN: INTRAVENOUS | Status: DC

## 2022-01-19 MED ORDER — PROPOFOL 10 MG/ML IV BOLUS
INTRAVENOUS | Status: DC | PRN
  Administered 2022-01-19: 60 mg via INTRAVENOUS
  Administered 2022-01-19: 40 mg via INTRAVENOUS

## 2022-01-19 NOTE — Op Note (Signed)
[facility] Patient Name: [name] [name] Procedure Date: 01/19/2022 7:47 AM MRN: [id number] Date of Birth: 17-Aug-1951 Attending MD: Elon Alas. Edgar Frisk CSN: [id number] Age: 71 Admit Type: Outpatient Procedure:                Colonoscopy Indications:              Screening for colorectal malignant neoplasm Providers:                Elon Alas. Abbey Chatters, DO, Janeece Riggers, RN, Raphael Gibney, Technician Referring MD:              Medicines:                See the Anesthesia note for documentation of the                            administered medications Complications:            No immediate complications. Estimated Blood Loss:     Estimated blood loss was minimal. Procedure:                Pre-Anesthesia Assessment:                           - The anesthesia plan was to use monitored                            anesthesia care (MAC).                           After obtaining informed consent, the colonoscope                            was passed under direct vision. Throughout the                            procedure, the patient's blood pressure, pulse, and                            oxygen saturations were monitored continuously. The                            [id number]-HQ190L ([id number]) scope was introduced through                            the anus and advanced to the the cecum, identified                            by appendiceal orifice and ileocecal valve. The                            colonoscopy was performed without difficulty. The                            patient tolerated the procedure well. The  quality                            of the bowel preparation was evaluated using the                            BBPS [facility] Bowel Preparation Scale) with scores                            of: Right Colon = 3, Transverse Colon = 3 and Left                            Colon = 3 (entire mucosa seen well with no residual                            staining, small  fragments of stool or opaque                            liquid). The total BBPS score equals 9. Scope In: 6:29:52 AM Scope Out: 8:09:45 AM Scope Withdrawal Time: 0 hours 9 minutes 0 seconds  Total Procedure Duration: 0 hours 12 minutes 47 seconds  Findings:      The perianal and digital rectal examinations were normal.      Non-bleeding internal hemorrhoids were found during endoscopy.      A 4 mm polyp was found in the ascending colon. The polyp was sessile.       The polyp was removed with a cold snare. Resection and retrieval were       complete.      The exam was otherwise without abnormality. Impression:               - Non-bleeding internal hemorrhoids.                           - One 4 mm polyp in the ascending colon, removed                            with a cold snare. Resected and retrieved.                           - The examination was otherwise normal. Moderate Sedation:      Per Anesthesia Care Recommendation:           - Patient has a contact number available for                            emergencies. The signs and symptoms of potential                            delayed complications were discussed with the                            patient. Return to normal activities tomorrow.  Written discharge instructions were provided to the                            patient.                           - Resume previous diet.                           - Continue present medications.                           - Await pathology results.                           - Repeat colonoscopy in 5 years for surveillance.                           - Return to GI clinic in 6 months. Procedure Code(s):        --- Professional ---                           [phone], Colonoscopy, flexible; with removal of                            tumor(s), polyp(s), or other lesion(s) by snare                            technique Diagnosis Code(s):        --- Professional ---                            Z12.11, Encounter for screening for malignant                            neoplasm of colon                           K63.5, Polyp of colon                           K64.8, Other hemorrhoids CPT copyright 2019 American Medical Association. All rights reserved. The codes documented in this report are preliminary and upon coder review may  be revised to meet current compliance requirements. Elon Alas. Abbey Chatters, DO [place] Abbey Chatters, DO 01/19/2022 8:13:12 AM This report has been signed electronically. Number of Addenda: 0

## 2022-01-19 NOTE — Anesthesia Preprocedure Evaluation (Signed)
Anesthesia Evaluation  Patient identified by MRN, date of birth, ID band Patient awake    Reviewed: Allergy & Precautions, NPO status , Patient's Chart, lab work & pertinent test results  Airway Mallampati: I  TM Distance: >3 FB Neck ROM: Full    Dental  (+) Dental Advisory Given, Teeth Intact   Pulmonary neg pulmonary ROS,    Pulmonary exam normal breath sounds clear to auscultation       Cardiovascular Exercise Tolerance: Good hypertension, Normal cardiovascular exam Rhythm:Regular Rate:Normal     Neuro/Psych PSYCHIATRIC DISORDERS Anxiety negative neurological ROS     GI/Hepatic Neg liver ROS, GERD  Medicated and Controlled,  Endo/Other  negative endocrine ROS  Renal/GU negative Renal ROS  negative genitourinary   Musculoskeletal negative musculoskeletal ROS (+)   Abdominal   Peds negative pediatric ROS (+)  Hematology negative hematology ROS (+) Blood dyscrasia, anemia ,   Anesthesia Other Findings   Reproductive/Obstetrics negative OB ROS                             Anesthesia Physical Anesthesia Plan  ASA: 2  Anesthesia Plan: General   Post-op Pain Management: Minimal or no pain anticipated   Induction: Intravenous  PONV Risk Score and Plan: Propofol infusion  Airway Management Planned: Nasal Cannula and Natural Airway  Additional Equipment:   Intra-op Plan:   Post-operative Plan:   Informed Consent: I have reviewed the patients History and Physical, chart, labs and discussed the procedure including the risks, benefits and alternatives for the proposed anesthesia with the patient or authorized representative who has indicated his/her understanding and acceptance.     Dental advisory given  Plan Discussed with: CRNA and Surgeon  Anesthesia Plan Comments: (Patient vomited greenish liquid  in pre op area, Zofran 4 mg was given.)      Anesthesia Quick  Evaluation

## 2022-01-19 NOTE — H&P (Signed)
Primary Care Physician:  Lemmie Evens, MD Primary Gastroenterologist:  Dr. Abbey Chatters  Pre-Procedure History & Physical: HPI:  [name] [name] is a 71 y.o. female is here for a colonoscopy for colon cancer screening purposes.  Patient denies any family history of colorectal cancer.  No melena or hematochezia.  No abdominal pain or unintentional weight loss.  No change in bowel habits.  Overall feels well from a GI standpoint.  Past Medical History:  Diagnosis Date   Breast cancer ([place])    Functional dyspepsia MAR 2014   APR 2014: PT DECLINED BRAVO. PPIs-PROTOIX/PRILOSEC-->DEXILANT-->NEXIUM(TOO EXPENSIVE)   GERD (gastroesophageal reflux disease)    High cholesterol    Hypertension     Past Surgical History:  Procedure Laterality Date   BALLOON DILATION N/A 04/28/2020   Procedure: BALLOON DILATION;  Surgeon: Eloise Harman, DO;  [facility];  Service: Endoscopy;  Laterality: N/A;   BIOPSY  04/28/2020   Procedure: BIOPSY;  Surgeon: Eloise Harman, DO;  [facility];  Service: Endoscopy;;   BREAST LUMPECTOMY  2006   Left  breast   BREAST LUMPECTOMY  1994   Right breast-benign   CHOLECYSTECTOMY N/A 05/10/2016   Procedure: LAPAROSCOPIC CHOLECYSTECTOMY WITH INTRAOPERATIVE CHOLANGIOGRAM;  Surgeon: Aviva Signs, MD;  Location: [facility];  Service: General;  Laterality: N/A;   COLONOSCOPY  2009   normal colon without evidence of polyps   ESOPHAGOGASTRODUODENOSCOPY  2009   normal stomach,esophagues without mass,reosion   ESOPHAGOGASTRODUODENOSCOPY  09/17/2011   SLF: Mild gastritis/NO SOURCE FOR IRON DEFICINECY ANEMIA IDENTIFIED   ESOPHAGOGASTRODUODENOSCOPY N/A 12/02/2012   SLF: 1. No source for dyspepsia identified 2. MILD Non-erosive gastritis   ESOPHAGOGASTRODUODENOSCOPY (EGD) WITH PROPOFOL N/A 04/28/2020   Procedure: ESOPHAGOGASTRODUODENOSCOPY (EGD) WITH PROPOFOL;  Surgeon: Eloise Harman, DO;  [facility];  Service: Endoscopy;  Laterality: N/A;  7:30am    GIVENS CAPSULE STUDY  09/26/2011   Procedure: GIVENS CAPSULE STUDY;  Surgeon: Dorothyann Peng, MD;  [facility];  Service: Endoscopy;  Laterality: N/A;   ILEOColonoscopy  09/17/2011   SLF: Nl colonoscopy WMO/INTERNA; HEMORRHOIDS/ NO SOURCE FOR IRON DEFICIENCY ANEMA IDENTIFIED   LAPAROSCOPIC APPENDECTOMY N/A 06/25/2018   Procedure: APPENDECTOMY LAPAROSCOPIC;  Surgeon: Aviva Signs, MD;  Location: [facility];  Service: General;  Laterality: N/A;    Prior to Admission medications   Medication Sig Start Date End Date Taking? Authorizing Provider  acetaminophen (TYLENOL) 325 MG tablet Take 325-650 mg by mouth daily as needed (pain).   Yes [provider]  atorvastatin (LIPITOR) 20 MG tablet Take 20 mg by mouth at bedtime.  09/09/17  Yes [provider]  Calcium Carb-Cholecalciferol (CALCIUM + D3 PO) Take 1 tablet by mouth in the morning.   Yes [provider]  calcium elemental as carbonate (BARIATRIC TUMS ULTRA) 400 MG chewable tablet Chew 1 tablet by mouth 3 (three) times daily as needed for indigestion or heartburn.   Yes [provider]  clobetasol cream (TEMOVATE) 1.74 % Apply 1 application topically daily as needed (irritation).   Yes [provider]  enalapril (VASOTEC) 10 MG tablet Take 10 mg by mouth in the morning.   Yes [provider]  hydrocortisone cream 1 % Apply 1 application. topically 2 (two) times daily as needed (skin irritation/itching).   Yes [provider]  LORazepam (ATIVAN) 0.5 MG tablet Take 0.5 mg by mouth at bedtime.   Yes [provider]  oxymetazoline (AFRIN) 0.05 % nasal spray Place 1 spray  into both nostrils at bedtime as needed for congestion.   Yes [provider]  pantoprazole (PROTONIX) 40 MG tablet TAKE 1 TABLET(40 MG) BY MOUTH TWICE DAILY BEFORE A MEAL 10/17/21  Yes Annitta Needs, NP  polyethylene glycol-electrolytes (TRILYTE) 420 g solution Take 4,000 mLs by mouth as directed.  12/06/21  Yes Morton Simson K, DO  sucralfate (CARAFATE) 1 g tablet Take 1 tablet (1 g total) by mouth 4 (four) times daily -  with meals and at bedtime. Patient taking differently: Take 1 g by mouth 3 (three) times daily as needed (stomach issues). 07/05/21 01/17/25 Yes Martisha Toulouse, Elon Alas, DO  valACYclovir (VALTREX) 500 MG tablet Take 500 mg by mouth as needed (for outbreaks).    Yes [provider]  vitamin B-12 (CYANOCOBALAMIN) 1000 MCG tablet Take 1,000 mcg by mouth 3 (three) times a week. In the morning   Yes [provider]  Vitamins A & D (VITAMIN A & D) ointment Apply 1 application. topically as needed (irritation).   Yes [provider]  alum & mag hydroxide-simeth (MAALOX/MYLANTA) 200-200-20 MG/5ML suspension Take 15-30 mLs by mouth daily as needed for indigestion or heartburn.    [provider]    Allergies as of 12/06/2021 - Review Complete 12/05/2021  Allergen Reaction Noted   Sulfa antibiotics Itching and Rash 05/09/2016   Tape Rash 05/10/2016    Family History  Problem Relation Age of Onset   Colon cancer Maternal Aunt    Cancer - Colon Cousin    Anesthesia problems Neg Hx    Hypotension Neg Hx    Malignant hyperthermia Neg Hx    Pseudochol deficiency Neg Hx    Colon polyps Neg Hx     Social History   Socioeconomic History   Marital status: Married    Spouse name: Not on file   Number of children: Not on file   Years of education: Not on file   Highest education level: Not on file  Occupational History   Not on file  Tobacco Use   Smoking status: Never   Smokeless tobacco: Never  Vaping Use   Vaping Use: Never used  Substance and Sexual Activity   Alcohol use: No   Drug use: No   Sexual activity: Not on file  Other Topics Concern   Not on file  Social History Narrative   Not on file   Social Determinants of Health   Financial Resource Strain: Not on file  Food Insecurity: Not on file  Transportation Needs: Not on  file  Physical Activity: Not on file  Stress: Not on file  Social Connections: Not on file  Intimate Partner Violence: Not on file    Review of Systems: See HPI, otherwise negative ROS  Physical Exam: Vital signs in last 24 hours: Temp:  [98.1 F (36.7 C)] 98.1 F (36.7 C) (05/19 0640) Pulse Rate:  [87] 87 (05/19 0640) Resp:  [12] 12 (05/19 0640) BP: (141)/(72) 141/72 (05/19 0640) SpO2:  [100 %] 100 % (05/19 0640) Weight:  [56.7 kg] 56.7 kg (05/19 0640)   General:   Alert,  Well-developed, well-nourished, pleasant and cooperative in NAD Head:  Normocephalic and atraumatic. Eyes:  Sclera clear, no icterus.   Conjunctiva pink. Ears:  Normal auditory acuity. Nose:  No deformity, discharge,  or lesions. Mouth:  No deformity or lesions, dentition normal. Neck:  Supple; no masses or thyromegaly. Lungs:  Clear throughout to auscultation.   No wheezes, crackles, or rhonchi. No acute distress.  Heart:  Regular rate and rhythm; no murmurs, clicks, rubs,  or gallops. Abdomen:  Soft, nontender and nondistended. No masses, hepatosplenomegaly or hernias noted. Normal bowel sounds, without guarding, and without rebound.   Msk:  Symmetrical without gross deformities. Normal posture. Extremities:  Without clubbing or edema. Neurologic:  Alert and  oriented x4;  grossly normal neurologically. Skin:  Intact without significant lesions or rashes. Cervical Nodes:  No significant cervical adenopathy. Psych:  Alert and cooperative. Normal mood and affect.  Impression/Plan: [name] [name] is here for a colonoscopy to be performed for colon cancer screening purposes.  The risks of the procedure including infection, bleed, or perforation as well as benefits, limitations, alternatives and imponderables have been reviewed with the patient. Questions have been answered. All parties agreeable.

## 2022-01-19 NOTE — Anesthesia Postprocedure Evaluation (Signed)
Anesthesia Post Note  Patient: [name] [name]  Procedure(s) Performed: COLONOSCOPY WITH PROPOFOL POLYPECTOMY  Patient location during evaluation: Endoscopy Anesthesia Type: General Level of consciousness: awake and alert and oriented Pain management: pain level controlled Vital Signs Assessment: post-procedure vital signs reviewed and stable Respiratory status: spontaneous breathing, nonlabored ventilation and respiratory function stable Cardiovascular status: blood pressure returned to baseline and stable Postop Assessment: no apparent nausea or vomiting Anesthetic complications: no   No notable events documented.   Last Vitals:  Vitals:   01/19/22 0813 01/19/22 0818  BP: (!) 92/52 (!) 95/51  Pulse:    Resp: 14   Temp: (!) 36.4 C   SpO2: 97%     Last Pain:  Vitals:   01/19/22 0818  TempSrc:   PainSc: 0-No pain                 Geneive Sandstrom C Neasia Fleeman

## 2022-01-19 NOTE — Discharge Instructions (Addendum)
  Colonoscopy Discharge Instructions  Read the instructions outlined below and refer to this sheet in the next few weeks. These discharge instructions provide you with general information on caring for yourself after you leave the hospital. Your doctor may also give you specific instructions. While your treatment has been planned according to the most current medical practices available, unavoidable complications occasionally occur.   ACTIVITY You may resume your regular activity, but move at a slower pace for the next 24 hours.  Take frequent rest periods for the next 24 hours.  Walking will help get rid of the air and reduce the bloated feeling in your belly (abdomen).  No driving for 24 hours (because of the medicine (anesthesia) used during the test).   Do not sign any important legal documents or operate any machinery for 24 hours (because of the anesthesia used during the test).  NUTRITION Drink plenty of fluids.  You may resume your normal diet as instructed by your doctor.  Begin with a light meal and progress to your normal diet. Heavy or fried foods are harder to digest and may make you feel sick to your stomach (nauseated).  Avoid alcoholic beverages for 24 hours or as instructed.  MEDICATIONS You may resume your normal medications unless your doctor tells you otherwise.  WHAT YOU CAN EXPECT TODAY Some feelings of bloating in the abdomen.  Passage of more gas than usual.  Spotting of blood in your stool or on the toilet paper.  IF YOU HAD POLYPS REMOVED DURING THE COLONOSCOPY: No aspirin products for 7 days or as instructed.  No alcohol for 7 days or as instructed.  Eat a soft diet for the next 24 hours.  FINDING OUT THE RESULTS OF YOUR TEST Not all test results are available during your visit. If your test results are not back during the visit, make an appointment with your caregiver to find out the results. Do not assume everything is normal if you have not heard from your  caregiver or the medical facility. It is important for you to follow up on all of your test results.  SEEK IMMEDIATE MEDICAL ATTENTION IF: You have more than a spotting of blood in your stool.  Your belly is swollen (abdominal distention).  You are nauseated or vomiting.  You have a temperature over 101.  You have abdominal pain or discomfort that is severe or gets worse throughout the day.   Your colonoscopy revealed 1 polyp(s) which I removed successfully. Await pathology results, my office will contact you. I recommend repeating colonoscopy in 5 years for surveillance purposes.   Follow up with GI in 6 months.   I hope you have a great rest of your week!  Charles K. Carver, D.O. Gastroenterology and Hepatology [facility]  

## 2022-01-19 NOTE — Transfer of Care (Signed)
Immediate Anesthesia Transfer of Care Note  Patient: [name] [name]  Procedure(s) Performed: COLONOSCOPY WITH PROPOFOL POLYPECTOMY  Patient Location: Endoscopy Unit  Anesthesia Type:MAC  Level of Consciousness: sedated, patient cooperative and responds to stimulation  Airway & Oxygen Therapy: Patient Spontanous Breathing  Post-op Assessment: Report given to RN, Post -op Vital signs reviewed and stable and Patient moving all extremities  Post vital signs: Reviewed and stable  Last Vitals:  Vitals Value Taken Time  BP    Temp    Pulse    Resp    SpO2      Last Pain:  Vitals:   01/19/22 0749  TempSrc:   PainSc: 0-No pain      Patients Stated Pain Goal: 8 (63/01/60 1093)  Complications: No notable events documented.

## 2022-01-23 ENCOUNTER — Telehealth: Payer: Self-pay

## 2022-01-23 LAB — SURGICAL PATHOLOGY

## 2022-01-23 NOTE — Telephone Encounter (Signed)
Pt LMOVM for the nurse to call her. Loma Sousa seen her last in April

## 2022-01-24 NOTE — Telephone Encounter (Signed)
Spoke to pt, she was concerned, because she had a MyChart message , but she could not get into her chart. She wanted to know if it was the results from the colonoscopy. Informed her that someone would call her as soon as they came available

## 2022-01-25 ENCOUNTER — Encounter (HOSPITAL_COMMUNITY): Payer: Self-pay | Admitting: Internal Medicine

## 2022-02-06 NOTE — Telephone Encounter (Signed)
Pt is calling back to see if there may be a problem with her because her stools are still a light color. I advised the pt that no one persons BM is the same color. What she eat or take could play a part in this. She just wants to make sure that there wasn't something missed on her colonoscopy that we didn't share with her. I assured her that the colonoscopy report didn't mention any issues. She states she is having cataract surgery soon and wants to make sure that this has been addressed.

## 2022-03-10 ENCOUNTER — Ambulatory Visit
Admission: EM | Admit: 2022-03-10 | Discharge: 2022-03-10 | Disposition: A | Discharge status: Home / Self Care | Payer: PPO

## 2022-03-10 DIAGNOSIS — R2231 Localized swelling, mass and lump, right upper limb: Secondary | ICD-10-CM | POA: Diagnosis not present

## 2022-03-10 NOTE — Discharge Instructions (Addendum)
Patient decline AVS.

## 2022-03-10 NOTE — ED Provider Notes (Addendum)
[facility]    CSN: [id number] Arrival date & time: 03/10/22  1315      History   Chief Complaint No chief complaint on file.   HPI [name] [name] is a 71 y.o. female.   HPI  Patient presents with induration to the right forearm that started this morning.  She states the area was swollen and is tender to touch.  She states that she did use ice and noticed that the area decreased in size.  She denies chest pain, shortness of breath, difficulty breathing, being on blood thinners.  She noticed the area when she was cleaning her house this morning and is unsure if she may have hit her arm on something.  She states that she was concerned about a blood clot.  Past Medical History:  Diagnosis Date   Breast cancer ([place])    Functional dyspepsia MAR 2014   APR 2014: PT DECLINED BRAVO. PPIs-PROTOIX/PRILOSEC-->DEXILANT-->NEXIUM(TOO EXPENSIVE)   GERD (gastroesophageal reflux disease)    High cholesterol    Hypertension     Patient Active Problem List   Diagnosis Date Noted   Globus sensation 12/01/2019   Acute appendicitis    Abnormal LFTs    Acute calculous cholecystitis 05/09/2016   Anxiety 05/09/2016   Hypertension 05/09/2016   Cholelithiasis 04/24/2016   Dyspepsia 11/12/2012   Chest pain 11/12/2012   Anemia 08/20/2012   GERD 01/27/2009   HEMORRHOIDS 01/26/2009   Hx Breast cancer, IDC, Left, Stage II, receptor+, Her2 - 01/26/2005    Past Surgical History:  Procedure Laterality Date   BALLOON DILATION N/A 04/28/2020   Procedure: BALLOON DILATION;  Surgeon: Eloise Harman, DO;  [facility];  Service: Endoscopy;  Laterality: N/A;   BIOPSY  04/28/2020   Procedure: BIOPSY;  Surgeon: Eloise Harman, DO;  [facility];  Service: Endoscopy;;   BREAST LUMPECTOMY  2006   Left  breast   BREAST LUMPECTOMY  1994   Right breast-benign   CHOLECYSTECTOMY N/A 05/10/2016   Procedure: LAPAROSCOPIC CHOLECYSTECTOMY WITH INTRAOPERATIVE CHOLANGIOGRAM;   Surgeon: Aviva Signs, MD;  Location: [facility];  Service: General;  Laterality: N/A;   COLONOSCOPY  2009   normal colon without evidence of polyps   COLONOSCOPY WITH PROPOFOL N/A 01/19/2022   Procedure: COLONOSCOPY WITH PROPOFOL;  Surgeon: Eloise Harman, DO;  [facility];  Service: Endoscopy;  Laterality: N/A;  7:30am   ESOPHAGOGASTRODUODENOSCOPY  2009   normal stomach,esophagues without mass,reosion   ESOPHAGOGASTRODUODENOSCOPY  09/17/2011   SLF: Mild gastritis/NO SOURCE FOR IRON DEFICINECY ANEMIA IDENTIFIED   ESOPHAGOGASTRODUODENOSCOPY N/A 12/02/2012   SLF: 1. No source for dyspepsia identified 2. MILD Non-erosive gastritis   ESOPHAGOGASTRODUODENOSCOPY (EGD) WITH PROPOFOL N/A 04/28/2020   Procedure: ESOPHAGOGASTRODUODENOSCOPY (EGD) WITH PROPOFOL;  Surgeon: Eloise Harman, DO;  [facility];  Service: Endoscopy;  Laterality: N/A;  7:30am   GIVENS CAPSULE STUDY  09/26/2011   Procedure: GIVENS CAPSULE STUDY;  Surgeon: Dorothyann Peng, MD;  [facility];  Service: Endoscopy;  Laterality: N/A;   ILEOColonoscopy  09/17/2011   SLF: Nl colonoscopy WMO/INTERNA; HEMORRHOIDS/ NO SOURCE FOR IRON DEFICIENCY ANEMA IDENTIFIED   LAPAROSCOPIC APPENDECTOMY N/A 06/25/2018   Procedure: APPENDECTOMY LAPAROSCOPIC;  Surgeon: Aviva Signs, MD;  Location: [facility];  Service: General;  Laterality: N/A;   POLYPECTOMY  01/19/2022   Procedure: POLYPECTOMY;  Surgeon: Eloise Harman, DO;  [facility];  Service: Endoscopy;;    OB History  Gravida  1   Para  1   Term  1   Preterm      AB      Living         SAB      IAB      Ectopic      Multiple      Live Births               Home Medications    Prior to Admission medications   Medication Sig Start Date End Date Taking? Authorizing Provider  acetaminophen (TYLENOL) 325 MG tablet Take 325-650 mg by mouth daily as needed (pain).    [provider]  alum & mag hydroxide-simeth  (MAALOX/MYLANTA) 200-200-20 MG/5ML suspension Take 15-30 mLs by mouth daily as needed for indigestion or heartburn.    [provider]  atorvastatin (LIPITOR) 20 MG tablet Take 20 mg by mouth at bedtime.  09/09/17   [provider]  Calcium Carb-Cholecalciferol (CALCIUM + D3 PO) Take 1 tablet by mouth in the morning.    [provider]  calcium elemental as carbonate (BARIATRIC TUMS ULTRA) 400 MG chewable tablet Chew 1 tablet by mouth 3 (three) times daily as needed for indigestion or heartburn.    [provider]  clobetasol cream (TEMOVATE) 9.50 % Apply 1 application topically daily as needed (irritation).    [provider]  enalapril (VASOTEC) 10 MG tablet Take 10 mg by mouth in the morning.    [provider]  hydrocortisone cream 1 % Apply 1 application. topically 2 (two) times daily as needed (skin irritation/itching).    [provider]  LORazepam (ATIVAN) 0.5 MG tablet Take 0.5 mg by mouth at bedtime.    [provider]  oxymetazoline (AFRIN) 0.05 % nasal spray Place 1 spray into both nostrils at bedtime as needed for congestion.    [provider]  pantoprazole (PROTONIX) 40 MG tablet TAKE 1 TABLET(40 MG) BY MOUTH TWICE DAILY BEFORE A MEAL 10/17/21   Annitta Needs, NP  sucralfate (CARAFATE) 1 g tablet Take 1 tablet (1 g total) by mouth 4 (four) times daily -  with meals and at bedtime. Patient taking differently: Take 1 g by mouth 3 (three) times daily as needed (stomach issues). 07/05/21 01/17/25  Eloise Harman, DO  valACYclovir (VALTREX) 500 MG tablet Take 500 mg by mouth as needed (for outbreaks).     [provider]  vitamin B-12 (CYANOCOBALAMIN) 1000 MCG tablet Take 1,000 mcg by mouth 3 (three) times a week. In the morning    [provider]  Vitamins A & D (VITAMIN A & D) ointment Apply 1 application. topically as needed (irritation).    [provider]    Family History Family  History  Problem Relation Age of Onset   Colon cancer Maternal Aunt    Cancer - Colon Cousin    Anesthesia problems Neg Hx    Hypotension Neg Hx    Malignant hyperthermia Neg Hx    Pseudochol deficiency Neg Hx    Colon polyps Neg Hx     Social History Social History   Tobacco Use   Smoking status: Never   Smokeless tobacco: Never  Vaping Use   Vaping Use: Never used  Substance Use Topics   Alcohol use: No   Drug use: No     Allergies   Sulfa antibiotics and Tape   Review of Systems Review of Systems Per HPI  Physical Exam Triage  Vital Signs ED Triage Vitals  Enc Vitals Group     BP 03/10/22 1358 (!) 152/69     Pulse Rate 03/10/22 1358 97     Resp 03/10/22 1358 16     Temp 03/10/22 1358 98 F (36.7 C)     Temp Source 03/10/22 1358 Oral     SpO2 03/10/22 1358 97 %     Weight --      Height --      Head Circumference --      Peak Flow --      Pain Score 03/10/22 1400 3     Pain Loc --      Pain Edu? --      Excl. in [place]? --    No data found.  Updated Vital Signs BP (!) 152/69 (BP Location: Right Arm)   Pulse 97   Temp 98 F (36.7 C) (Oral)   Resp 16   SpO2 97%   Visual Acuity Right Eye Distance:   Left Eye Distance:   Bilateral Distance:    Right Eye Near:   Left Eye Near:    Bilateral Near:     Physical Exam Vitals and nursing note reviewed.  Constitutional:      General: She is not in acute distress.    Appearance: She is well-developed.  Cardiovascular:     Rate and Rhythm: Normal rate and regular rhythm.     Pulses: Normal pulses.     Heart sounds: Normal heart sounds.  Pulmonary:     Effort: Pulmonary effort is normal.     Breath sounds: Normal breath sounds.  Abdominal:     General: Bowel sounds are normal. There is no distension.     Palpations: Abdomen is soft.     Tenderness: There is no abdominal tenderness. There is no guarding or rebound.  Genitourinary:    Vagina: Normal. No vaginal discharge.  Musculoskeletal:      Right forearm: Swelling and tenderness present.     Cervical back: Normal range of motion.  Skin:    General: Skin is warm and dry.     Capillary Refill: Capillary refill takes less than 2 seconds.     Findings: Bruising present. No erythema, rash or wound.     Comments: Induration to the medial aspect of the forearm.  Area measures approximately 1 cm in diameter.  Area is tender to palpation.  No redness, swelling present.  Mild bruising is noted.  Neurological:     General: No focal deficit present.     Mental Status: She is alert and oriented to person, place, and time.     Cranial Nerves: No cranial nerve deficit.  Psychiatric:        Mood and Affect: Mood normal.        Behavior: Behavior normal.      UC Treatments / Results  Labs (all labs ordered are listed, but only abnormal results are displayed) Labs Reviewed - No data to display  EKG   Radiology No results found.  Procedures Procedures (including critical care time)  Medications Ordered in UC Medications - No data to display  Initial Impression / Assessment and Plan / UC Course  I have reviewed the triage vital signs and the nursing notes.  Pertinent labs & imaging results that were available during my care of the patient were reviewed by me and considered in my medical decision making (see chart for details).  She presents with an induration to the right  forearm that is been present since this morning.  She presents today for concern for a blood clot.  On exam, patient has a 1 cm induration to the medial aspect of the right forearm.  Area is tender to palpation.  Area has also decreased in size after the patient applied ice.  Discussion with patient that symptoms do not correlate with a blood clot.  Her vital signs are stable, she is well-appearing, and is in no acute distress.  There are no systemic symptoms to include chest pain, shortness of breath, or difficulty breathing.  Supportive care recommendations were  provided to the patient to include continued use of ice to the affected area.  Patient was advised to continue to monitor the area for any worsening of symptoms to include redness, pain, or increasing size.  Patient was advised to follow-up with her doctor if her symptoms do not improve, indications to go to the emergency department were provided to the patient.  Follow-up as needed. Final Clinical Impressions(s) / UC Diagnoses   Final diagnoses:  Localized swelling of right forearm     Discharge Instructions      Patient decline AVS.     ED Prescriptions   None    PDMP not reviewed this encounter.   Tish Men, NP 03/10/22 1421    Justinn Welter-Warren, Alda Lea, NP 03/10/22 1422

## 2022-03-10 NOTE — ED Triage Notes (Signed)
Pt reports knot in the right forearm sine this morning. Reports soreness around the area. Pt has a concern for blood clot.

## 2022-04-11 ENCOUNTER — Telehealth: Payer: Self-pay | Admitting: Internal Medicine

## 2022-04-11 ENCOUNTER — Telehealth: Payer: Self-pay

## 2022-04-11 ENCOUNTER — Other Ambulatory Visit: Payer: Self-pay | Admitting: Gastroenterology

## 2022-04-11 DIAGNOSIS — K219 Gastro-esophageal reflux disease without esophagitis: Secondary | ICD-10-CM

## 2022-04-11 MED ORDER — PANTOPRAZOLE SODIUM 40 MG PO TBEC: DELAYED_RELEASE_TABLET | ORAL | 5 refills | Status: DC

## 2022-04-11 NOTE — Telephone Encounter (Signed)
Can we see if we get this patient in sooner than November with either myself or an app?  Light-colored stools.  Thank you

## 2022-04-11 NOTE — Telephone Encounter (Signed)
Refill request for Pantoprazole 40 mg tablets qty:60 was received from [facility] Dr. Renee Rival last ov was on 12/05/2021.

## 2022-04-13 NOTE — Telephone Encounter (Signed)
OV made °

## 2022-05-10 ENCOUNTER — Telehealth: Payer: Self-pay | Admitting: *Deleted

## 2022-05-10 ENCOUNTER — Encounter: Payer: Self-pay | Admitting: Internal Medicine

## 2022-05-10 ENCOUNTER — Ambulatory Visit (INDEPENDENT_AMBULATORY_CARE_PROVIDER_SITE_OTHER): Payer: PPO | Admitting: Internal Medicine

## 2022-05-10 VITALS — BP 131/76 | HR 116 | Temp 98.6°F | Ht 65.0 in | Wt 129.6 lb

## 2022-05-10 DIAGNOSIS — R195 Other fecal abnormalities: Secondary | ICD-10-CM | POA: Diagnosis not present

## 2022-05-10 DIAGNOSIS — R1013 Epigastric pain: Secondary | ICD-10-CM

## 2022-05-10 DIAGNOSIS — G8929 Other chronic pain: Secondary | ICD-10-CM | POA: Diagnosis not present

## 2022-05-10 DIAGNOSIS — K219 Gastro-esophageal reflux disease without esophagitis: Secondary | ICD-10-CM | POA: Diagnosis not present

## 2022-05-10 NOTE — Telephone Encounter (Signed)
Patient informed appt for the [place] in on 05/14/22 at 1:30 pm, needs to arrive at 1:00 pm, nothing to eat or drink 6-8 hours prior to procedure. Pt verbalized understanding.

## 2022-05-10 NOTE — Progress Notes (Signed)
Referring Provider: Lemmie Evens, MD Primary Care Physician:  Lemmie Evens, MD Primary GI:  Dr. Abbey Chatters  Chief Complaint  Patient presents with   Follow-up    States that her stools are not the right color. Still having issues with her stomach.     HPI:   [name] [name] is a 71 y.o. female who presents to the clinic today for follow-up visit.  History of chronic GERD currently on pantoprazole twice daily.  States this is relatively well controlled.  Does have some epigastric discomfort at times.  EGD 04/28/2020 with benign esophageal stenosis status post dilation, gastritis, negative for H. pylori.  Normal duodenum.  Currently taking Carafate on top of this as needed which she states is helping.  Does note some epigastric discomfort at times, mild to moderate, intermittent in nature.  Has tried Dexilant in the past did not seem very helpful.  Does take Mylanta as well on occasion.  States as long she abides by her diet her symptoms are relatively well controlled.  Previous evaluation by ENT.  Main concern for me today is ongoing light-colored stools.  She states these are "not normal."  This has been going on since stopping oral iron last year.  States these are light yellow.  Does note that she had blood work done in April by her PCP which she states that her bilirubin was abnormal.  Notes her father died from liver cancer.  Past Medical History:  Diagnosis Date   Breast cancer ([place])    Functional dyspepsia MAR 2014   APR 2014: PT DECLINED BRAVO. PPIs-PROTOIX/PRILOSEC-->DEXILANT-->NEXIUM(TOO EXPENSIVE)   GERD (gastroesophageal reflux disease)    High cholesterol    Hypertension     Past Surgical History:  Procedure Laterality Date   BALLOON DILATION N/A 04/28/2020   Procedure: BALLOON DILATION;  Surgeon: Eloise Harman, DO;  [facility];  Service: Endoscopy;  Laterality: N/A;   BIOPSY  04/28/2020   Procedure: BIOPSY;  Surgeon: Eloise Harman, DO;   [facility];  Service: Endoscopy;;   BREAST LUMPECTOMY  2006   Left  breast   BREAST LUMPECTOMY  1994   Right breast-benign   CHOLECYSTECTOMY N/A 05/10/2016   Procedure: LAPAROSCOPIC CHOLECYSTECTOMY WITH INTRAOPERATIVE CHOLANGIOGRAM;  Surgeon: Aviva Signs, MD;  Location: [facility];  Service: General;  Laterality: N/A;   COLONOSCOPY  2009   normal colon without evidence of polyps   COLONOSCOPY WITH PROPOFOL N/A 01/19/2022   Procedure: COLONOSCOPY WITH PROPOFOL;  Surgeon: Eloise Harman, DO;  [facility];  Service: Endoscopy;  Laterality: N/A;  7:30am   ESOPHAGOGASTRODUODENOSCOPY  2009   normal stomach,esophagues without mass,reosion   ESOPHAGOGASTRODUODENOSCOPY  09/17/2011   SLF: Mild gastritis/NO SOURCE FOR IRON DEFICINECY ANEMIA IDENTIFIED   ESOPHAGOGASTRODUODENOSCOPY N/A 12/02/2012   SLF: 1. No source for dyspepsia identified 2. MILD Non-erosive gastritis   ESOPHAGOGASTRODUODENOSCOPY (EGD) WITH PROPOFOL N/A 04/28/2020   Procedure: ESOPHAGOGASTRODUODENOSCOPY (EGD) WITH PROPOFOL;  Surgeon: Eloise Harman, DO;  [facility];  Service: Endoscopy;  Laterality: N/A;  7:30am   GIVENS CAPSULE STUDY  09/26/2011   Procedure: GIVENS CAPSULE STUDY;  Surgeon: Dorothyann Peng, MD;  [facility];  Service: Endoscopy;  Laterality: N/A;   ILEOColonoscopy  09/17/2011   SLF: Nl colonoscopy WMO/INTERNA; HEMORRHOIDS/ NO SOURCE FOR IRON DEFICIENCY ANEMA IDENTIFIED   LAPAROSCOPIC APPENDECTOMY N/A 06/25/2018   Procedure: APPENDECTOMY LAPAROSCOPIC;  Surgeon: Aviva Signs, MD;  [facility];  Service: General;  Laterality: N/A;   POLYPECTOMY  01/19/2022   Procedure: POLYPECTOMY;  Surgeon: Eloise Harman, DO;  [facility];  Service: Endoscopy;;    Current Outpatient Medications  Medication Sig Dispense Refill   acetaminophen (TYLENOL) 325 MG tablet Take 325-650 mg by mouth daily as needed (pain).     alum & mag hydroxide-simeth (MAALOX/MYLANTA)  200-200-20 MG/5ML suspension Take 15-30 mLs by mouth daily as needed for indigestion or heartburn.     atorvastatin (LIPITOR) 20 MG tablet Take 20 mg by mouth at bedtime.   0   Calcium Carb-Cholecalciferol (CALCIUM + D3 PO) Take 1 tablet by mouth in the morning.     calcium elemental as carbonate (BARIATRIC TUMS ULTRA) 400 MG chewable tablet Chew 1 tablet by mouth 3 (three) times daily as needed for indigestion or heartburn.     clobetasol cream (TEMOVATE) 9.62 % Apply 1 application topically daily as needed (irritation).     enalapril (VASOTEC) 10 MG tablet Take 10 mg by mouth in the morning.     hydrocortisone cream 1 % Apply 1 application. topically 2 (two) times daily as needed (skin irritation/itching).     LORazepam (ATIVAN) 0.5 MG tablet Take 0.5 mg by mouth at bedtime.     oxymetazoline (AFRIN) 0.05 % nasal spray Place 1 spray into both nostrils at bedtime as needed for congestion.     pantoprazole (PROTONIX) 40 MG tablet TAKE 1 TABLET(40 MG) BY MOUTH TWICE DAILY BEFORE A MEAL 60 tablet 5   sucralfate (CARAFATE) 1 g tablet Take 1 tablet (1 g total) by mouth 4 (four) times daily -  with meals and at bedtime. (Patient taking differently: Take 1 g by mouth 3 (three) times daily as needed (stomach issues).) 120 tablet 5   valACYclovir (VALTREX) 500 MG tablet Take 500 mg by mouth as needed (for outbreaks).   12   vitamin B-12 (CYANOCOBALAMIN) 1000 MCG tablet Take 1,000 mcg by mouth 3 (three) times a week. In the morning     Vitamins A & D (VITAMIN A & D) ointment Apply 1 application. topically as needed (irritation).     No current facility-administered medications for this visit.    Allergies as of 05/10/2022 - Review Complete 05/10/2022  Allergen Reaction Noted   Sulfa antibiotics Itching and Rash 05/09/2016   Tape Rash 05/10/2016    Family History  Problem Relation Age of Onset   Colon cancer Maternal Aunt    Cancer - Colon Cousin    Anesthesia problems Neg Hx    Hypotension Neg Hx     Malignant hyperthermia Neg Hx    Pseudochol deficiency Neg Hx    Colon polyps Neg Hx     Social History   Socioeconomic History   Marital status: Married    Spouse name: Not on file   Number of children: Not on file   Years of education: Not on file   Highest education level: Not on file  Occupational History   Not on file  Tobacco Use   Smoking status: Never   Smokeless tobacco: Never  Vaping Use   Vaping Use: Never used  Substance and Sexual Activity   Alcohol use: No   Drug use: No   Sexual activity: Not Currently  Other Topics Concern   Not on file  Social History Narrative   Not on file   Social Determinants of Health   Financial Resource Strain: Not on file  Food Insecurity: Not on file  Transportation Needs: Not  on file  Physical Activity: Not on file  Stress: Not on file  Social Connections: Not on file    Subjective: Review of Systems  Constitutional:  Negative for chills and fever.  HENT:  Negative for congestion and hearing loss.   Eyes:  Negative for blurred vision and double vision.  Respiratory:  Negative for cough and shortness of breath.   Cardiovascular:  Negative for chest pain and palpitations.  Gastrointestinal:  Negative for abdominal pain, blood in stool, constipation, diarrhea, heartburn, melena and vomiting.  Genitourinary:  Negative for dysuria and urgency.  Musculoskeletal:  Negative for joint pain and myalgias.  Skin:  Negative for itching and rash.  Neurological:  Negative for dizziness and headaches.  Psychiatric/Behavioral:  Negative for depression. The patient is not nervous/anxious.      Objective: BP 131/76 (BP Location: Right Arm, Patient Position: Sitting, Cuff Size: Normal)   Pulse (!) 116   Temp 98.6 F (37 C) (Temporal)   Ht '5\' 5"'$  (1.651 m)   Wt 129 lb 9.6 oz (58.8 kg)   SpO2 99%   BMI 21.57 kg/m  Physical Exam Constitutional:      Appearance: Normal appearance.  HENT:     Head: Normocephalic and  atraumatic.  Eyes:     Extraocular Movements: Extraocular movements intact.     Conjunctiva/sclera: Conjunctivae normal.  Cardiovascular:     Rate and Rhythm: Normal rate and regular rhythm.  Pulmonary:     Effort: Pulmonary effort is normal.     Breath sounds: Normal breath sounds.  Abdominal:     General: Bowel sounds are normal.     Palpations: Abdomen is soft.  Musculoskeletal:        General: No swelling. Normal range of motion.     Cervical back: Normal range of motion and neck supple.  Skin:    General: Skin is warm and dry.     Coloration: Skin is not jaundiced.  Neurological:     General: No focal deficit present.     Mental Status: She is alert and oriented to person, place, and time.  Psychiatric:        Mood and Affect: Mood normal.        Behavior: Behavior normal.      Assessment: *Abnormal stool color  *Chronic GERD-relatively well controlled on Pantoprazole *Epigastric discomfort   Plan: Patient has a concerned about her abnormally light-colored stools today.  We will check LFTs and GGT.  Right upper quadrant ultrasound ordered to evaluate liver and biliary system given her epigastric discomfort as well.  Chronic GERD relatively well controlled on pantoprazole twice daily.  Told her she can go down to once daily and see how she does.  Continue Carafate on top of this as needed.  Follow-up in 6 months.  05/10/2022 10:45 AM   Disclaimer: This note was dictated with voice recognition software. Similar sounding words can inadvertently be transcribed and may not be corrected upon review.

## 2022-05-10 NOTE — Patient Instructions (Signed)
I am going to check blood work to check your liver today.  I am also going to order an ultrasound of your liver as well.  We will call with these results.    Continue on pantoprazole for your chronic reflux.  You can try decreasing this to once a day and see how you do.  Continue Carafate as needed.  Follow-up with GI in 6 months.  It was nice seeing you again today.  Dr. Abbey Chatters

## 2022-05-11 LAB — HEPATIC FUNCTION PANEL
AG Ratio: 1.5 (calc) (ref 1.0–2.5)
ALT: 11 U/L (ref 6–29)
AST: 18 U/L (ref 10–35)
Albumin: 4.4 g/dL (ref 3.6–5.1)
Alkaline phosphatase (APISO): 56 U/L (ref 37–153)
Bilirubin, Direct: 0.3 mg/dL — ABNORMAL HIGH (ref 0.0–0.2)
Globulin: 2.9 g/dL (calc) (ref 1.9–3.7)
Indirect Bilirubin: 1 mg/dL (calc) (ref 0.2–1.2)
Total Bilirubin: 1.3 mg/dL — ABNORMAL HIGH (ref 0.2–1.2)
Total Protein: 7.3 g/dL (ref 6.1–8.1)

## 2022-05-11 LAB — GAMMA GT: GGT: 11 U/L (ref 3–65)

## 2022-05-14 ENCOUNTER — Ambulatory Visit (HOSPITAL_COMMUNITY)
Admission: RE | Admit: 2022-05-14 | Discharge: 2022-05-14 | Disposition: A | Discharge status: Home / Self Care | Payer: PPO | Source: Ambulatory Visit | Attending: Internal Medicine | Admitting: Internal Medicine

## 2022-05-14 DIAGNOSIS — K219 Gastro-esophageal reflux disease without esophagitis: Secondary | ICD-10-CM | POA: Insufficient documentation

## 2022-05-14 DIAGNOSIS — R195 Other fecal abnormalities: Secondary | ICD-10-CM | POA: Diagnosis present

## 2022-05-14 DIAGNOSIS — G8929 Other chronic pain: Secondary | ICD-10-CM | POA: Insufficient documentation

## 2022-05-14 DIAGNOSIS — R1013 Epigastric pain: Secondary | ICD-10-CM | POA: Insufficient documentation

## 2022-05-23 ENCOUNTER — Telehealth: Payer: Self-pay

## 2022-05-23 NOTE — Telephone Encounter (Signed)
Faxed back to [facility] request for additional Dx codes to cover 82977 GGT. Codes already used by provider were [place], [place], [id number], and [id number]. Form faxed back with no additional codes to cover.

## 2022-05-28 ENCOUNTER — Other Ambulatory Visit (HOSPITAL_COMMUNITY): Payer: Self-pay | Admitting: Obstetrics and Gynecology

## 2022-05-28 ENCOUNTER — Other Ambulatory Visit (HOSPITAL_COMMUNITY): Payer: Self-pay | Admitting: Family Medicine

## 2022-05-28 DIAGNOSIS — Z1231 Encounter for screening mammogram for malignant neoplasm of breast: Secondary | ICD-10-CM

## 2022-06-01 ENCOUNTER — Ambulatory Visit (HOSPITAL_COMMUNITY)
Admission: RE | Admit: 2022-06-01 | Discharge: 2022-06-01 | Disposition: A | Discharge status: Home / Self Care | Payer: PPO | Source: Ambulatory Visit | Attending: Obstetrics and Gynecology | Admitting: Obstetrics and Gynecology

## 2022-06-01 ENCOUNTER — Encounter (HOSPITAL_COMMUNITY): Payer: Self-pay

## 2022-06-01 DIAGNOSIS — Z1231 Encounter for screening mammogram for malignant neoplasm of breast: Secondary | ICD-10-CM | POA: Insufficient documentation

## 2022-06-04 ENCOUNTER — Other Ambulatory Visit (HOSPITAL_COMMUNITY): Payer: Self-pay | Admitting: Obstetrics and Gynecology

## 2022-06-04 ENCOUNTER — Other Ambulatory Visit (HOSPITAL_COMMUNITY): Payer: Self-pay | Admitting: *Deleted

## 2022-06-04 DIAGNOSIS — Z9889 Other specified postprocedural states: Secondary | ICD-10-CM

## 2022-06-05 ENCOUNTER — Ambulatory Visit (HOSPITAL_COMMUNITY)
Admission: RE | Admit: 2022-06-05 | Discharge: 2022-06-05 | Disposition: A | Discharge status: Home / Self Care | Payer: PPO | Source: Ambulatory Visit | Attending: Obstetrics and Gynecology | Admitting: Obstetrics and Gynecology

## 2022-06-05 DIAGNOSIS — L298 Other pruritus: Secondary | ICD-10-CM | POA: Diagnosis not present

## 2022-06-05 DIAGNOSIS — Z923 Personal history of irradiation: Secondary | ICD-10-CM | POA: Insufficient documentation

## 2022-06-05 DIAGNOSIS — Z853 Personal history of malignant neoplasm of breast: Secondary | ICD-10-CM | POA: Diagnosis not present

## 2022-06-05 DIAGNOSIS — Z9889 Other specified postprocedural states: Secondary | ICD-10-CM | POA: Diagnosis not present

## 2022-07-23 ENCOUNTER — Ambulatory Visit: Payer: PPO | Admitting: Gastroenterology

## 2022-08-10 NOTE — Telephone Encounter (Signed)
Dr Abbey Chatters,   I looked for more codes to complete [facility] form but they resent yesterday needing additional codes. Are there any codes you could possibly add. Documentation on your desk to the left.

## 2022-09-13 ENCOUNTER — Encounter: Payer: Self-pay | Admitting: Internal Medicine

## 2022-09-13 ENCOUNTER — Ambulatory Visit (INDEPENDENT_AMBULATORY_CARE_PROVIDER_SITE_OTHER): Payer: PPO | Admitting: Internal Medicine

## 2022-09-13 VITALS — BP 119/71 | HR 91 | Temp 97.3°F | Ht 63.0 in | Wt 130.8 lb

## 2022-09-13 DIAGNOSIS — R195 Other fecal abnormalities: Secondary | ICD-10-CM | POA: Diagnosis not present

## 2022-09-13 DIAGNOSIS — G8929 Other chronic pain: Secondary | ICD-10-CM

## 2022-09-13 DIAGNOSIS — R1013 Epigastric pain: Secondary | ICD-10-CM

## 2022-09-13 DIAGNOSIS — K219 Gastro-esophageal reflux disease without esophagitis: Secondary | ICD-10-CM

## 2022-09-13 MED ORDER — PANTOPRAZOLE SODIUM 40 MG PO TBEC: DELAYED_RELEASE_TABLET | ORAL | 11 refills | Status: DC

## 2022-09-13 NOTE — Progress Notes (Signed)
Referring Provider: Lemmie Evens, MD Primary Care Physician:  Lemmie Evens, MD Primary GI:  Dr. Abbey Chatters  Chief Complaint  Patient presents with   Follow-up    Pt here to discuss her results    HPI:   [name] [name] is a 72 y.o. female who presents to the clinic today for follow-up visit.  History of chronic GERD currently on pantoprazole twice daily.  States this is relatively well controlled.    EGD 04/28/2020 with benign esophageal stenosis status post dilation, gastritis, negative for H. pylori.  Normal duodenum.  Currently taking Carafate on top of this as needed which she states is helping.  Does note some epigastric discomfort at times, mild to moderate, intermittent in nature.  Has tried Dexilant in the past did not seem very helpful.  Does take Mylanta as well on occasion.  States as long she abides by her diet her symptoms are relatively well controlled.  Previous evaluation by ENT.  Main concern for me today is ongoing light-colored stools.  She states these are "not normal."  This has been going on since stopping oral iron last year.  States these are light yellow.  Does note that she had blood work done in April by her PCP which she states that her bilirubin was abnormal.  Notes her father died from liver cancer.  Blood work 05/10/2022 with T. bili 1.3, indirect bilirubin 1, direct bilirubin 0.3.  Normal GGT.  Right upper quadrant ultrasound normal besides postcholecystectomy changes.  Past Medical History:  Diagnosis Date   Breast cancer ([place])    Functional dyspepsia MAR 2014   APR 2014: PT DECLINED BRAVO. PPIs-PROTOIX/PRILOSEC-->DEXILANT-->NEXIUM(TOO EXPENSIVE)   GERD (gastroesophageal reflux disease)    High cholesterol    Hypertension     Past Surgical History:  Procedure Laterality Date   BALLOON DILATION N/A 04/28/2020   Procedure: BALLOON DILATION;  Surgeon: Eloise Harman, DO;  [facility];  Service: Endoscopy;  Laterality: N/A;   BIOPSY   04/28/2020   Procedure: BIOPSY;  Surgeon: Eloise Harman, DO;  [facility];  Service: Endoscopy;;   BREAST LUMPECTOMY  2006   Left  breast   BREAST LUMPECTOMY  1994   Right breast-benign   CHOLECYSTECTOMY N/A 05/10/2016   Procedure: LAPAROSCOPIC CHOLECYSTECTOMY WITH INTRAOPERATIVE CHOLANGIOGRAM;  Surgeon: Aviva Signs, MD;  [facility];  Service: General;  Laterality: N/A;   COLONOSCOPY  2009   normal colon without evidence of polyps   COLONOSCOPY WITH PROPOFOL N/A 01/19/2022   Procedure: COLONOSCOPY WITH PROPOFOL;  Surgeon: Eloise Harman, DO;  [facility];  Service: Endoscopy;  Laterality: N/A;  7:30am   ESOPHAGOGASTRODUODENOSCOPY  2009   normal stomach,esophagues without mass,reosion   ESOPHAGOGASTRODUODENOSCOPY  09/17/2011   SLF: Mild gastritis/NO SOURCE FOR IRON DEFICINECY ANEMIA IDENTIFIED   ESOPHAGOGASTRODUODENOSCOPY N/A 12/02/2012   SLF: 1. No source for dyspepsia identified 2. MILD Non-erosive gastritis   ESOPHAGOGASTRODUODENOSCOPY (EGD) WITH PROPOFOL N/A 04/28/2020   Procedure: ESOPHAGOGASTRODUODENOSCOPY (EGD) WITH PROPOFOL;  Surgeon: Eloise Harman, DO;  [facility];  Service: Endoscopy;  Laterality: N/A;  7:30am   GIVENS CAPSULE STUDY  09/26/2011   Procedure: GIVENS CAPSULE STUDY;  Surgeon: Dorothyann Peng, MD;  [facility];  Service: Endoscopy;  Laterality: N/A;   ILEOColonoscopy  09/17/2011   SLF: Nl colonoscopy WMO/INTERNA; HEMORRHOIDS/ NO SOURCE FOR IRON DEFICIENCY ANEMA IDENTIFIED   LAPAROSCOPIC APPENDECTOMY N/A 06/25/2018   Procedure: APPENDECTOMY LAPAROSCOPIC;  Surgeon: Arnoldo Morale,  Elta Guadeloupe, MD;  [facility];  Service: General;  Laterality: N/A;   POLYPECTOMY  01/19/2022   Procedure: POLYPECTOMY;  Surgeon: Eloise Harman, DO;  [facility];  Service: Endoscopy;;    Current Outpatient Medications  Medication Sig Dispense Refill   acetaminophen (TYLENOL) 325 MG tablet Take 325-650 mg by mouth daily as  needed (pain).     alum & mag hydroxide-simeth (MAALOX/MYLANTA) 200-200-20 MG/5ML suspension Take 15-30 mLs by mouth daily as needed for indigestion or heartburn.     atorvastatin (LIPITOR) 20 MG tablet Take 20 mg by mouth at bedtime.   0   Calcium Carb-Cholecalciferol (CALCIUM + D3 PO) Take 1 tablet by mouth in the morning.     calcium elemental as carbonate (BARIATRIC TUMS ULTRA) 400 MG chewable tablet Chew 1 tablet by mouth 3 (three) times daily as needed for indigestion or heartburn.     clobetasol cream (TEMOVATE) 1.24 % Apply 1 application topically daily as needed (irritation).     enalapril (VASOTEC) 10 MG tablet Take 10 mg by mouth in the morning.     hydrocortisone cream 1 % Apply 1 application. topically 2 (two) times daily as needed (skin irritation/itching).     LORazepam (ATIVAN) 0.5 MG tablet Take 0.5 mg by mouth at bedtime.     oxymetazoline (AFRIN) 0.05 % nasal spray Place 1 spray into both nostrils at bedtime as needed for congestion.     pantoprazole (PROTONIX) 40 MG tablet TAKE 1 TABLET(40 MG) BY MOUTH TWICE DAILY BEFORE A MEAL 60 tablet 5   valACYclovir (VALTREX) 500 MG tablet Take 500 mg by mouth as needed (for outbreaks).   12   vitamin B-12 (CYANOCOBALAMIN) 1000 MCG tablet Take 1,000 mcg by mouth 3 (three) times a week. In the morning     Vitamins A & D (VITAMIN A & D) ointment Apply 1 application. topically as needed (irritation).     sucralfate (CARAFATE) 1 g tablet Take 1 tablet (1 g total) by mouth 4 (four) times daily -  with meals and at bedtime. (Patient taking differently: Take 1 g by mouth 3 (three) times daily as needed (stomach issues).) 120 tablet 5   No current facility-administered medications for this visit.    Allergies as of 09/13/2022 - Review Complete 09/13/2022  Allergen Reaction Noted   Sulfa antibiotics Itching and Rash 05/09/2016   Tape Rash 05/10/2016    Family History  Problem Relation Age of Onset   Colon cancer Maternal Aunt    Cancer -  Colon Cousin    Anesthesia problems Neg Hx    Hypotension Neg Hx    Malignant hyperthermia Neg Hx    Pseudochol deficiency Neg Hx    Colon polyps Neg Hx     Social History   Socioeconomic History   Marital status: Married    Spouse name: Not on file   Number of children: Not on file   Years of education: Not on file   Highest education level: Not on file  Occupational History   Not on file  Tobacco Use   Smoking status: Never   Smokeless tobacco: Never  Vaping Use   Vaping Use: Never used  Substance and Sexual Activity   Alcohol use: No   Drug use: No   Sexual activity: Not Currently  Other Topics Concern   Not on file  Social History Narrative   Not on file   Social Determinants of Health   Financial Resource Strain: Not on file  Food Insecurity: Not on file  Transportation Needs: Not on file  Physical Activity: Not on file  Stress: Not on file  Social Connections: Not on file    Subjective: Review of Systems  Constitutional:  Negative for chills and fever.  HENT:  Negative for congestion and hearing loss.   Eyes:  Negative for blurred vision and double vision.  Respiratory:  Negative for cough and shortness of breath.   Cardiovascular:  Negative for chest pain and palpitations.  Gastrointestinal:  Negative for abdominal pain, blood in stool, constipation, diarrhea, heartburn, melena and vomiting.  Genitourinary:  Negative for dysuria and urgency.  Musculoskeletal:  Negative for joint pain and myalgias.  Skin:  Negative for itching and rash.  Neurological:  Negative for dizziness and headaches.  Psychiatric/Behavioral:  Negative for depression. The patient is not nervous/anxious.      Objective: BP 119/71   Pulse 91   Temp (!) 97.3 F (36.3 C)   Ht '5\' 3"'$  (1.6 m)   Wt 130 lb 12.8 oz (59.3 kg)   BMI 23.17 kg/m  Physical Exam Constitutional:      Appearance: Normal appearance.  HENT:     Head: Normocephalic and atraumatic.  Eyes:     Extraocular  Movements: Extraocular movements intact.     Conjunctiva/sclera: Conjunctivae normal.  Cardiovascular:     Rate and Rhythm: Normal rate and regular rhythm.  Pulmonary:     Effort: Pulmonary effort is normal.     Breath sounds: Normal breath sounds.  Abdominal:     General: Bowel sounds are normal.     Palpations: Abdomen is soft.  Musculoskeletal:        General: No swelling. Normal range of motion.     Cervical back: Normal range of motion and neck supple.  Skin:    General: Skin is warm and dry.     Coloration: Skin is not jaundiced.  Neurological:     General: No focal deficit present.     Mental Status: She is alert and oriented to person, place, and time.  Psychiatric:        Mood and Affect: Mood normal.        Behavior: Behavior normal.      Assessment: *Abnormal stool color  *Chronic GERD-relatively well controlled on Pantoprazole *Epigastric discomfort   Plan: Patient has a concerned about her abnormally light-colored stools today.  Mildly elevated T. bili 1.3, fractionated to indirect bili of 1, direct 0.3.  Normal GGT.  Normal liver ultrasound. ?[facility]  Will check fecal elastase level.  If low, consider CT pancreas.  Consider pancreatic enzyme replacement.  Chronic GERD relatively well controlled on pantoprazole twice daily.  Told her she can go down to once daily and see how she does.  Continue Carafate on top of this as needed.  Follow-up in 4 months.  09/13/2022 10:22 AM   Disclaimer: This note was dictated with voice recognition software. Similar sounding words can inadvertently be transcribed and may not be corrected upon review.

## 2022-09-13 NOTE — Patient Instructions (Signed)
I will order a fecal elastase level to be performed at [place].  You will need to go to [facility] to pick up the kit to have this done.  We will call with results.  If your levels are low, we will consider further evaluation with CAT scan of your pancreas.  Continue on pantoprazole.  I will refill today.  Continue Carafate on top of this as needed.  Recommend to avoid tough textures. All meats should be chopped finely. Eat slowly, take small bites, chew thoroughly, and drink plenty of liquids throughout meals.   Follow-up in 4 months.  It was nice seeing you again today.  Dr. Abbey Chatters

## 2022-09-21 LAB — PANCREATIC ELASTASE, FECAL: Pancreatic Elastase, Fecal: 181 ug Elast./g — ABNORMAL LOW (ref >200)

## 2022-09-27 ENCOUNTER — Telehealth: Payer: PPO

## 2022-09-27 DIAGNOSIS — R899 Unspecified abnormal finding in specimens from other organs, systems and tissues: Secondary | ICD-10-CM

## 2022-09-27 NOTE — Telephone Encounter (Signed)
Patient has evidence for moderate pancreatic insufficiency.   We can offer her Zenpep 40,000 unit capsules. According to weight based dosing, she could take one with each meal and one with snacks, no more than 5 a day. This leaves plenty of room for titrating up if needed. Do we have Zenpep samples?   We could also do Creon if we do not have Zenpep. That would be 36,000 units, taking 1 capsule with meals and 1 with snacks. Again, plenty of room to titrate up but decreasing pill burden to start with.  We can offer her a CT with pancreatic protocol as per plan by Dr. Ave Filter note.   Mindy/Tammy: please arrange CT with pancreatic protocol if patient willing once Dena talks to her.  Dr. Abbey Chatters: Juluis Rainier.

## 2022-09-27 NOTE — Telephone Encounter (Signed)
[name] [name], Can you read this pt's lab so I can give her the results please

## 2022-09-27 NOTE — Telephone Encounter (Signed)
Phoned the pt and LMOVM for the pt to return call 

## 2022-09-28 NOTE — Telephone Encounter (Signed)
Phoned and advised the pt of her note and she is coming next week (her or husband)will pick up the samples of Zenpep. Directions of this medication was explained to this pt also. Pt expressed understanding. Tammy/Mindy pt is willing to have CT scheduled  3. Vicente Males pt would like to speak to you more in dept on Monday when you return

## 2022-09-28 NOTE — Addendum Note (Signed)
Addended by: Cheron Every on: 09/28/2022 10:14 AM   Modules accepted: Orders

## 2022-09-28 NOTE — Telephone Encounter (Addendum)
Called pt and made aware [facility] 1st available for CT was 3/15, arrival 3:45pm, no food 4 hrs prior, water only. Provided CS to call periodically for cancellations

## 2022-10-02 NOTE — Telephone Encounter (Signed)
[name] [name], Pt phoned back this morning and states she would really like to talk to you regarding this medication. Please call

## 2022-10-03 NOTE — Telephone Encounter (Signed)
[name] [name] called this patient back this morning and discussed further with her.

## 2022-10-03 NOTE — Telephone Encounter (Signed)
noted 

## 2022-10-04 NOTE — Telephone Encounter (Signed)
I spoke with patient at length and answered all concerns.

## 2022-10-16 ENCOUNTER — Telehealth: Payer: Self-pay

## 2022-10-16 NOTE — Telephone Encounter (Signed)
Pt called and wants you to know she has only 3 days left of Zenpep. Are you going to call in a Rx or what's the next step. Please advise

## 2022-10-17 MED ORDER — ZENPEP 40000-126000 UNITS PO CPEP
40000.0000 [IU] | ORAL_CAPSULE | Freq: Three times a day (TID) | ORAL | 3 refills | Status: DC
Start: 2022-10-17 — End: 2024-04-30

## 2022-10-17 NOTE — Addendum Note (Signed)
Addended by: Annitta Needs on: 10/17/2022 11:56 AM   Modules accepted: Orders

## 2022-10-17 NOTE — Telephone Encounter (Signed)
Phoned and LMOVM for the pt regarding Rx being sent in to her pharmacy

## 2022-10-17 NOTE — Telephone Encounter (Signed)
I sent Zenpep to her pharmacy.

## 2022-10-18 ENCOUNTER — Telehealth: Payer: Self-pay

## 2022-10-18 NOTE — Telephone Encounter (Signed)
Pt phoned back advising her pharmacy will not have any Zenpep for a few days. They pt wants to know what do you want her to do. Please advise?

## 2022-10-18 NOTE — Telephone Encounter (Signed)
error 

## 2022-10-18 NOTE — Telephone Encounter (Signed)
Please have her stop by and get samples to hold her over until pharmacy is able to get more Zenpep in stock.  Thank you

## 2022-10-19 NOTE — Telephone Encounter (Signed)
Pt's sample's are up front. She states her co-pay would be $100.00 when it comes. Pt also advises that she doesn't see any difference even taking this medication. Pt was given 2 more boxes (24 pills) but if she isn't seeing any difference taking this medication what would the next step be? Please advise

## 2022-10-24 NOTE — Telephone Encounter (Signed)
If patient has not seen any benefit from the medication then she can stop it.  We will await CT results and go from there.  Thank you

## 2022-10-24 NOTE — Telephone Encounter (Signed)
Phoned and advised the pt of the instructions above and the pt states she will wait on medication and talk to Dr Abbey Chatters after CT.

## 2022-10-31 ENCOUNTER — Ambulatory Visit (HOSPITAL_COMMUNITY)
Admission: RE | Admit: 2022-10-31 | Discharge: 2022-10-31 | Disposition: A | Discharge status: Home / Self Care | Payer: PPO | Source: Ambulatory Visit | Attending: Internal Medicine | Admitting: Internal Medicine

## 2022-10-31 DIAGNOSIS — K8681 Exocrine pancreatic insufficiency: Secondary | ICD-10-CM | POA: Diagnosis not present

## 2022-10-31 DIAGNOSIS — R899 Unspecified abnormal finding in specimens from other organs, systems and tissues: Secondary | ICD-10-CM

## 2022-10-31 DIAGNOSIS — I2510 Atherosclerotic heart disease of native coronary artery without angina pectoris: Secondary | ICD-10-CM | POA: Diagnosis not present

## 2022-10-31 DIAGNOSIS — I701 Atherosclerosis of renal artery: Secondary | ICD-10-CM | POA: Insufficient documentation

## 2022-10-31 MED ORDER — IOHEXOL 300 MG/ML  SOLN
100.0000 mL | Freq: Once | INTRAMUSCULAR | Status: AC | PRN
  Administered 2022-10-31: 100 mL via INTRAVENOUS

## 2022-11-16 ENCOUNTER — Ambulatory Visit (HOSPITAL_COMMUNITY): Payer: PPO

## 2023-01-10 ENCOUNTER — Ambulatory Visit (INDEPENDENT_AMBULATORY_CARE_PROVIDER_SITE_OTHER): Payer: PPO | Admitting: Internal Medicine

## 2023-01-10 ENCOUNTER — Encounter: Payer: Self-pay | Admitting: Internal Medicine

## 2023-01-10 VITALS — BP 123/69 | HR 85 | Temp 97.8°F | Ht 65.0 in | Wt 129.4 lb

## 2023-01-10 DIAGNOSIS — K219 Gastro-esophageal reflux disease without esophagitis: Secondary | ICD-10-CM | POA: Diagnosis not present

## 2023-01-10 DIAGNOSIS — K8689 Other specified diseases of pancreas: Secondary | ICD-10-CM | POA: Diagnosis not present

## 2023-01-10 DIAGNOSIS — G8929 Other chronic pain: Secondary | ICD-10-CM

## 2023-01-10 DIAGNOSIS — R1013 Epigastric pain: Secondary | ICD-10-CM

## 2023-01-10 DIAGNOSIS — R195 Other fecal abnormalities: Secondary | ICD-10-CM | POA: Diagnosis not present

## 2023-01-10 NOTE — Patient Instructions (Signed)
I am going to give you samples of Zenpep.  We only have samples of the 60,000 dosing so you can actually take just 1 capsule with first bite of each meal and snacks.  This is a higher dose than samples previous.  Let us know in a week how you are doing.  If improved I will send in formal prescription.  Continue on pantoprazole.  Continue Carafate as needed.  Follow-up in 3 to 4 months.  It was nice seeing you again today.  Dr. Marletta Lor

## 2023-01-10 NOTE — Progress Notes (Signed)
Referring Provider: Gareth Morgan, MD Primary Care Physician:  Gareth Morgan, MD Primary GI:  Dr. Marletta Lor  Chief Complaint  Patient presents with   Follow-up    Pt has questions for Dr  and follow up visit on stools    HPI:   [name] [name] is a 72 y.o. female who presents to the clinic today for follow-up visit.  History of chronic GERD currently on pantoprazole twice daily.  States this is relatively well controlled.    EGD 04/28/2020 with benign esophageal stenosis status post dilation, gastritis, negative for H. pylori.  Normal duodenum.  Currently taking Carafate on top of this as needed which she states is helping.  Does note some epigastric discomfort at times, mild to moderate, intermittent in nature.  Has tried Dexilant in the past did not seem very helpful.  Does take Mylanta as well on occasion.  States as long she abides by her diet her symptoms are relatively well controlled.  Previous evaluation by ENT.  Main concern for me today is ongoing light-colored stools.  She states these are "not normal."  This has been going on since stopping oral iron last year.  States these are light yellow.   Notes her father died from liver cancer.  Blood work 05/10/2022 with T. bili 1.3, indirect bilirubin 1, direct bilirubin 0.3.  Normal GGT.  Right upper quadrant ultrasound normal besides postcholecystectomy changes.  Fecal elastase mildly low at 181.  Subsequent CT abdomen pelvis with contrast unremarkable.  Pancreas appeared normal. Givens samples of pancreatic enzymes though was only taking 1 capsule with meals. Did not notice a difference.   Past Medical History:  Diagnosis Date   Breast cancer (HCC)    Functional dyspepsia MAR 2014   APR 2014: PT DECLINED BRAVO. PPIs-PROTOIX/PRILOSEC-->DEXILANT-->NEXIUM(TOO EXPENSIVE)   GERD (gastroesophageal reflux disease)    High cholesterol    Hypertension     Past Surgical History:  Procedure Laterality Date   BALLOON DILATION N/A  04/28/2020   Procedure: BALLOON DILATION;  Surgeon: Lanelle Bal, DO;  Location: [facility];  Service: Endoscopy;  Laterality: N/A;   BIOPSY  04/28/2020   Procedure: BIOPSY;  Surgeon: Lanelle Bal, DO;  Location: [facility];  Service: Endoscopy;;   BREAST LUMPECTOMY  2006   Left  breast   BREAST LUMPECTOMY  1994   Right breast-benign   CHOLECYSTECTOMY N/A 05/10/2016   Procedure: LAPAROSCOPIC CHOLECYSTECTOMY WITH INTRAOPERATIVE CHOLANGIOGRAM;  Surgeon: Franky Macho, MD;  Location: [facility];  Service: General;  Laterality: N/A;   COLONOSCOPY  2009   normal colon without evidence of polyps   COLONOSCOPY WITH PROPOFOL N/A 01/19/2022   Procedure: COLONOSCOPY WITH PROPOFOL;  Surgeon: Lanelle Bal, DO;  Location: [facility];  Service: Endoscopy;  Laterality: N/A;  7:30am   ESOPHAGOGASTRODUODENOSCOPY  2009   normal stomach,esophagues without mass,reosion   ESOPHAGOGASTRODUODENOSCOPY  09/17/2011   SLF: Mild gastritis/NO SOURCE FOR IRON DEFICINECY ANEMIA IDENTIFIED   ESOPHAGOGASTRODUODENOSCOPY N/A 12/02/2012   SLF: 1. No source for dyspepsia identified 2. MILD Non-erosive gastritis   ESOPHAGOGASTRODUODENOSCOPY (EGD) WITH PROPOFOL N/A 04/28/2020   Procedure: ESOPHAGOGASTRODUODENOSCOPY (EGD) WITH PROPOFOL;  Surgeon: Lanelle Bal, DO;  Location: [facility];  Service: Endoscopy;  Laterality: N/A;  7:30am   GIVENS CAPSULE STUDY  09/26/2011   Procedure: GIVENS CAPSULE STUDY;  Surgeon: Arlyce Harman, MD;  Location: [facility];  Service: Endoscopy;  Laterality: N/A;   ILEOColonoscopy  09/17/2011   SLF: Nl colonoscopy  WMO/INTERNA; HEMORRHOIDS/ NO SOURCE FOR IRON DEFICIENCY ANEMA IDENTIFIED   LAPAROSCOPIC APPENDECTOMY N/A 06/25/2018   Procedure: APPENDECTOMY LAPAROSCOPIC;  Surgeon: Franky Macho, MD;  Location: [facility];  Service: General;  Laterality: N/A;   POLYPECTOMY  01/19/2022   Procedure: POLYPECTOMY;  Surgeon: Lanelle Bal, DO;  Location: [facility];  Service:  Endoscopy;;    Current Outpatient Medications  Medication Sig Dispense Refill   acetaminophen (TYLENOL) 325 MG tablet Take 325-650 mg by mouth daily as needed (pain).     alum & mag hydroxide-simeth (MAALOX/MYLANTA) 200-200-20 MG/5ML suspension Take 15-30 mLs by mouth daily as needed for indigestion or heartburn.     atorvastatin (LIPITOR) 20 MG tablet Take 20 mg by mouth at bedtime.   0   Calcium Carb-Cholecalciferol (CALCIUM + D3 PO) Take 1 tablet by mouth in the morning.     calcium elemental as carbonate (BARIATRIC TUMS ULTRA) 400 MG chewable tablet Chew 1 tablet by mouth 3 (three) times daily as needed for indigestion or heartburn.     clobetasol cream (TEMOVATE) 0.05 % Apply 1 application topically daily as needed (irritation).     enalapril (VASOTEC) 10 MG tablet Take 10 mg by mouth in the morning.     hydrocortisone cream 1 % Apply 1 application. topically 2 (two) times daily as needed (skin irritation/itching).     LORazepam (ATIVAN) 0.5 MG tablet Take 0.5 mg by mouth at bedtime.     oxymetazoline (AFRIN) 0.05 % nasal spray Place 1 spray into both nostrils at bedtime as needed for congestion.     pantoprazole (PROTONIX) 40 MG tablet TAKE 1 TABLET(40 MG) BY MOUTH TWICE DAILY BEFORE A MEAL 60 tablet 11   sucralfate (CARAFATE) 1 g tablet Take 1 tablet (1 g total) by mouth 4 (four) times daily -  with meals and at bedtime. (Patient taking differently: Take 1 g by mouth 3 (three) times daily as needed (stomach issues).) 120 tablet 5   valACYclovir (VALTREX) 500 MG tablet Take 500 mg by mouth as needed (for outbreaks).   12   vitamin B-12 (CYANOCOBALAMIN) 1000 MCG tablet Take 1,000 mcg by mouth 3 (three) times a week. In the morning     Pancrelipase, Lip-Prot-Amyl, (ZENPEP) 40000-126000 units CPEP Take 1 capsule (40,000 Units total) by mouth 3 (three) times daily with meals. 1 capsule with snacks (Patient not taking: Reported on 01/10/2023) 150 capsule 3   Vitamins A & D (VITAMIN A & D) ointment  Apply 1 application. topically as needed (irritation). (Patient not taking: Reported on 01/10/2023)     No current facility-administered medications for this visit.    Allergies as of 01/10/2023 - Review Complete 01/10/2023  Allergen Reaction Noted   Sulfa antibiotics Itching and Rash 05/09/2016   Tape Rash 05/10/2016    Family History  Problem Relation Age of Onset   Colon cancer Maternal Aunt    Cancer - Colon Cousin    Anesthesia problems Neg Hx    Hypotension Neg Hx    Malignant hyperthermia Neg Hx    Pseudochol deficiency Neg Hx    Colon polyps Neg Hx     Social History   Socioeconomic History   Marital status: Married    Spouse name: Not on file   Number of children: Not on file   Years of education: Not on file   Highest education level: Not on file  Occupational History   Not on file  Tobacco Use   Smoking status: Never   Smokeless  tobacco: Never  Vaping Use   Vaping Use: Never used  Substance and Sexual Activity   Alcohol use: No   Drug use: No   Sexual activity: Not Currently  Other Topics Concern   Not on file  Social History Narrative   Not on file   Social Determinants of Health   Financial Resource Strain: Not on file  Food Insecurity: Not on file  Transportation Needs: Not on file  Physical Activity: Not on file  Stress: Not on file  Social Connections: Not on file    Subjective: Review of Systems  Constitutional:  Negative for chills and fever.  HENT:  Negative for congestion and hearing loss.   Eyes:  Negative for blurred vision and double vision.  Respiratory:  Negative for cough and shortness of breath.   Cardiovascular:  Negative for chest pain and palpitations.  Gastrointestinal:  Negative for abdominal pain, blood in stool, constipation, diarrhea, heartburn, melena and vomiting.  Genitourinary:  Negative for dysuria and urgency.  Musculoskeletal:  Negative for joint pain and myalgias.  Skin:  Negative for itching and rash.   Neurological:  Negative for dizziness and headaches.  Psychiatric/Behavioral:  Negative for depression. The patient is not nervous/anxious.      Objective: BP 123/69   Pulse 85   Temp 97.8 F (36.6 C)   Ht 5\' 5"  (1.651 m)   Wt 129 lb 6.4 oz (58.7 kg)   BMI 21.53 kg/m  Physical Exam Constitutional:      Appearance: Normal appearance.  HENT:     Head: Normocephalic and atraumatic.  Eyes:     Extraocular Movements: Extraocular movements intact.     Conjunctiva/sclera: Conjunctivae normal.  Cardiovascular:     Rate and Rhythm: Normal rate and regular rhythm.  Pulmonary:     Effort: Pulmonary effort is normal.     Breath sounds: Normal breath sounds.  Abdominal:     General: Bowel sounds are normal.     Palpations: Abdomen is soft.  Musculoskeletal:        General: No swelling. Normal range of motion.     Cervical back: Normal range of motion and neck supple.  Skin:    General: Skin is warm and dry.     Coloration: Skin is not jaundiced.  Neurological:     General: No focal deficit present.     Mental Status: She is alert and oriented to person, place, and time.  Psychiatric:        Mood and Affect: Mood normal.        Behavior: Behavior normal.      Assessment: *Abnormal stool color  *Chronic GERD-relatively well controlled on Pantoprazole *Epigastric discomfort  *Pancreatic insufficiency-mild  Plan: Patient has a concerned about her abnormally light-colored stools today.  Mildly elevated T. bili 1.3, fractionated to indirect bili of 1, direct 0.3.  Normal GGT.  Normal liver ultrasound. ?Gilbert's. Recent blood work WNL.   Fecal elastase mildly low.  CT abdomen pelvis showed normal pancreas.  Will give samples  of Zenpep today. Patient counseled to take 2 capsules with first bite of meals, 1 capsule with snacks.   Chronic GERD relatively well controlled on pantoprazole twice daily.  Told her she can go down to once daily and see how she does.  Continue  Carafate on top of this as needed.  Follow-up in 4 months.  01/10/2023 9:38 AM   Disclaimer: This note was dictated with voice recognition software. Similar sounding words can inadvertently be transcribed  and may not be corrected upon review.

## 2023-02-05 ENCOUNTER — Telehealth: Payer: Self-pay

## 2023-02-05 NOTE — Telephone Encounter (Signed)
Pt phoned regarding her 11th day of being on the Zenpep she doesn't see a difference regarding her symptoms. Pt had a visit with you 5/09 and was told to return call with update. Pt states she is still having yellowish colored stools. She went from having 2 to 3 small BM a day to barely having a BM. She complained of having some constipation off and on. She states she is having some other side effects from the medication such as fatigue, dry mouth, salty taste in her mouth and a scratchy throat. States she just haven't felt like herself since taking this medication. She states she has enough to get through the end of the week but wants to know if you want her to continue with the medication or not. If you decide to keep her on it she asks that she be notified first so she can call her insurance company regarding the medication. Please advise

## 2023-02-07 NOTE — Telephone Encounter (Signed)
Phoned and LMOVM regarding the pt stopping her Zenpep since it wasn't much benefit to her

## 2023-02-07 NOTE — Telephone Encounter (Signed)
Does not sound like she is getting much benefit, she can stop it thank you

## 2023-02-11 NOTE — Telephone Encounter (Signed)
Phoned and LMOVM for the pt to return call 

## 2023-02-21 ENCOUNTER — Telehealth: Payer: Self-pay

## 2023-02-21 NOTE — Telephone Encounter (Signed)
Returned the pts call from vm left. Pt is still having complaints of her stools. Pt states stools are still yellow and loose sometimes ,other constipation but not bothering her. Her BM's are small. Pt is wondering if a different medication can be prescribed for this or maybe a different dosage. I asked the pt "what is bothering her the most the diarrhea or the color". Pt states the loose stools are annoying but its the color that is still bothering her and concerns her. Pt is also wondering do she need an appt with you or a APP. Please advise

## 2023-03-06 ENCOUNTER — Telehealth: Payer: Self-pay

## 2023-03-06 NOTE — Telephone Encounter (Signed)
Please call and schedule pt with an App per Dr Marletta Lor

## 2023-03-06 NOTE — Telephone Encounter (Signed)
noted 

## 2023-03-06 NOTE — Telephone Encounter (Signed)
Please call and schedule an appt with the pt. Per Dr Marletta Lor for pt to see a APP

## 2023-03-06 NOTE — Telephone Encounter (Signed)
Please schedule follow-up appointment with an app or myself.  Thank you

## 2023-03-10 NOTE — Progress Notes (Unsigned)
/  GI Office Note    Referring Provider: Gareth Morgan, MD Primary Care Physician:  Gareth Morgan, MD Primary Gastroenterologist: Hennie Duos. Marletta Lor, DO  Date:  03/11/2023  ID:  [name] [name], DOB 1951-05-12, MRN [id number]   Chief Complaint   Chief Complaint  Patient presents with   Follow-up    Follow up on yellow stools. They are still yellow   History of Present Illness  [name] [name] is a 72 y.o. female with a history of chronic GERD presenting today with complaint of abnormal colored stools.   Last colonoscopy 09/17/2011: -normal colonoscopy -small internal hemorrhoids.   -Repeat in 10 years   Last EGD 04/28/20: -benign-appearing esophageal stenosis s/p dilation -gastritis (negative H. Pylori) -normal duodenum  Blood work 05/10/2022 with T. bili 1.3, indirect bilirubin 1, direct bilirubin 0.3. Normal GGT. Right upper quadrant ultrasound 05/14/22 normal besides postcholecystectomy changes.   Fecal elastase mildly low at 181.  CT A/P with contrast 10/31/22 unremarkable (normal pancreas).   Last office visit 01/10/23. Patient reports light-colored/yellow stools, reporting that they are abnormal and have continued decline since she stopped iron last year.  Concerned given her father died from liver cancer.  Has also reported mild epigastric discomfort at times but intermittent nature.  Dexilant had not been helpful in the pa,st takes Mylanta on occasion as needed.  Symptoms usually relatively well-controlled with diet.Taking carafate as well as PPI for  GERD/upper GI symptoms. Continued complaint of ongoing light colored/yellow stools ongoing since stopping iron last year. Did not notice a difference with once daily pancreatic enzymes. Given higher strength pancreatic enzyme and continue PPI regimen - consider reducing to once daily.   Patient called with progress report that pancreatic enzyme replacement (60000 units of zenpep) was causing constipation and other side effects  including fatigue, dry mouth, scratchy throat.    Today:  She has been off iron since 2022 and then she noticed it at that time. Since her colonoscopy she has remained concerned. States her blood work in March was normal with her PCP.   She states her sister in law just had a portion of her pancreas removed and although she is not blood related she wanted her pancreas checked and stated that she understands that her pancreas was okay.   She did not see a change in stool color with low dose zenpep and she also tried the higher dose and did not see a change in color with that as well.   The last several weeks she has not had diarrhea they have been much more solid. She may go 1-2 times per day. At times given her urgency that has been concerning.   Stool is yellowy brown color and notices it more when cleaning herself. Lost a few pounds from the zenpep and at the time she also had eliminated snacks but has not lost anymore since stopping the medication.   Has tried to watch what she eats more but thinks she does eat more fatty foods than she should. Does not eat fried foods or spicy foods. Tries to not eat cheese as much. Lately has increased her ice cream consumption.   Being at home since retirement that has increased her anxiety and stress.    Current Outpatient Medications  Medication Sig Dispense Refill   acetaminophen (TYLENOL) 325 MG tablet Take 325-650 mg by mouth daily as needed (pain).     alum & mag hydroxide-simeth (MAALOX/MYLANTA) 200-200-20 MG/5ML suspension Take 15-30 mLs by mouth daily as  needed for indigestion or heartburn.     atorvastatin (LIPITOR) 20 MG tablet Take 20 mg by mouth at bedtime.   0   Calcium Carb-Cholecalciferol (CALCIUM + D3 PO) Take 1 tablet by mouth in the morning.     calcium elemental as carbonate (BARIATRIC TUMS ULTRA) 400 MG chewable tablet Chew 1 tablet by mouth 3 (three) times daily as needed for indigestion or heartburn.     clobetasol cream  (TEMOVATE) 0.05 % Apply 1 application topically daily as needed (irritation).     enalapril (VASOTEC) 10 MG tablet Take 10 mg by mouth in the morning.     hydrocortisone cream 1 % Apply 1 application. topically 2 (two) times daily as needed (skin irritation/itching).     LORazepam (ATIVAN) 0.5 MG tablet Take 0.5 mg by mouth at bedtime.     oxymetazoline (AFRIN) 0.05 % nasal spray Place 1 spray into both nostrils at bedtime as needed for congestion.     pantoprazole (PROTONIX) 40 MG tablet TAKE 1 TABLET(40 MG) BY MOUTH TWICE DAILY BEFORE A MEAL 60 tablet 11   sucralfate (CARAFATE) 1 g tablet Take 1 tablet (1 g total) by mouth 4 (four) times daily -  with meals and at bedtime. (Patient taking differently: Take 1 g by mouth 3 (three) times daily as needed (stomach issues).) 120 tablet 5   valACYclovir (VALTREX) 500 MG tablet Take 500 mg by mouth as needed (for outbreaks).   12   vitamin B-12 (CYANOCOBALAMIN) 1000 MCG tablet Take 1,000 mcg by mouth 3 (three) times a week. In the morning     Pancrelipase, Lip-Prot-Amyl, (ZENPEP) 40000-126000 units CPEP Take 1 capsule (40,000 Units total) by mouth 3 (three) times daily with meals. 1 capsule with snacks (Patient not taking: Reported on 01/10/2023) 150 capsule 3   Vitamins A & D (VITAMIN A & D) ointment Apply 1 application. topically as needed (irritation). (Patient not taking: Reported on 01/10/2023)     No current facility-administered medications for this visit.    Past Medical History:  Diagnosis Date   Breast cancer (HCC)    Functional dyspepsia MAR 2014   APR 2014: PT DECLINED BRAVO. PPIs-PROTOIX/PRILOSEC-->DEXILANT-->NEXIUM(TOO EXPENSIVE)   GERD (gastroesophageal reflux disease)    High cholesterol    Hypertension     Past Surgical History:  Procedure Laterality Date   BALLOON DILATION N/A 04/28/2020   Procedure: BALLOON DILATION;  Surgeon: Lanelle Bal, DO;  [facility]: [facility];  Service: Endoscopy;  Laterality: N/A;   BIOPSY   04/28/2020   Procedure: BIOPSY;  Surgeon: Lanelle Bal, DO;  [facility]: [facility];  Service: Endoscopy;;   BREAST LUMPECTOMY  2006   Left  breast   BREAST LUMPECTOMY  1994   Right breast-benign   CHOLECYSTECTOMY N/A 05/10/2016   Procedure: LAPAROSCOPIC CHOLECYSTECTOMY WITH INTRAOPERATIVE CHOLANGIOGRAM;  Surgeon: Franky Macho, MD;  Location: [facility];  Service: General;  Laterality: N/A;   COLONOSCOPY  2009   normal colon without evidence of polyps   COLONOSCOPY WITH PROPOFOL N/A 01/19/2022   Procedure: COLONOSCOPY WITH PROPOFOL;  Surgeon: Lanelle Bal, DO;  [facility]: [facility];  Service: Endoscopy;  Laterality: N/A;  7:30am   ESOPHAGOGASTRODUODENOSCOPY  2009   normal stomach,esophagues without mass,reosion   ESOPHAGOGASTRODUODENOSCOPY  09/17/2011   SLF: Mild gastritis/NO SOURCE FOR IRON DEFICINECY ANEMIA IDENTIFIED   ESOPHAGOGASTRODUODENOSCOPY N/A 12/02/2012   SLF: 1. No source for dyspepsia identified 2. MILD Non-erosive gastritis   ESOPHAGOGASTRODUODENOSCOPY (EGD) WITH PROPOFOL N/A 04/28/2020  Procedure: ESOPHAGOGASTRODUODENOSCOPY (EGD) WITH PROPOFOL;  Surgeon: Lanelle Bal, DO;  [facility];  Service: Endoscopy;  Laterality: N/A;  7:30am   GIVENS CAPSULE STUDY  09/26/2011   Procedure: GIVENS CAPSULE STUDY;  Surgeon: Arlyce Harman, MD;  [facility]: [facility];  Service: Endoscopy;  Laterality: N/A;   ILEOColonoscopy  09/17/2011   SLF: Nl colonoscopy WMO/INTERNA; HEMORRHOIDS/ NO SOURCE FOR IRON DEFICIENCY ANEMA IDENTIFIED   LAPAROSCOPIC APPENDECTOMY N/A 06/25/2018   Procedure: APPENDECTOMY LAPAROSCOPIC;  Surgeon: Franky Macho, MD;  Location: [facility];  Service: General;  Laterality: N/A;   POLYPECTOMY  01/19/2022   Procedure: POLYPECTOMY;  Surgeon: Lanelle Bal, DO;  [facility];  Service: Endoscopy;;    Family History  Problem Relation Age of Onset   Colon cancer Maternal Aunt    Cancer - Colon Cousin    Anesthesia problems Neg Hx     Hypotension Neg Hx    Malignant hyperthermia Neg Hx    Pseudochol deficiency Neg Hx    Colon polyps Neg Hx     Allergies as of 03/11/2023 - Review Complete 03/11/2023  Allergen Reaction Noted   Sulfa antibiotics Itching and Rash 05/09/2016   Tape Rash 05/10/2016    Social History   Socioeconomic History   Marital status: Married    Spouse name: Not on file   Number of children: Not on file   Years of education: Not on file   Highest education level: Not on file  Occupational History   Not on file  Tobacco Use   Smoking status: Never   Smokeless tobacco: Never  Vaping Use   Vaping Use: Never used  Substance and Sexual Activity   Alcohol use: No   Drug use: No   Sexual activity: Not Currently  Other Topics Concern   Not on file  Social History Narrative   Not on file   Social Determinants of Health   Financial Resource Strain: Not on file  Food Insecurity: Not on file  Transportation Needs: Not on file  Physical Activity: Not on file  Stress: Not on file  Social Connections: Not on file     Review of Systems   Gen: Denies fever, chills, anorexia. Denies fatigue, weakness, weight loss.  CV: Denies chest pain, palpitations, syncope, peripheral edema, and claudication. Resp: Denies dyspnea at rest, cough, wheezing, coughing up blood, and pleurisy. GI: See HPI Derm: Denies rash, itching, dry skin Psych: Denies depression, anxiety, memory loss, confusion. No homicidal or suicidal ideation.  Heme: Denies bruising, bleeding, and enlarged lymph nodes.   Physical Exam   BP 127/76   Pulse 86   Temp 97.7 F (36.5 C)   Ht 5\' 5"  (1.651 m)   Wt 127 lb 9.6 oz (57.9 kg)   BMI 21.23 kg/m   General:   Alert and oriented. No distress noted. Pleasant and cooperative.  Head:  Normocephalic and atraumatic. Eyes:  Conjuctiva clear without scleral icterus. Mouth:  Oral mucosa pink and moist. Good dentition. No lesions. Abdomen:  +BS, soft, non-tender and non-distended.  No rebound or guarding. No HSM or masses noted. Rectal: deferred Msk:  Symmetrical without gross deformities. Normal posture. Extremities:  Without edema. Neurologic:  Alert and  oriented x4 Psych:  Alert and cooperative. Normal mood and affect.   Assessment  [name] [name] is a 72 y.o. female with a history of chronic GERD presenting today with complaint of abnormal colored stools.   Pancreatic insufficiency, abnormal stool color, diarrhea: Continues  to have concerns regarding her yellow/light brown stools as she does not feels that this is normal.  Symptoms have been present since stopping her iron in 2022.  Her bowel function has slightly improved and she is not having any significant watery stools at this time but usually has some looser/mushy stools about 1-2/day that is not concerning to her.  Her workup has consisted of evaluation of LFTs with a mildly elevated bilirubin at 1.3 with indirect of 1 and direct of 0.3.  GGT and liver ultrasound normal.  CT also without any acute abnormalities of the hepatobiliary system or pancreas.  Fecal elastase was mildly low at 181 and she had no change in stool color or much change in consistency with low-dose or higher dose of Zenpep.  At this time does she does not appear to be exhibiting any concerning alarm symptoms to suggest any significant hepatobiliary or pancreatic etiology and has no signs of malabsorption.  All of her questions were answered today in detail and discussed that if symptoms were to worsen that we could consider more extensive fecal fat testing.  We did discuss the importance of adhering to a low-fat diet.  Doubtful that trial of Xifaxan or cholestyramine for her stool consistency and color would be of much benefit and may inadvertently cause constipation.  We did discuss that doing a picture of her stools and more adequately being able to assess the color would be beneficial but she is unsure if she would be able to do this.  GERD,  epigastric pain: Fairly well-controlled with pantoprazole 40 mg twice daily as well as Tums for breakthrough and avoidance of dietary triggers.  Advise she may continue Carafate as needed for severe refractory symptoms.   PLAN   Could consider quantitative vs qualitative fecal fat testing in the future if things worsen.  Continue to hold pancreatic enzymes Low fat diet Advised to take a picture of the stool and send to the office if able Continue pantoprazole 40 mg twice daily and Tums as needed for breakthrough May continue to use Carafate as needed for severe symptoms. Follow up in 6 months   Brooke Bonito, MSN, FNP-BC, AGACNP-BC [facility]

## 2023-03-11 ENCOUNTER — Ambulatory Visit (INDEPENDENT_AMBULATORY_CARE_PROVIDER_SITE_OTHER): Payer: PPO | Admitting: Gastroenterology

## 2023-03-11 ENCOUNTER — Encounter: Payer: Self-pay | Admitting: Gastroenterology

## 2023-03-11 VITALS — BP 127/76 | HR 86 | Temp 97.7°F | Ht 65.0 in | Wt 127.6 lb

## 2023-03-11 DIAGNOSIS — R1013 Epigastric pain: Secondary | ICD-10-CM

## 2023-03-11 DIAGNOSIS — R195 Other fecal abnormalities: Secondary | ICD-10-CM | POA: Diagnosis not present

## 2023-03-11 DIAGNOSIS — K219 Gastro-esophageal reflux disease without esophagitis: Secondary | ICD-10-CM | POA: Diagnosis not present

## 2023-03-11 DIAGNOSIS — K8689 Other specified diseases of pancreas: Secondary | ICD-10-CM

## 2023-03-11 DIAGNOSIS — G8929 Other chronic pain: Secondary | ICD-10-CM

## 2023-03-11 NOTE — Patient Instructions (Addendum)
Continue to hold the pancreatic enzymes.  Try a low fat diet.  This is essentially the same thing as the gallbladder eating plan which I have attached for you.  As we discussed, if able, taking a picture of your stool and bring to the office to view it would be helpful in the future.  We will plan to follow-up in 6 months, sooner if needed.   It was a pleasure to see you today. I want to create trusting relationships with patients. If you receive a survey regarding your visit,  I greatly appreciate you taking time to fill this out on paper or through your MyChart. I value your feedback.  Brooke Bonito, MSN, FNP-BC, AGACNP-BC [facility]

## 2023-06-10 ENCOUNTER — Encounter (HOSPITAL_COMMUNITY): Payer: Self-pay

## 2023-06-10 ENCOUNTER — Encounter: Payer: Self-pay | Admitting: Internal Medicine

## 2023-06-10 ENCOUNTER — Ambulatory Visit (INDEPENDENT_AMBULATORY_CARE_PROVIDER_SITE_OTHER): Payer: PPO | Admitting: Internal Medicine

## 2023-06-10 ENCOUNTER — Other Ambulatory Visit (HOSPITAL_COMMUNITY): Payer: Self-pay | Admitting: Obstetrics and Gynecology

## 2023-06-10 ENCOUNTER — Ambulatory Visit (HOSPITAL_COMMUNITY)
Admission: RE | Admit: 2023-06-10 | Discharge: 2023-06-10 | Disposition: A | Discharge status: Home / Self Care | Payer: PPO | Source: Ambulatory Visit | Attending: Obstetrics and Gynecology | Admitting: Obstetrics and Gynecology

## 2023-06-10 VITALS — BP 137/88 | HR 98 | Ht 63.0 in | Wt 128.8 lb

## 2023-06-10 DIAGNOSIS — K8689 Other specified diseases of pancreas: Secondary | ICD-10-CM | POA: Insufficient documentation

## 2023-06-10 DIAGNOSIS — K219 Gastro-esophageal reflux disease without esophagitis: Secondary | ICD-10-CM | POA: Diagnosis not present

## 2023-06-10 DIAGNOSIS — F419 Anxiety disorder, unspecified: Secondary | ICD-10-CM

## 2023-06-10 DIAGNOSIS — G4700 Insomnia, unspecified: Secondary | ICD-10-CM | POA: Insufficient documentation

## 2023-06-10 DIAGNOSIS — E785 Hyperlipidemia, unspecified: Secondary | ICD-10-CM | POA: Insufficient documentation

## 2023-06-10 DIAGNOSIS — E782 Mixed hyperlipidemia: Secondary | ICD-10-CM

## 2023-06-10 DIAGNOSIS — Z1231 Encounter for screening mammogram for malignant neoplasm of breast: Secondary | ICD-10-CM | POA: Insufficient documentation

## 2023-06-10 DIAGNOSIS — I10 Essential (primary) hypertension: Secondary | ICD-10-CM | POA: Diagnosis not present

## 2023-06-10 DIAGNOSIS — F5104 Psychophysiologic insomnia: Secondary | ICD-10-CM

## 2023-06-10 NOTE — Assessment & Plan Note (Signed)
Followed by gastroenterology (Dr. Marletta Lor).  Last seen for follow-up in July.  She has previously noted a change in stool color as they became yellow after discontinuing oral iron supplementation.  She has undergone an extensive workup that has revealed a mildly reduced fecal elastase level but otherwise normal results.  She was prescribed  Zenpep but reports that she is no longer taking it.

## 2023-06-10 NOTE — Assessment & Plan Note (Signed)
She is currently prescribed enalapril 10 mg every morning.  BP today is 137/88.  No medication changes are indicated at this time.

## 2023-06-10 NOTE — Patient Instructions (Signed)
It was a pleasure to see you today.  Thank you for giving [place] the opportunity to be involved in your care.  Below is a brief recap of your visit and next steps.  We will plan to see you again in 6 months.  Summary You have established care today No medication changes have been made or repeat labs ordered Controlled substance agreement and UDS pending Follow up in 6 months for annual exam

## 2023-06-10 NOTE — Progress Notes (Signed)
New Patient Office Visit  Subjective    Patient ID: [name] [name], female    DOB: 04-08-1951  Age: 72 y.o. MRN: [id number]  CC:  Chief Complaint  Patient presents with   New Patient (Initial Visit)   HPI [name] [name] presents to establish care.  She is a 72 year old woman who endorses a past medical history significant for breast cancer (2006) s/p lumpectomy and chemo/radiation, HTN, HLD, GERD, and pancreatic insufficiency.  Previously followed by Dr. Sudie Bailey.  [name] [name] reports feeling fairly well today.  She has multiple concerns to discuss.  She endorses a history of insomnia for which she is currently prescribed lorazepam 0.5 mg nightly.  She states that she continues to experience difficulty both falling and staying asleep.  She has recently been prescribed citalopram, but has not started taking it as she is concerned for the indication and potential adverse side effects.  She also does not want to start any medicines with addictive potential.  She requests medication recommendations today.  She would additionally like my opinion on her previous hepatobiliary and pancreatic workup that has not revealed any concerning findings aside from pancreatic insufficiency.  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  She denies tobacco, alcohol, and illicit drug use.  Her family medical history is significant for lung cancer, DM, CVA, HTN, and CAD.  Acute concerns, chronic medical conditions, and outstanding preventative care items discussed today are individually addressed nasal spray below.  Outpatient Encounter Medications as of 06/10/2023  Medication Sig   acetaminophen (TYLENOL) 325 MG tablet Take 325-650 mg by mouth daily as needed (pain).   atorvastatin (LIPITOR) 20 MG tablet Take 20 mg by mouth at bedtime.    Calcium Carb-Cholecalciferol (CALCIUM + D3 PO) Take 1 tablet by mouth in the morning.   calcium elemental as carbonate  (BARIATRIC TUMS ULTRA) 400 MG chewable tablet Chew 1 tablet by mouth 3 (three) times daily as needed for indigestion or heartburn.   clobetasol cream (TEMOVATE) 0.05 % Apply 1 application topically daily as needed (irritation).   enalapril (VASOTEC) 10 MG tablet Take 10 mg by mouth in the morning.   hydrocortisone cream 1 % Apply 1 application. topically 2 (two) times daily as needed (skin irritation/itching).   LORazepam (ATIVAN) 0.5 MG tablet Take 0.5 mg by mouth at bedtime.   oxymetazoline (AFRIN) 0.05 % nasal spray Place 1 spray into both nostrils at bedtime as needed for congestion.   pantoprazole (PROTONIX) 40 MG tablet TAKE 1 TABLET(40 MG) BY MOUTH TWICE DAILY BEFORE A MEAL   valACYclovir (VALTREX) 500 MG tablet Take 500 mg by mouth as needed (for outbreaks).    vitamin B-12 (CYANOCOBALAMIN) 1000 MCG tablet Take 1,000 mcg by mouth 3 (three) times a week. In the morning   Vitamins A & D (VITAMIN A & D) ointment Apply 1 application  topically as needed (irritation).   alum & mag hydroxide-simeth (MAALOX/MYLANTA) 200-200-20 MG/5ML suspension Take 15-30 mLs by mouth daily as needed for indigestion or heartburn. (Patient not taking: Reported on 06/10/2023)   Pancrelipase, Lip-Prot-Amyl, (ZENPEP) 40000-126000 units CPEP Take 1 capsule (40,000 Units total) by mouth 3 (three) times daily with meals. 1 capsule with snacks (Patient not taking: Reported on 01/10/2023)   sucralfate (CARAFATE) 1 g tablet Take 1 tablet (1 g total) by mouth 4 (four) times daily -  with meals and at bedtime. (Patient not taking: Reported on 06/10/2023)   No  facility-administered encounter medications on file as of 06/10/2023.    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   Functional dyspepsia 11/2012   APR 2014: PT DECLINED BRAVO. PPIs-PROTOIX/PRILOSEC-->DEXILANT-->NEXIUM(TOO EXPENSIVE)   GERD (gastroesophageal reflux disease)    High  cholesterol    Hypertension    Personal history of chemotherapy 2006   Personal history of radiation therapy 2006    Past Surgical History:  Procedure Laterality Date   BALLOON DILATION N/A 04/28/2020   Procedure: BALLOON DILATION;  Surgeon: Lanelle Bal, DO;  [facility];  Service: Endoscopy;  Laterality: N/A;   BIOPSY  04/28/2020   Procedure: BIOPSY;  Surgeon: Lanelle Bal, DO;  [facility];  Service: Endoscopy;;   BREAST LUMPECTOMY  2006   Left  breast   BREAST LUMPECTOMY  1994   Right breast-benign   CHOLECYSTECTOMY N/A 05/10/2016   Procedure: LAPAROSCOPIC CHOLECYSTECTOMY WITH INTRAOPERATIVE CHOLANGIOGRAM;  Surgeon: Franky Macho, MD;  Location: [facility];  Service: General;  Laterality: N/A;   COLONOSCOPY  2009   normal colon without evidence of polyps   COLONOSCOPY WITH PROPOFOL N/A 01/19/2022   Procedure: COLONOSCOPY WITH PROPOFOL;  Surgeon: Lanelle Bal, DO;  [facility]: [facility];  Service: Endoscopy;  Laterality: N/A;  7:30am   ESOPHAGOGASTRODUODENOSCOPY  2009   normal stomach,esophagues without mass,reosion   ESOPHAGOGASTRODUODENOSCOPY  09/17/2011   SLF: Mild gastritis/NO SOURCE FOR IRON DEFICINECY ANEMIA IDENTIFIED   ESOPHAGOGASTRODUODENOSCOPY N/A 12/02/2012   SLF: 1. No source for dyspepsia identified 2. MILD Non-erosive gastritis   ESOPHAGOGASTRODUODENOSCOPY (EGD) WITH PROPOFOL N/A 04/28/2020   Procedure: ESOPHAGOGASTRODUODENOSCOPY (EGD) WITH PROPOFOL;  Surgeon: Lanelle Bal, DO;  [facility];  Service: Endoscopy;  Laterality: N/A;  7:30am   GIVENS CAPSULE STUDY  09/26/2011   Procedure: GIVENS CAPSULE STUDY;  Surgeon: Arlyce Harman, MD;  [facility]: [facility];  Service: Endoscopy;  Laterality: N/A;   ILEOColonoscopy  09/17/2011   SLF: Nl colonoscopy WMO/INTERNA; HEMORRHOIDS/ NO SOURCE FOR IRON DEFICIENCY ANEMA IDENTIFIED   LAPAROSCOPIC APPENDECTOMY N/A 06/25/2018   Procedure: APPENDECTOMY LAPAROSCOPIC;  Surgeon: Franky Macho, MD;  Location: [facility];  Service: General;  Laterality: N/A;   POLYPECTOMY  01/19/2022   Procedure: POLYPECTOMY;  Surgeon: Lanelle Bal, DO;  [facility];  Service: Endoscopy;;    Family History  Problem Relation Age of Onset   Alzheimer's disease Mother    Hypertension Mother    Liver cancer Father    Diabetes Sister    Stroke Sister    Heart disease Sister    Heart disease Brother    Diabetes Brother    Colon cancer Maternal Aunt    Cancer - Colon Cousin    Anesthesia problems Neg Hx    Hypotension Neg Hx    Malignant hyperthermia Neg Hx    Pseudochol deficiency Neg Hx    Colon polyps Neg Hx     Social History   Socioeconomic History   Marital status: Married    Spouse name: Not on file   Number of children: Not on file   Years of education: Not on file   Highest education level: Not on file  Occupational History   Not on file  Tobacco Use   Smoking status: Never   Smokeless tobacco: Never  Vaping Use   Vaping status: Never Used  Substance and Sexual Activity   Alcohol use: No  Drug use: No   Sexual activity: Not Currently  Other Topics Concern   Not on file  Social History Narrative   Not on file   Social Determinants of Health   Financial Resource Strain: Not on file  Food Insecurity: Not on file  Transportation Needs: Not on file  Physical Activity: Not on file  Stress: Not on file  Social Connections: Not on file  Intimate Partner Violence: Not on file   Review of Systems  Constitutional:  Negative for chills and fever.  HENT:  Negative for sore throat.   Respiratory:  Negative for cough and shortness of breath.   Cardiovascular:  Negative for chest pain, palpitations and leg swelling.  Gastrointestinal:  Negative for abdominal pain, blood in stool, constipation, diarrhea, nausea and vomiting.  Genitourinary:  Negative for dysuria and hematuria.  Musculoskeletal:  Negative for myalgias.  Skin:  Negative for itching and  rash.  Neurological:  Negative for dizziness and headaches.  Psychiatric/Behavioral:  Negative for depression and suicidal ideas. The patient has insomnia.    Objective    BP 137/88 (BP Location: Right Arm, Patient Position: Sitting, Cuff Size: Normal)   Pulse 98   Ht 5\' 3"  (1.6 m)   Wt 128 lb 12.8 oz (58.4 kg)   SpO2 98%   BMI 22.82 kg/m   Physical Exam Vitals reviewed.  Constitutional:      General: She is not in acute distress.    Appearance: Normal appearance. She is not toxic-appearing.  HENT:     Head: Normocephalic and atraumatic.     Right Ear: External ear normal.     Left Ear: External ear normal.     Nose: Nose normal. No congestion or rhinorrhea.     Mouth/Throat:     Mouth: Mucous membranes are moist.     Pharynx: Oropharynx is clear. No oropharyngeal exudate or posterior oropharyngeal erythema.  Eyes:     General: No scleral icterus.    Extraocular Movements: Extraocular movements intact.     Conjunctiva/sclera: Conjunctivae normal.     Pupils: Pupils are equal, round, and reactive to light.  Cardiovascular:     Rate and Rhythm: Normal rate and regular rhythm.     Pulses: Normal pulses.     Heart sounds: Normal heart sounds. No murmur heard.    No friction rub. No gallop.  Pulmonary:     Effort: Pulmonary effort is normal.     Breath sounds: Normal breath sounds. No wheezing, rhonchi or rales.  Abdominal:     General: Abdomen is flat. Bowel sounds are normal. There is no distension.     Palpations: Abdomen is soft.     Tenderness: There is no abdominal tenderness.  Musculoskeletal:        General: No swelling. Normal range of motion.     Cervical back: Normal range of motion.     Right lower leg: No edema.     Left lower leg: No edema.  Lymphadenopathy:     Cervical: No cervical adenopathy.  Skin:    General: Skin is warm and dry.     Capillary Refill: Capillary refill takes less than 2 seconds.     Coloration: Skin is not jaundiced.  Neurological:      General: No focal deficit present.     Mental Status: She is alert and oriented to person, place, and time.  Psychiatric:        Mood and Affect: Mood normal.  Behavior: Behavior normal.    Assessment & Plan:   Problem List Items Addressed This Visit       Hypertension - Primary    She is currently prescribed enalapril 10 mg every morning.  BP today is 137/88.  No medication changes are indicated at this time.      GERD    Symptoms are adequately controlled with Protonix 40 mg twice daily.  She is also prescribed Carafate for as needed use but reports seldomly taking it.      Pancreatic insufficiency    Followed by gastroenterology (Dr. Marletta Lor).  Last seen for follow-up in July.  She has previously noted a change in stool color as they became yellow after discontinuing oral iron supplementation.  She has undergone an extensive workup that has revealed a mildly reduced fecal elastase level but otherwise normal results.  She was prescribed  Zenpep but reports that she is no longer taking it.      Hyperlipidemia    She is currently prescribed atorvastatin 20 mg daily.  Lipid panel updated in April and reflects adequate control.  No medication changes are indicated today.      Insomnia    Currently prescribed lorazepam 0.5 mg nightly for management of insomnia.  PDMP reviewed and is appropriate.  Her acute concern today remains insomnia.  She is interested in additional medication recommendations.  Her previous PCP prescribed citalopram but she is not interested in starting it. -Sleep hygiene measures reviewed.  I recommended nightly use of melatonin. -Controlled substance agreement signed.  UDS pending.      Return in about 6 months (around 12/09/2023) for CPE.   Billie Lade, MD

## 2023-06-10 NOTE — Assessment & Plan Note (Signed)
Symptoms are adequately controlled with Protonix 40 mg twice daily.  She is also prescribed Carafate for as needed use but reports seldomly taking it.

## 2023-06-10 NOTE — Assessment & Plan Note (Signed)
Currently prescribed lorazepam 0.5 mg nightly for management of insomnia.  PDMP reviewed and is appropriate.  Her acute concern today remains insomnia.  She is interested in additional medication recommendations.  Her previous PCP prescribed citalopram but she is not interested in starting it. -Sleep hygiene measures reviewed.  I recommended nightly use of melatonin. -Controlled substance agreement signed.  UDS pending.

## 2023-06-10 NOTE — Assessment & Plan Note (Signed)
She is currently prescribed atorvastatin 20 mg daily.  Lipid panel updated in April and reflects adequate control.  No medication changes are indicated today.

## 2023-06-14 LAB — TOXASSURE SELECT 13 (MW), URINE

## 2023-08-15 ENCOUNTER — Encounter: Payer: Self-pay | Admitting: Gastroenterology

## 2023-09-03 ENCOUNTER — Other Ambulatory Visit: Payer: Self-pay | Admitting: Internal Medicine

## 2023-09-03 MED ORDER — ATORVASTATIN CALCIUM 20 MG PO TABS: 20.0000 mg | ORAL_TABLET | Freq: Every day | ORAL | 0 refills | Status: DC

## 2023-09-03 MED ORDER — ENALAPRIL MALEATE 10 MG PO TABS: 10.0000 mg | ORAL_TABLET | Freq: Every morning | ORAL | 0 refills | Status: DC

## 2023-09-03 NOTE — Telephone Encounter (Signed)
 Copied from CRM [id number]. Topic: Clinical - Medication Refill >> Sep 03, 2023  3:26 PM Deleta HERO wrote: Most Recent Primary Care Visit:  Provider: DIXON, PHILLIP E  Department: [facility]  Visit Type: NEW PATIENT  Date: 06/10/2023  Medication: atorvastatin  (LIPITOR) 20 MG tablet and enalapril  (VASOTEC ) 10 MG tablet  Has the patient contacted their pharmacy? Yes, the patient will be transferring to another pharmacy and needs refill sent to them instead. (Agent: If no, request that the patient contact the pharmacy for the refill. If patient does not wish to contact the pharmacy document the reason why and proceed with request.) (Agent: If yes, when and what did the pharmacy advise?)  Is this the correct pharmacy for this prescription? Yes If no, delete pharmacy and type the correct one.  This is the patient's preferred pharmacy:  [facility] [address], [place], [place] [address] Phone #:[phone] Fax #: [phone]  Has the prescription been filled recently? No  Is the patient out of the medication? Yes  Has the patient been seen for an appointment in the last year OR does the patient have an upcoming appointment? Yes  Can we respond through MyChart? No  Agent: Please be advised that Rx refills may take up to 3 business days. We ask that you follow-up with your pharmacy.

## 2023-09-18 ENCOUNTER — Ambulatory Visit (INDEPENDENT_AMBULATORY_CARE_PROVIDER_SITE_OTHER): Payer: PPO

## 2023-09-18 VITALS — Ht 65.0 in | Wt 128.0 lb

## 2023-09-18 DIAGNOSIS — Z0000 Encounter for general adult medical examination without abnormal findings: Secondary | ICD-10-CM | POA: Diagnosis not present

## 2023-09-18 DIAGNOSIS — Z780 Asymptomatic menopausal state: Secondary | ICD-10-CM | POA: Diagnosis not present

## 2023-09-18 DIAGNOSIS — Z1159 Encounter for screening for other viral diseases: Secondary | ICD-10-CM

## 2023-09-18 NOTE — Patient Instructions (Signed)
 [name] [name] , Thank you for taking time to come for your Medicare Wellness Visit. I appreciate your ongoing commitment to your health goals. Please review the following plan we discussed and let me know if I can assist you in the future.   Referrals/Orders/Follow-Ups/Clinician Recommendations:  Next Medicare Annual Wellness Visit:  September 22, 2024 at 8:00 am virtual visit.   REFERRALS PLACED FOR YOU DURING THE VISIT TODAY: You have an order for:  []   2D Mammogram  []   3D Mammogram  [x]   Bone Density   []   Lung Cancer Screening  Please call for appointment:   [facility] at [facility] [address] [place], [place] [address] [phone]  Make sure to wear two-piece clothing.  No lotions powders or deodorants the day of the appointment Make sure to bring picture ID and insurance card.  Bring list of medications you are currently taking including any supplements.   A Hepatitis C Screening has been ordered for you today. You do not have to fast to have this lab drawn. Have this lab drawn at the same lab you use for yearly labs.   You are due for the vaccines checked below. You may have these done at your preferred pharmacy. Please have them fax the office proof of the vaccines so that we can update your chart.   []  Flu (due annually)  Recommended this fall either at PCP office or through your local pharmacy. The flu season starts August 1 of each year.   [x]  Shingrix (Shingles vaccine): CDC recommends 2 doses of Shingrix separated by 2-6 months for aged 58 years and older:  [x]  Pneumonia Vaccines: Recommended for adults 65 years or older  []  TDAP (Tetanus) Vaccine every 10 years:Recommended every 10 years; Please call your insurance company to determine your out of pocket expense. You also receive this vaccine at your local pharmacy or Health Dept.  [x]  Covid-19: Available now at any [facility] pharmacy (see info below)  You may also get your vaccines at  any [facility] (locations listed below.) Vaccine hours are Monday - Friday 9:00 - 4:00. No appointments are required. Most insurances are accepted including Medicaid. Anyone can use the community pharmacies, and people are not required to have a [facility] provider.  Community Pharmacy Locations offering vaccines:   Sport and exercise psychologist   [facility]  10 vaccines are offered at the [employer]: Covid, flu, Tdap, shingles, RSV, pneumonia, meningococcal, hepatitis A, hepatitis B, and HPV.    This is a list of the screening recommended for you and due dates:  Health Maintenance  Topic Date Due   Hepatitis C Screening  Never done   Zoster (Shingles) Vaccine (1 of 2) Never done   Pneumonia Vaccine (1 of 1 - PCV) Never done   DEXA scan (bone density measurement)  08/25/2020   COVID-19 Vaccine (1 - 2024-25 season) Never done   Mammogram  06/09/2024   Medicare Annual Wellness Visit  09/17/2024   Colon Cancer Screening  01/20/2032   DTaP/Tdap/Td vaccine (2 - Td or Tdap) 07/14/2033   Flu Shot  Completed   HPV Vaccine  Aged Out    Advanced directives: (ACP Link)Information on Advanced Care Planning can be found at [place]  Best boy Advance Health Care Directives Advance Health Care Directives ([email or web address]/)   Next Medicare Annual Wellness Visit scheduled for  next year: yes  Preventive Care 75 Years and Older, Female Preventive care refers to lifestyle choices and visits with your health care provider that can promote health and wellness. Preventive care visits are also called wellness exams. What can I expect for my preventive care visit? Counseling Your health care provider may ask you questions about your: Medical history, including: Past medical problems. Family medical history. Pregnancy and menstrual history. History of falls. Current health,  including: Memory and ability to understand (cognition). Emotional well-being. Home life and relationship well-being. Sexual activity and sexual health. Lifestyle, including: Alcohol, nicotine or tobacco, and drug use. Access to firearms. Diet, exercise, and sleep habits. Work and work Astronomer. Sunscreen use. Safety issues such as seatbelt and bike helmet use. Physical exam Your health care provider will check your: Height and weight. These may be used to calculate your BMI (body mass index). BMI is a measurement that tells if you are at a healthy weight. Waist circumference. This measures the distance around your waistline. This measurement also tells if you are at a healthy weight and may help predict your risk of certain diseases, such as type 2 diabetes and high blood pressure. Heart rate and blood pressure. Body temperature. Skin for abnormal spots. What immunizations do I need?  Vaccines are usually given at various ages, according to a schedule. Your health care provider will recommend vaccines for you based on your age, medical history, and lifestyle or other factors, such as travel or where you work. What tests do I need? Screening Your health care provider may recommend screening tests for certain conditions. This may include: Lipid and cholesterol levels. Hepatitis C test. Hepatitis B test. HIV (human immunodeficiency virus) test. STI (sexually transmitted infection) testing, if you are at risk. Lung cancer screening. Colorectal cancer screening. Diabetes screening. This is done by checking your blood sugar (glucose) after you have not eaten for a while (fasting). Mammogram. Talk with your health care provider about how often you should have regular mammograms. BRCA-related cancer screening. This may be done if you have a family history of breast, ovarian, tubal, or peritoneal cancers. Bone density scan. This is done to screen for osteoporosis. Talk with your health  care provider about your test results, treatment options, and if necessary, the need for more tests. Follow these instructions at home: Eating and drinking  Eat a diet that includes fresh fruits and vegetables, whole grains, lean protein, and low-fat dairy products. Limit your intake of foods with high amounts of sugar, saturated fats, and salt. Take vitamin and mineral supplements as recommended by your health care provider. Do not drink alcohol if your health care provider tells you not to drink. If you drink alcohol: Limit how much you have to 0-1 drink a day. Know how much alcohol is in your drink. In the [place] one drink equals one 12 oz bottle of beer (355 mL), one 5 oz glass of wine (148 mL), or one 1 oz glass of hard liquor (44 mL). Lifestyle Brush your teeth every morning and night with fluoride toothpaste. Floss one time each day. Exercise for at least 30 minutes 5 or more days each week. Do not use any products that contain nicotine or tobacco. These products include cigarettes, chewing tobacco, and vaping devices, such as e-cigarettes. If you need help quitting, ask your health care provider. Do not use drugs. If you are sexually active, practice safe sex. Use a condom or other form of protection in order to prevent STIs. Take  aspirin only as told by your health care provider. Make sure that you understand how much to take and what form to take. Work with your health care provider to find out whether it is safe and beneficial for you to take aspirin daily. Ask your health care provider if you need to take a cholesterol-lowering medicine (statin). Find healthy ways to manage stress, such as: Meditation, yoga, or listening to music. Journaling. Talking to a trusted person. Spending time with friends and family. Minimize exposure to UV radiation to reduce your risk of skin cancer. Safety Always wear your seat belt while driving or riding in a vehicle. Do not drive: If you have  been drinking alcohol. Do not ride with someone who has been drinking. When you are tired or distracted. While texting. If you have been using any mind-altering substances or drugs. Wear a helmet and other protective equipment during sports activities. If you have firearms in your house, make sure you follow all gun safety procedures. What's next? Visit your health care provider once a year for an annual wellness visit. Ask your health care provider how often you should have your eyes and teeth checked. Stay up to date on all vaccines. This information is not intended to replace advice given to you by your health care provider. Make sure you discuss any questions you have with your health care provider. Document Revised: 02/15/2021 Document Reviewed: 02/15/2021 Elsevier Patient Education  2024 [employer].   Understanding Your Risk for Falls Millions of people have serious injuries from falls each year. It is important to understand your risk of falling. Talk with your health care provider about your risk and what you can do to lower it. If you do have a serious fall, make sure to tell your provider. Falling once raises your risk of falling again. How can falls affect me? Serious injuries from falls are common. These include: Broken bones, such as hip fractures. Head injuries, such as traumatic brain injuries (TBI) or concussions. A fear of falling can cause you to avoid activities and stay at home. This can make your muscles weaker and raise your risk for a fall. What can increase my risk? There are a number of risk factors that increase your risk for falling. The more risk factors you have, the higher your risk of falling. Serious injuries from a fall happen most often to people who are older than 73 years old. Teenagers and young adults ages 85-29 are also at higher risk. Common risk factors include: Weakness in the lower body. Being generally weak or confused due to long-term  (chronic) illness. Dizziness or balance problems. Poor vision. Medicines that cause dizziness or drowsiness. These may include: Medicines for your blood pressure, heart, anxiety, insomnia, or swelling (edema). Pain medicines. Muscle relaxants. Other risk factors include: Drinking alcohol. Having had a fall in the past. Having foot pain or wearing improper footwear. Working at a dangerous job. Having any of the following in your home: Tripping hazards, such as floor clutter or loose rugs. Poor lighting. Pets. Having dementia or memory loss. What actions can I take to lower my risk of falling?     Physical activity Stay physically fit. Do strength and balance exercises. Consider taking a regular class to build strength and balance. Yoga and tai chi are good options. Vision Have your eyes checked every year and your prescription for glasses or contacts updated as needed. Shoes and walking aids Wear non-skid shoes. Wear shoes that have rubber soles  and low heels. Do not wear high heels. Do not walk around the house in socks or slippers. Use a cane or walker as told by your provider. Home safety Attach secure railings on both sides of your stairs. Install grab bars for your bathtub, shower, and toilet. Use a non-skid mat in your bathtub or shower. Attach bath mats securely with double-sided, non-slip rug tape. Use good lighting in all rooms. Keep a flashlight near your bed. Make sure there is a clear path from your bed to the bathroom. Use night-lights. Do not use throw rugs. Make sure all carpeting is taped or tacked down securely. Remove all clutter from walkways and stairways, including extension cords. Repair uneven or broken steps and floors. Avoid walking on icy or slippery surfaces. Walk on the grass instead of on icy or slick sidewalks. Use ice melter to get rid of ice on walkways in the winter. Use a cordless phone. Questions to ask your health care provider Can you help  me check my risk for a fall? Do any of my medicines make me more likely to fall? Should I take a vitamin D  supplement? What exercises can I do to improve my strength and balance? Should I make an appointment to have my vision checked? Do I need a bone density test to check for weak bones (osteoporosis)? Would it help to use a cane or a walker? Where to find more information Centers for Disease Control and Prevention, [employer]AD[employer]: TonerPromos.no Community-Based Fall Prevention Programs: TonerPromos.no [employer] on Aging: BaseRingTones.pl Contact a health care provider if: You fall at home. You are afraid of falling at home. You feel weak, drowsy, or dizzy. This information is not intended to replace advice given to you by your health care provider. Make sure you discuss any questions you have with your health care provider. Document Revised: 04/23/2022 Document Reviewed: 04/23/2022 Elsevier Patient Education  2024 [employer].

## 2023-09-18 NOTE — Progress Notes (Signed)
 Because this visit was a virtual/telehealth visit,  certain criteria was not obtained, such a blood pressure, CBG if applicable, and timed get up and go. Any medications not marked as "taking" were not mentioned during the medication reconciliation part of the visit. Any vitals not documented were not able to be obtained due to this being a telehealth visit or patient was unable to self-report a recent blood pressure reading due to a lack of equipment at home via telehealth. Vitals that have been documented are verbally provided by the patient.  Interactive audio and video telecommunications were attempted between this provider and patient, however failed, due to patient having technical difficulties OR patient did not have access to video capability.  We continued and completed visit with audio only.  Subjective:   [name] [name] is a 73 y.o. female who presents for an Initial Medicare Annual Wellness Visit.  Visit Complete: Virtual I connected with  Kiela C Laatsch on 09/18/23 by a audio enabled telemedicine application and verified that I am speaking with the correct person using two identifiers.  Patient Location: Home  Provider Location: Office/Clinic  I discussed the limitations of evaluation and management by telemedicine. The patient expressed understanding and agreed to proceed.  Vital Signs: Because this visit was a virtual/telehealth visit, some criteria may be missing or patient reported. Any vitals not documented were not able to be obtained and vitals that have been documented are patient reported.  Patient Medicare AWV questionnaire was completed by the patient on na; I have confirmed that all information answered by patient is correct and no changes since this date.  Cardiac Risk Factors include: advanced age (>72men, >47 women);dyslipidemia;hypertension     Objective:    Today's Vitals   09/18/23 1308  Weight: 128 lb (58.1 kg)  Height: 5\' 5"  (1.651 m)   Body mass index is  21.3 kg/m.     09/18/2023    1:07 PM 01/19/2022    6:35 AM 04/28/2020    6:34 AM 10/28/2018    8:08 AM 06/25/2018    5:26 PM 10/28/2017   10:23 AM 12/06/2016   11:12 AM  Advanced Directives  Does Patient Have a Medical Advance Directive? No Yes Yes Yes No Yes Yes  Type of Science writer of [place];Living will Living will  Healthcare Power of [place];Living will Healthcare Power of Attorney  Does patient want to make changes to medical advance directive?    No - Patient declined  No - Patient declined No - Patient declined  Copy of Healthcare Power of Attorney in Chart?      No - copy requested No - copy requested  Would patient like information on creating a medical advance directive? No - Patient declined    No - Patient declined No - Patient declined     Current Medications (verified) Outpatient Encounter Medications as of 09/18/2023  Medication Sig   acetaminophen  (TYLENOL ) 325 MG tablet Take 325-650 mg by mouth daily as needed (pain).   alum & mag hydroxide-simeth (MAALOX/MYLANTA) 200-200-20 MG/5ML suspension Take 15-30 mLs by mouth daily as needed for indigestion or heartburn.   atorvastatin  (LIPITOR) 20 MG tablet Take 1 tablet (20 mg total) by mouth at bedtime.   Calcium  Carb-Cholecalciferol  (CALCIUM  + D3 PO) Take 1 tablet by mouth in the morning.   calcium  elemental as carbonate (BARIATRIC TUMS ULTRA) 400 MG chewable tablet Chew 1 tablet by mouth 3 (three) times daily as needed for indigestion or  heartburn.   clobetasol cream (TEMOVATE) 0.05 % Apply 1 application topically daily as needed (irritation).   enalapril  (VASOTEC ) 10 MG tablet Take 1 tablet (10 mg total) by mouth in the morning.   hydrocortisone cream 1 % Apply 1 application. topically 2 (two) times daily as needed (skin irritation/itching).   LORazepam  (ATIVAN ) 0.5 MG tablet Take 0.5 mg by mouth at bedtime.   oxymetazoline (AFRIN) 0.05 % nasal spray Place 1 spray into both  nostrils at bedtime as needed for congestion.   Pancrelipase , Lip-Prot-Amyl, (ZENPEP ) 40000-126000 units CPEP Take 1 capsule (40,000 Units total) by mouth 3 (three) times daily with meals. 1 capsule with snacks   pantoprazole  (PROTONIX ) 40 MG tablet TAKE 1 TABLET(40 MG) BY MOUTH TWICE DAILY BEFORE A MEAL   sucralfate  (CARAFATE ) 1 g tablet Take 1 tablet (1 g total) by mouth 4 (four) times daily -  with meals and at bedtime.   valACYclovir (VALTREX) 500 MG tablet Take 500 mg by mouth as needed (for outbreaks).    vitamin B-12 (CYANOCOBALAMIN ) 1000 MCG tablet Take 1,000 mcg by mouth 3 (three) times a week. In the morning   Vitamins A & D (VITAMIN A & D) ointment Apply 1 application  topically as needed (irritation).   No facility-administered encounter medications on file as of 09/18/2023.    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   Functional dyspepsia 11/2012   APR 2014: PT DECLINED BRAVO. PPIs-PROTOIX/PRILOSEC-->DEXILANT -->NEXIUM (TOO EXPENSIVE)   GERD (gastroesophageal reflux disease)    High cholesterol    Hypertension    Personal history of chemotherapy 2006   Personal history of radiation therapy 2006   Past Surgical History:  Procedure Laterality Date   BALLOON DILATION N/A 04/28/2020   Procedure: BALLOON DILATION;  Surgeon: Vinetta Greening, DO;  [facility];  Service: Endoscopy;  Laterality: N/A;   BIOPSY  04/28/2020   Procedure: BIOPSY;  Surgeon: Vinetta Greening, DO;  [facility];  Service: Endoscopy;;   BREAST LUMPECTOMY  2006   Left  breast   BREAST LUMPECTOMY  1994   Right breast-benign   CHOLECYSTECTOMY N/A 05/10/2016   Procedure: LAPAROSCOPIC CHOLECYSTECTOMY WITH INTRAOPERATIVE CHOLANGIOGRAM;  Surgeon: Alanda Allegra, MD;  Location: [facility];  Service: General;  Laterality: N/A;   COLONOSCOPY  2009    normal colon without evidence of polyps   COLONOSCOPY WITH PROPOFOL  N/A 01/19/2022   Procedure: COLONOSCOPY WITH PROPOFOL ;  Surgeon: Vinetta Greening, DO;  [facility];  Service: Endoscopy;  Laterality: N/A;  7:30am   ESOPHAGOGASTRODUODENOSCOPY  2009   normal stomach,esophagues without mass,reosion   ESOPHAGOGASTRODUODENOSCOPY  09/17/2011   SLF: Mild gastritis/NO SOURCE FOR IRON DEFICINECY ANEMIA IDENTIFIED   ESOPHAGOGASTRODUODENOSCOPY N/A 12/02/2012   SLF: 1. No source for dyspepsia identified 2. MILD Non-erosive gastritis   ESOPHAGOGASTRODUODENOSCOPY (EGD) WITH PROPOFOL  N/A 04/28/2020   Procedure: ESOPHAGOGASTRODUODENOSCOPY (EGD) WITH PROPOFOL ;  Surgeon: Vinetta Greening, DO;  [facility];  Service: Endoscopy;  Laterality: N/A;  7:30am   GIVENS CAPSULE STUDY  09/26/2011   Procedure: GIVENS CAPSULE STUDY;  Surgeon: Pauleen Borne, MD;  [facility];  Service: Endoscopy;  Laterality: N/A;   ILEOColonoscopy  09/17/2011   SLF: Nl colonoscopy WMO/INTERNA; HEMORRHOIDS/ NO SOURCE FOR IRON DEFICIENCY ANEMA IDENTIFIED   LAPAROSCOPIC APPENDECTOMY N/A 06/25/2018   Procedure: APPENDECTOMY LAPAROSCOPIC;  Surgeon: Larrie Po,  Lavonia Powers, MD;  Location: [facility];  Service: General;  Laterality: N/A;   POLYPECTOMY  01/19/2022   Procedure: POLYPECTOMY;  Surgeon: Vinetta Greening, DO;  [facility];  Service: Endoscopy;;   Family History  Problem Relation Age of Onset   Alzheimer's disease Mother    Hypertension Mother    Liver cancer Father    Diabetes Sister    Stroke Sister    Heart disease Sister    Heart disease Brother    Diabetes Brother    Colon cancer Maternal Aunt    Cancer - Colon Cousin    Anesthesia problems Neg Hx    Hypotension Neg Hx    Malignant hyperthermia Neg Hx    Pseudochol deficiency Neg Hx    Colon polyps Neg Hx    Social History   Socioeconomic History   Marital status: Married    Spouse name: Not on file   Number of children: Not on  file   Years of education: Not on file   Highest education level: Not on file  Occupational History   Not on file  Tobacco Use   Smoking status: Never   Smokeless tobacco: Never  Vaping Use   Vaping status: Never Used  Substance and Sexual Activity   Alcohol use: No   Drug use: No   Sexual activity: Not Currently  Other Topics Concern   Not on file  Social History Narrative   Not on file   Social Drivers of Health   Financial Resource Strain: Low Risk  (09/18/2023)   Overall Financial Resource Strain (CARDIA)    Difficulty of Paying Living Expenses: Not hard at all  Food Insecurity: No Food Insecurity (09/18/2023)   Hunger Vital Sign    Worried About Running Out of Food in the Last Year: Never true    Ran Out of Food in the Last Year: Never true  Transportation Needs: No Transportation Needs (09/18/2023)   PRAPARE - Administrator, Civil Service (Medical): No    Lack of Transportation (Non-Medical): No  Physical Activity: Insufficiently Active (09/18/2023)   Exercise Vital Sign    Days of Exercise per Week: 7 days    Minutes of Exercise per Session: 20 min  Stress: No Stress Concern Present (09/18/2023)   [employer] of Occupational Health - Occupational Stress Questionnaire    Feeling of Stress : Not at all  Social Connections: Socially Integrated (09/18/2023)   Social Connection and Isolation Panel [NHANES]    Frequency of Communication with Friends and Family: More than three times a week    Frequency of Social Gatherings with Friends and Family: More than three times a week    Attends Religious Services: More than 4 times per year    Active Member of [employer] or Organizations: Yes    Attends Engineer, structural: More than 4 times per year    Marital Status: Married    Tobacco Counseling Counseling given: Yes   Clinical Intake:  Pre-visit preparation completed: Yes  Pain : No/denies pain     BMI - recorded: 21.3 Nutritional Status:  BMI of 19-24  Normal Nutritional Risks: None Diabetes: No  How often do you need to have someone help you when you read instructions, pamphlets, or other written materials from your doctor or pharmacy?: 1 - Never  Interpreter Needed?: No  Information entered by :: Kairon Shock w CMA   Activities of Daily Living    09/18/2023    1:19 PM  In your present state of health, do you have any difficulty performing the following activities:  Hearing? 0  Vision? 0  Difficulty concentrating or making decisions? 0  Walking or climbing stairs? 0  Dressing or bathing? 0  Doing errands, shopping? 0  Preparing Food and eating ? N  Using the Toilet? N  In the past six months, have you accidently leaked urine? N  Do you have problems with loss of bowel control? N  Managing your Medications? N  Managing your Finances? N  Housekeeping or managing your Housekeeping? N    Patient Care Team: Tobi Fortes, MD as PCP - General (Internal Medicine) Amanda Jungling, Joyceann No, MD as PCP - Cardiology (Cardiology) Alyce Jubilee, MD (Inactive) as Attending Physician (Gastroenterology) Vinetta Greening, DO as Consulting Physician (Internal Medicine)  Indicate any recent Medical Services you may have received from other than [facility] providers in the past year (date may be approximate).     Assessment:   This is a routine wellness examination for Lani.  Hearing/Vision screen Hearing Screening - Comments:: Patient denies any hearing difficulties.   Vision Screening - Comments:: Wears rx glasses - up to date with routine eye exams  Lucendia Rusk [facility] [place]   Goals Addressed             This Visit's Progress    Patient Stated       To remain healthy, active, and independent.        Depression Screen    09/18/2023    1:20 PM 06/10/2023    1:14 PM 08/02/2014    9:43 AM  PHQ 2/9 Scores  PHQ - 2 Score 0 0 0  PHQ- 9 Score 0 3     Fall Risk    09/18/2023    1:19 PM 06/10/2023    1:14 PM  08/02/2014    9:43 AM  Fall Risk   Falls in the past year? 0 1 No  Number falls in past yr: 0 0   Injury with Fall? 0 0   Risk for fall due to : No Fall Risks    Follow up Falls prevention discussed      MEDICARE RISK AT HOME: Medicare Risk at Home Any stairs in or around the home?: Yes If so, are there any without handrails?: No Home free of loose throw rugs in walkways, pet beds, electrical cords, etc?: Yes Adequate lighting in your home to reduce risk of falls?: Yes Life alert?: No Use of a cane, walker or w/c?: No Grab bars in the bathroom?: No Shower chair or bench in shower?: No Elevated toilet seat or a handicapped toilet?: No  TIMED UP AND GO:  Was the test performed? No    Cognitive Function:        09/18/2023    1:18 PM  6CIT Screen  What Year? 0 points  What month? 0 points  What time? 0 points  Count back from 20 0 points  Months in reverse 0 points  Repeat phrase 0 points  Total Score 0 points    Immunizations Immunization History  Administered Date(s) Administered   Influenza-Unspecified 06/07/2015    TDAP status: Up to date  Flu Vaccine status: Up to date  Pneumococcal vaccine status: Due, Education has been provided regarding the importance of this vaccine. Advised may receive this vaccine at local pharmacy or Health Dept. Aware to provide a copy of the vaccination record if obtained from local pharmacy or Health Dept.  Verbalized acceptance and understanding.  Covid-19 vaccine status: Information provided on how to obtain vaccines.   Qualifies for Shingles Vaccine? Yes   Zostavax completed No   Shingrix Completed?: No.    Education has been provided regarding the importance of this vaccine. Patient has been advised to call insurance company to determine out of pocket expense if they have not yet received this vaccine. Advised may also receive vaccine at local pharmacy or Health Dept. Verbalized acceptance and understanding.  Screening  Tests Health Maintenance  Topic Date Due   Medicare Annual Wellness (AWV)  Never done   Hepatitis C Screening  Never done   DTaP/Tdap/Td (1 - Tdap) Never done   Zoster Vaccines- Shingrix (1 of 2) Never done   Pneumonia Vaccine 46+ Years old (1 of 1 - PCV) Never done   DEXA SCAN  08/25/2020   COVID-19 Vaccine (1 - 2024-25 season) Never done   INFLUENZA VACCINE  12/02/2023 (Originally 04/04/2023)   MAMMOGRAM  06/09/2024   Colonoscopy  01/20/2032   HPV VACCINES  Aged Out    Health Maintenance  Health Maintenance Due  Topic Date Due   Medicare Annual Wellness (AWV)  Never done   Hepatitis C Screening  Never done   DTaP/Tdap/Td (1 - Tdap) Never done   Zoster Vaccines- Shingrix (1 of 2) Never done   Pneumonia Vaccine 86+ Years old (1 of 1 - PCV) Never done   DEXA SCAN  08/25/2020   COVID-19 Vaccine (1 - 2024-25 season) Never done    Colorectal cancer screening: No longer required.   Mammogram status: Completed 06/10/2023. Repeat every year  Bone Density status: Ordered 09/18/2023. Pt provided with contact info and advised to call to schedule appt.  Lung Cancer Screening: (Low Dose CT Chest recommended if Age 31-80 years, 20 pack-year currently smoking OR have quit w/in 15years.) does not qualify.   Lung Cancer Screening Referral: na  Additional Screening:  Hepatitis C Screening: does qualify; Ordered 09/18/2023  Vision Screening: Recommended annual ophthalmology exams for early detection of glaucoma and other disorders of the eye. Is the patient up to date with their annual eye exam?  Yes  Who is the provider or what is the name of the office in which the patient attends annual eye exams? Lucendia Rusk w/ [facility]   Dental Screening: Recommended annual dental exams for proper oral hygiene  Diabetic Foot Exam: na  Community Resource Referral / Chronic Care Management: CRR required this visit?  No   CCM required this visit?  No     Plan:     I have  personally reviewed and noted the following in the patient's chart:   Medical and social history Use of alcohol, tobacco or illicit drugs  Current medications and supplements including opioid prescriptions. Patient is not currently taking opioid prescriptions. Functional ability and status Nutritional status Physical activity Advanced directives List of other physicians Hospitalizations, surgeries, and ER visits in previous 12 months Vitals Screenings to include cognitive, depression, and falls Referrals and appointments  In addition, I have reviewed and discussed with patient certain preventive protocols, quality metrics, and best practice recommendations. A written personalized care plan for preventive services as well as general preventive health recommendations were provided to patient.     Sallye Crease Gianni Fuchs, CMA   09/18/2023   After Visit Summary: (Mail) Due to this being a telephonic visit, the after visit summary with patients personalized plan was offered to patient via mail

## 2023-09-23 NOTE — Progress Notes (Unsigned)
GI Office Note    Referring Provider: Billie Lade, MD Primary Care Physician:  Billie Lade, MD Primary Gastroenterologist: Hennie Duos. Marletta Lor, DO  Date:  09/24/2023  ID:  [name] [name], DOB March 07, 1951, MRN [id number]   Chief Complaint   Chief Complaint  Patient presents with   Follow-up    Follow up. Still having some of the same issues. Stools are still yellowish color.    History of Present Illness  [name] [name] is a 73 y.o. female with a history of chronic GERD presenting today with complaint of abnormal colored stools.    Last colonoscopy 09/17/2011: -normal colonoscopy -small internal hemorrhoids.   -Repeat in 10 years   Last EGD 04/28/20: -benign-appearing esophageal stenosis s/p dilation -gastritis (negative H. Pylori) -normal duodenum   Blood work 05/10/2022 with T. bili 1.3, indirect bilirubin 1, direct bilirubin 0.3. Normal GGT. Right upper quadrant ultrasound 05/14/22 normal besides postcholecystectomy changes.    Fecal elastase mildly low at 181.   CT A/P with contrast 10/31/22 unremarkable (normal pancreas).    OV  01/10/23. Patient reports light-colored/yellow stools, reporting that they are abnormal and have continued decline since she stopped iron last year.  Concerned given her father died from liver cancer.  Has also reported mild epigastric discomfort at times but intermittent nature.  Dexilant had not been helpful in the pa,st takes Mylanta on occasion as needed.  Symptoms usually relatively well-controlled with diet.Taking carafate as well as PPI for  GERD/upper GI symptoms. Continued complaint of ongoing light colored/yellow stools ongoing since stopping iron last year. Did not notice a difference with once daily pancreatic enzymes. Given higher strength pancreatic enzyme and continue PPI regimen - consider reducing to once daily.    Patient called with progress report that pancreatic enzyme replacement (60000 units of zenpep) was causing constipation and  other side effects including fatigue, dry mouth, scratchy throat.   Last office visit 03/11/23.  Off iron since 2022.  Since colonoscopy she has remained concerned about her stool.  Reported her blood work in March with her PCP was normal.  She stated her sister had a portion of her pancreas removed and although not blood related she wanted to make sure her pancreas was okay.  Did not see a stool color change with low-dose Zenpep and also tried higher dose and did not see a change in color either.  Last several weeks she reported she had not had any diarrhea and stools have been more solid, sometimes going 1-2 times per day concerned about her urgency at times.  Stool is yellow-brown color notices that more when cleaning herself.  Trying to watch what she eats but believes she was eating more fatty foods than she should.. Advised fecal fat testing if symptoms worsen. Follow low fat diet. Carafate for severe acid reflux as needed. Advised to capture picture of stool. PPI BID and tums for breakthrough recommended.    Today:  Patient brought pictures of her stools and they are semi formed and a light brown color. At times when she eats chocolate her stool can be darker. Her stools more recently have been more formed and less diarrhea. Intermittently has urgency, sometimes takes pepto before going out.   She tried carafate for a few weeks and that helped her reflux and now she is able to eat what she wants without issue and take tums as needed. Taking her pantoprazole BID. Has had an increased gas lately - suspects potentially related to her  diet. One evening a few weeks ago she had significant gas and every time she had some mild stool with her gas. She took a couple doses of pepto and that went away. Having daily stools, used to go 2-3 times a day but now going a good size and once daily. Most reflux symptoms tend to be at night.   Has good appetite, no weight loss, N/V, dysphagia.   Wt Readings from Last 3  Encounters:  09/24/23 131 lb 6.4 oz (59.6 kg)  09/18/23 128 lb (58.1 kg)  06/10/23 128 lb 12.8 oz (58.4 kg)    Current Outpatient Medications  Medication Sig Dispense Refill   acetaminophen (TYLENOL) 325 MG tablet Take 325-650 mg by mouth daily as needed (pain).     alum & mag hydroxide-simeth (MAALOX/MYLANTA) 200-200-20 MG/5ML suspension Take 15-30 mLs by mouth daily as needed for indigestion or heartburn.     atorvastatin (LIPITOR) 20 MG tablet Take 1 tablet (20 mg total) by mouth at bedtime. 30 tablet 0   Calcium Carb-Cholecalciferol (CALCIUM + D3 PO) Take 1 tablet by mouth in the morning.     calcium elemental as carbonate (BARIATRIC TUMS ULTRA) 400 MG chewable tablet Chew 1 tablet by mouth 3 (three) times daily as needed for indigestion or heartburn.     clobetasol cream (TEMOVATE) 0.05 % Apply 1 application topically daily as needed (irritation).     enalapril (VASOTEC) 10 MG tablet Take 1 tablet (10 mg total) by mouth in the morning. 30 tablet 0   hydrocortisone cream 1 % Apply 1 application. topically 2 (two) times daily as needed (skin irritation/itching).     LORazepam (ATIVAN) 0.5 MG tablet Take 0.5 mg by mouth at bedtime.     oxymetazoline (AFRIN) 0.05 % nasal spray Place 1 spray into both nostrils at bedtime as needed for congestion.     pantoprazole (PROTONIX) 40 MG tablet TAKE 1 TABLET(40 MG) BY MOUTH TWICE DAILY BEFORE A MEAL 60 tablet 11   valACYclovir (VALTREX) 500 MG tablet Take 500 mg by mouth as needed (for outbreaks).   12   vitamin B-12 (CYANOCOBALAMIN) 1000 MCG tablet Take 1,000 mcg by mouth 3 (three) times a week. In the morning     Vitamins A & D (VITAMIN A & D) ointment Apply 1 application  topically as needed (irritation).     Pancrelipase, Lip-Prot-Amyl, (ZENPEP) 40000-126000 units CPEP Take 1 capsule (40,000 Units total) by mouth 3 (three) times daily with meals. 1 capsule with snacks (Patient not taking: Reported on 09/24/2023) 150 capsule 3   sucralfate  (CARAFATE) 1 g tablet Take 1 tablet (1 g total) by mouth 4 (four) times daily -  with meals and at bedtime. (Patient not taking: Reported on 09/24/2023) 120 tablet 5   No current facility-administered medications for this visit.    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   Functional dyspepsia 11/2012   APR 2014: PT DECLINED BRAVO. PPIs-PROTOIX/PRILOSEC-->DEXILANT-->NEXIUM(TOO EXPENSIVE)   GERD (gastroesophageal reflux disease)    High cholesterol    Hypertension    Personal history of chemotherapy 2006   Personal history of radiation therapy 2006    Past Surgical History:  Procedure Laterality Date   BALLOON DILATION N/A 04/28/2020   Procedure: BALLOON DILATION;  Surgeon: Lanelle Bal, DO;  [facility];  Service: Endoscopy;  Laterality: N/A;   BIOPSY  04/28/2020   Procedure: BIOPSY;  Surgeon:  Lanelle Bal, DO;  [facility];  Service: Endoscopy;;   BREAST LUMPECTOMY  2006   Left  breast   BREAST LUMPECTOMY  1994   Right breast-benign   CHOLECYSTECTOMY N/A 05/10/2016   Procedure: LAPAROSCOPIC CHOLECYSTECTOMY WITH INTRAOPERATIVE CHOLANGIOGRAM;  Surgeon: Franky Macho, MD;  Location: [facility];  Service: General;  Laterality: N/A;   COLONOSCOPY  2009   normal colon without evidence of polyps   COLONOSCOPY WITH PROPOFOL N/A 01/19/2022   Procedure: COLONOSCOPY WITH PROPOFOL;  Surgeon: Lanelle Bal, DO;  [facility];  Service: Endoscopy;  Laterality: N/A;  7:30am   ESOPHAGOGASTRODUODENOSCOPY  2009   normal stomach,esophagues without mass,reosion   ESOPHAGOGASTRODUODENOSCOPY  09/17/2011   SLF: Mild gastritis/NO SOURCE FOR IRON DEFICINECY ANEMIA IDENTIFIED   ESOPHAGOGASTRODUODENOSCOPY N/A 12/02/2012   SLF: 1. No source for dyspepsia identified 2. MILD Non-erosive gastritis   ESOPHAGOGASTRODUODENOSCOPY (EGD) WITH PROPOFOL N/A 04/28/2020   Procedure:  ESOPHAGOGASTRODUODENOSCOPY (EGD) WITH PROPOFOL;  Surgeon: Lanelle Bal, DO;  [facility];  Service: Endoscopy;  Laterality: N/A;  7:30am   GIVENS CAPSULE STUDY  09/26/2011   Procedure: GIVENS CAPSULE STUDY;  Surgeon: Arlyce Harman, MD;  [facility];  Service: Endoscopy;  Laterality: N/A;   ILEOColonoscopy  09/17/2011   SLF: Nl colonoscopy WMO/INTERNA; HEMORRHOIDS/ NO SOURCE FOR IRON DEFICIENCY ANEMA IDENTIFIED   LAPAROSCOPIC APPENDECTOMY N/A 06/25/2018   Procedure: APPENDECTOMY LAPAROSCOPIC;  Surgeon: Franky Macho, MD;  Location: [facility];  Service: General;  Laterality: N/A;   POLYPECTOMY  01/19/2022   Procedure: POLYPECTOMY;  Surgeon: Lanelle Bal, DO;  [facility];  Service: Endoscopy;;    Family History  Problem Relation Age of Onset   Alzheimer's disease Mother    Hypertension Mother    Liver cancer Father    Diabetes Sister    Stroke Sister    Heart disease Sister    Heart disease Brother    Diabetes Brother    Colon cancer Maternal Aunt    Cancer - Colon Cousin    Anesthesia problems Neg Hx    Hypotension Neg Hx    Malignant hyperthermia Neg Hx    Pseudochol deficiency Neg Hx    Colon polyps Neg Hx     Allergies as of 09/24/2023 - Review Complete 09/24/2023  Allergen Reaction Noted   Sulfa antibiotics Itching and Rash 05/09/2016   Tape Rash 05/10/2016    Social History   Socioeconomic History   Marital status: Married    Spouse name: Not on file   Number of children: Not on file   Years of education: Not on file   Highest education level: Not on file  Occupational History   Not on file  Tobacco Use   Smoking status: Never   Smokeless tobacco: Never  Vaping Use   Vaping status: Never Used  Substance and Sexual Activity   Alcohol use: No   Drug use: No   Sexual activity: Not Currently  Other Topics Concern   Not on file  Social History Narrative   Not on file   Social Drivers of Health   Financial Resource  Strain: Low Risk  (09/18/2023)   Overall Financial Resource Strain (CARDIA)    Difficulty of Paying Living Expenses: Not hard at all  Food Insecurity: No Food Insecurity (09/18/2023)   Hunger Vital Sign    Worried About Running Out of Food in the Last Year: Never true    Ran Out of Food in  the Last Year: Never true  Transportation Needs: No Transportation Needs (09/18/2023)   PRAPARE - Administrator, Civil Service (Medical): No    Lack of Transportation (Non-Medical): No  Physical Activity: Insufficiently Active (09/18/2023)   Exercise Vital Sign    Days of Exercise per Week: 7 days    Minutes of Exercise per Session: 20 min  Stress: No Stress Concern Present (09/18/2023)   [employer] of Occupational Health - Occupational Stress Questionnaire    Feeling of Stress : Not at all  Social Connections: Socially Integrated (09/18/2023)   Social Connection and Isolation Panel [NHANES]    Frequency of Communication with Friends and Family: More than three times a week    Frequency of Social Gatherings with Friends and Family: More than three times a week    Attends Religious Services: More than 4 times per year    Active Member of [employer] or Organizations: Yes    Attends Engineer, structural: More than 4 times per year    Marital Status: Married     Review of Systems   Gen: Denies fever, chills, anorexia. Denies fatigue, weakness, weight loss.  CV: Denies chest pain, palpitations, syncope, peripheral edema, and claudication. Resp: Denies dyspnea at rest, cough, wheezing, coughing up blood, and pleurisy. GI: See HPI Derm: Denies rash, itching, dry skin Psych: Denies depression, anxiety, memory loss, confusion. No homicidal or suicidal ideation.  Heme: Denies bruising, bleeding, and enlarged lymph nodes.  Physical Exam   BP 139/72 (BP Location: Right Arm, Patient Position: Sitting, Cuff Size: Large)   Pulse (!) 120   Temp (!) 96.9 F (36.1 C) (Temporal)   Ht 5'  5" (1.651 m)   Wt 131 lb 6.4 oz (59.6 kg)   BMI 21.87 kg/m   General:   Alert and oriented. No distress noted. Pleasant and cooperative.  Head:  Normocephalic and atraumatic. Eyes:  Conjuctiva clear without scleral icterus. Mouth:  Oral mucosa pink and moist. Good dentition. No lesions. Abdomen:  +BS, soft, non-tender and non-distended. No rebound or guarding. No HSM or masses noted. Rectal: deferred Msk:  Symmetrical without gross deformities. Normal posture. Extremities:  Without edema. Neurologic:  Alert and  oriented x4 Psych:  Alert and cooperative. Normal mood and affect.  Assessment  [name] [name] is a 73 y.o. female with a history of chronic GERD presenting today with complaint of abnormal stool color.  GERD: Reflux well-controlled on pantoprazole 40 mg twice daily.  Will use Tums as needed for breakthrough but this is very rare.  Used to be she had to follow a strict diet, recently has been eating any sort of food that she likes to eat which used to be bothersome for her without issue.  Given she has noticed improvement in her reflux we discussed potentially weaning pantoprazole.  I gave her instructions on reducing pantoprazole to once a day and continuing her evening dose given her breakthrough symptoms were primarily in the evenings.   Pancreatic insufficiency, diarrhea, abnormal stool color: She continues to have some anxiety in regards to her stools being a more yellow color as they were not usually this color.  She has had mildly elevated bilirubin (1.3) in the past with direct 0.3 and indirect 1.  She had normal GGT and RUQ [place] was normal with postcholecystectomy changes.  She has had a fecal elastase in the past also that was mildly low at 181 and had a trial of Zenpep in the past which improved  some of her stool consistency but not the change in color.  She has had CT imaging in the past in February 2024 with no evidence of pancreatic malignancy or chronic pancreatitis.  It is  possible that some of her bloating as well as looser stools and yellow stools could be secondary to a higher fat diet or other dietary factors.  There has been mention of potential Gilbert's syndrome in the past however her stools have been more of a yellow/light brown color versus a pale/clay colored stool that is seen with Gilbert's.  Given her ongoing concern of her stool color we will reassess fecal elastase to reassess for pancreatic insufficiency and check fecal fat qualitative.  She does not have any concerning alarm symptoms for pancreatic or biliary etiology.  Stress could be playing a role.  PLAN   Wean Pantoprazole to once daily. Refilled today. Instructions provided.  Fecal elastase and fecal fat qualitative. Low fat diet Tums as needed.  GERD diet Follow up in 6 months.     Brooke Bonito, MSN, FNP-BC, AGACNP-BC [facility]

## 2023-09-24 ENCOUNTER — Ambulatory Visit: Payer: PPO | Admitting: Gastroenterology

## 2023-09-24 ENCOUNTER — Encounter: Payer: Self-pay | Admitting: Gastroenterology

## 2023-09-24 VITALS — BP 139/72 | HR 120 | Temp 96.9°F | Ht 65.0 in | Wt 131.4 lb

## 2023-09-24 DIAGNOSIS — R899 Unspecified abnormal finding in specimens from other organs, systems and tissues: Secondary | ICD-10-CM

## 2023-09-24 DIAGNOSIS — R195 Other fecal abnormalities: Secondary | ICD-10-CM

## 2023-09-24 DIAGNOSIS — G8929 Other chronic pain: Secondary | ICD-10-CM

## 2023-09-24 DIAGNOSIS — K219 Gastro-esophageal reflux disease without esophagitis: Secondary | ICD-10-CM

## 2023-09-24 MED ORDER — PANTOPRAZOLE SODIUM 40 MG PO TBEC
DELAYED_RELEASE_TABLET | ORAL | 11 refills | Status: DC
Start: 2023-09-24 — End: 2023-09-24

## 2023-09-24 MED ORDER — PANTOPRAZOLE SODIUM 40 MG PO TBEC: DELAYED_RELEASE_TABLET | ORAL | 3 refills | Status: DC

## 2023-09-24 NOTE — Patient Instructions (Addendum)
If you would like to try to wean down pantoprazole to see if you have worsening reflux symptoms that would be fine.  You can start with removing your morning dose of pantoprazole every other day for about 1 week and then omitted altogether with only taking 1 tablet in the evening that would be fine.  You can continue to use Tums as needed.  If you have worsening reflux symptoms while weaning pantoprazole you can resume back to twice daily.  Follow a GERD diet:  Avoid fried, fatty, greasy, spicy, citrus foods. Avoid caffeine and carbonated beverages. Avoid chocolate. Try eating 4-6 small meals a day rather than 3 large meals. Do not eat within 3 hours of laying down. Prop head of bed up on wood or bricks to create a 6 inch incline.   I am providing you some lab slips today for stool studies.  This is a test the fact quality in your stool as well as reassess for pancreatic insufficiency. A diet high in fat or gluten can also lead to yellow stool.  Also eating carrots, turmeric, and sweet potatoes can also make stool yellow.  Given you do not have a gallbladder anymore food and digestive enzymes can move through your small intestine more quickly and stress/anxiety can contribute more to the change in stool color as well.  Avoid gas-producing foods (eg, garlic, beans cabbage, legumes, onions, broccoli, brussel sprouts, wheat, and potatoes).  Any combination of these foods could be contributing to some of the bloating and gassiness that you experience.  Also carbonated beverages, sucking on hard candy, and drinking through a straw can cause excess gas in the GI tract.  Follow-up in 6 months, sooner if needed.  It was a pleasure to see you today. I want to create trusting relationships with patients. If you receive a survey regarding your visit,  I greatly appreciate you taking time to fill this out on paper or through your MyChart. I value your feedback.  Brooke Bonito, MSN, FNP-BC,  AGACNP-BC [facility]

## 2023-10-18 ENCOUNTER — Other Ambulatory Visit: Payer: Self-pay | Admitting: Internal Medicine

## 2023-10-18 DIAGNOSIS — F5104 Psychophysiologic insomnia: Secondary | ICD-10-CM

## 2023-10-18 MED ORDER — LORAZEPAM 0.5 MG PO TABS
0.5000 mg | ORAL_TABLET | Freq: Every evening | ORAL | 0 refills | Status: DC | PRN
Start: 2023-10-18 — End: 2023-11-18

## 2023-10-18 NOTE — Telephone Encounter (Signed)
Copied from CRM [id number]. Topic: Clinical - Medication Refill >> Oct 18, 2023  2:13 PM Elle L wrote: Most Recent Primary Care Visit:  Provider: Recardo Evangelist A  Department: [facility]  Visit Type: MEDICARE AWV, INITIAL  Date: 09/18/2023  Medication: LORazepam (ATIVAN) 0.5 MG tablet   The patient states that Dr. Durwin Nora was going to call it in at her last appointment.   Has the patient contacted their pharmacy? Yes  Is this the correct pharmacy for this prescription? Yes  This is the patient's preferred pharmacy:  [facility] [place], [place] - [id number] [place] [address] Phone: [phone] Fax: [phone]  Has the prescription been filled recently? No  Is the patient out of the medication? No she has enough to last her another week.  Has the patient been seen for an appointment in the last year OR does the patient have an upcoming appointment? Yes  Can we respond through MyChart? No the patient's call back number [phone] if needed.   Agent: Please be advised that Rx refills may take up to 3 business days. We ask that you follow-up with your pharmacy.

## 2023-11-15 DIAGNOSIS — R195 Other fecal abnormalities: Secondary | ICD-10-CM | POA: Diagnosis not present

## 2023-11-15 DIAGNOSIS — R899 Unspecified abnormal finding in specimens from other organs, systems and tissues: Secondary | ICD-10-CM | POA: Diagnosis not present

## 2023-11-16 ENCOUNTER — Other Ambulatory Visit: Payer: Self-pay | Admitting: Internal Medicine

## 2023-11-16 DIAGNOSIS — F5104 Psychophysiologic insomnia: Secondary | ICD-10-CM

## 2023-11-20 LAB — PANCREATIC ELASTASE, FECAL: Pancreatic Elastase, Fecal: >800 ug Elast./g (ref >200)

## 2023-11-20 LAB — FECAL FAT, QUALITATIVE

## 2023-12-09 ENCOUNTER — Encounter (HOSPITAL_COMMUNITY): Payer: Self-pay | Admitting: Hematology and Oncology

## 2023-12-09 ENCOUNTER — Encounter: Payer: PPO | Admitting: Internal Medicine

## 2023-12-16 ENCOUNTER — Encounter: Payer: Self-pay | Admitting: Internal Medicine

## 2023-12-16 ENCOUNTER — Ambulatory Visit (INDEPENDENT_AMBULATORY_CARE_PROVIDER_SITE_OTHER): Payer: PPO | Admitting: Internal Medicine

## 2023-12-16 VITALS — BP 126/64 | HR 111 | Ht 65.0 in | Wt 122.8 lb

## 2023-12-16 DIAGNOSIS — I10 Essential (primary) hypertension: Secondary | ICD-10-CM | POA: Diagnosis not present

## 2023-12-16 DIAGNOSIS — F5104 Psychophysiologic insomnia: Secondary | ICD-10-CM | POA: Diagnosis not present

## 2023-12-16 DIAGNOSIS — K8689 Other specified diseases of pancreas: Secondary | ICD-10-CM | POA: Diagnosis not present

## 2023-12-16 DIAGNOSIS — Z0001 Encounter for general adult medical examination with abnormal findings: Secondary | ICD-10-CM | POA: Insufficient documentation

## 2023-12-16 DIAGNOSIS — K219 Gastro-esophageal reflux disease without esophagitis: Secondary | ICD-10-CM

## 2023-12-16 DIAGNOSIS — E782 Mixed hyperlipidemia: Secondary | ICD-10-CM

## 2023-12-16 DIAGNOSIS — D649 Anemia, unspecified: Secondary | ICD-10-CM

## 2023-12-16 DIAGNOSIS — Z853 Personal history of malignant neoplasm of breast: Secondary | ICD-10-CM

## 2023-12-16 MED ORDER — ATORVASTATIN CALCIUM 20 MG PO TABS
20.0000 mg | ORAL_TABLET | Freq: Every day | ORAL | 3 refills | Status: AC
Start: 2023-12-16 — End: ?

## 2023-12-16 MED ORDER — ENALAPRIL MALEATE 10 MG PO TABS: 10.0000 mg | ORAL_TABLET | Freq: Every morning | ORAL | 3 refills | Status: AC

## 2023-12-16 MED ORDER — LORAZEPAM 0.5 MG PO TABS: 0.5000 mg | ORAL_TABLET | Freq: Every evening | ORAL | 2 refills | Status: DC | PRN

## 2023-12-16 NOTE — Patient Instructions (Signed)
 It was a pleasure to see you today.  Thank you for giving us  the opportunity to be involved in your care.  Below is a brief recap of your visit and next steps.  We will plan to see you again in 1 year.  Summary Annual physical completed today Repeat labs ordered Diagnostic mammogram ordered Follow up in 1 year for annual exam

## 2023-12-16 NOTE — Assessment & Plan Note (Addendum)
 Symptoms are adequately controlled pantoprazole 40 mg twice daily.

## 2023-12-16 NOTE — Assessment & Plan Note (Signed)
Currently prescribed atorvastatin 20 mg daily.  Repeat lipid panel ordered today. 

## 2023-12-16 NOTE — Assessment & Plan Note (Signed)
 Lorazepam 0.5 mg nightly remains effective for management of insomnia.  PMP reviewed and remains appropriate.  Refill provided today.

## 2023-12-16 NOTE — Assessment & Plan Note (Signed)
 Remains adequately controlled with enalapril 10 mg daily.  No medication changes indicated today.

## 2023-12-16 NOTE — Assessment & Plan Note (Signed)
 Her acute concern today is recent discharge from the left breast.  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  Last week she reports a 2-3-day history of clear discharge from the peripheral 0400 region of the left breast.  She applied a warm compress and discharge resolved.  There was no tenderness or overlying erythema when she noted the discharge.  There are no acute findings on exam today.  Postradiation changes are noted under the left breast.  At the site of concern, there is a superficial, dried lesion.  Screening mammogram last updated in October 2024 did not reveal any acute findings.  Given recent history of discharge and past medical history significant for breast cancer, diagnostic mammogram ordered today.

## 2023-12-16 NOTE — Assessment & Plan Note (Signed)
 Annual physical completed today.  Previous records and labs reviewed. -Repeat labs ordered today -Preventative care items are largely up-to-date.  Will assist with scheduling repeat DEXA. -Diagnostic mammogram of the left breast ordered today as otherwise documented -We will tentatively plan for follow-up in 1 year for annual exam

## 2023-12-16 NOTE — Progress Notes (Signed)
 Complete physical exam  Patient: [name] [name]   DOB: 1950-09-06   73 y.o. Female  MRN: [id number]  Subjective:    Chief Complaint  Patient presents with   Annual Exam    Joint pain in right big toe. Leakage from left breast, patient has applied warm compress and it has stopped. Left elbow pain     [name] [name] is a 73 y.o. female who presents today for a complete physical exam. She reports consuming a general diet. The patient does not participate in regular exercise at present. She generally feels well. She reports sleeping poorly. She does have additional problems to discuss today.  Her additional concern today is left breast discharge present last week.  She reports a 2-3-day history of clear discharge from the left breast.  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  Mammogram was last updated in October 2024 and did not demonstrate any acute findings.  She applied a warm compress to her left breast when discharge recently began and that seemed to help.  She is interested in undergoing diagnostic mammogram for further evaluation.  Most recent fall risk assessment:    09/18/2023    1:19 PM  Fall Risk   Falls in the past year? 0  Number falls in past yr: 0  Injury with Fall? 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   Functional dyspepsia 11/2012   APR 2014: PT DECLINED BRAVO. PPIs-PROTOIX/PRILOSEC-->DEXILANT-->NEXIUM(TOO EXPENSIVE)   GERD (gastroesophageal reflux disease)    High  cholesterol    Hypertension    Personal history of chemotherapy 2006   Personal history of radiation therapy 2006   Past Surgical History:  Procedure Laterality Date   BALLOON DILATION N/A 04/28/2020   Procedure: BALLOON DILATION;  Surgeon: Lanelle Bal, DO;  [facility];  Service: Endoscopy;  Laterality: N/A;   BIOPSY  04/28/2020   Procedure: BIOPSY;  Surgeon: Lanelle Bal, DO;  [facility];  Service: Endoscopy;;   BREAST LUMPECTOMY  2006   Left  breast   BREAST LUMPECTOMY  1994   Right breast-benign   CHOLECYSTECTOMY N/A 05/10/2016   Procedure: LAPAROSCOPIC CHOLECYSTECTOMY WITH INTRAOPERATIVE CHOLANGIOGRAM;  Surgeon: Franky Macho, MD;  Location: [facility];  Service: General;  Laterality: N/A;   COLONOSCOPY  2009   normal colon without evidence of polyps   COLONOSCOPY WITH PROPOFOL N/A 01/19/2022   Procedure: COLONOSCOPY WITH PROPOFOL;  Surgeon: Lanelle Bal, DO;  [facility]: [facility];  Service: Endoscopy;  Laterality: N/A;  7:30am   ESOPHAGOGASTRODUODENOSCOPY  2009   normal stomach,esophagues without mass,reosion   ESOPHAGOGASTRODUODENOSCOPY  09/17/2011   SLF: Mild gastritis/NO SOURCE FOR IRON DEFICINECY ANEMIA IDENTIFIED   ESOPHAGOGASTRODUODENOSCOPY N/A 12/02/2012   SLF: 1. No source for dyspepsia identified 2. MILD Non-erosive gastritis   ESOPHAGOGASTRODUODENOSCOPY (EGD) WITH PROPOFOL N/A 04/28/2020   Procedure: ESOPHAGOGASTRODUODENOSCOPY (EGD) WITH PROPOFOL;  Surgeon: Marletta Lor,  Hennie Duos, DO;  [facility];  Service: Endoscopy;  Laterality: N/A;  7:30am   GIVENS CAPSULE STUDY  09/26/2011   Procedure: GIVENS CAPSULE STUDY;  Surgeon: Arlyce Harman, MD;  [facility]: [facility];  Service: Endoscopy;  Laterality: N/A;   ILEOColonoscopy  09/17/2011   SLF: Nl colonoscopy WMO/INTERNA; HEMORRHOIDS/ NO SOURCE FOR IRON DEFICIENCY ANEMA IDENTIFIED   LAPAROSCOPIC APPENDECTOMY N/A 06/25/2018   Procedure: APPENDECTOMY LAPAROSCOPIC;  Surgeon: Franky Macho, MD;  Location: [facility];  Service: General;  Laterality: N/A;   POLYPECTOMY  01/19/2022   Procedure: POLYPECTOMY;  Surgeon: Lanelle Bal, DO;  [facility];  Service: Endoscopy;;   Social History   Tobacco Use   Smoking status: Never   Smokeless tobacco: Never  Vaping Use   Vaping status: Never Used  Substance Use Topics   Alcohol use: No   Drug use: No   Family History  Problem Relation Age of Onset   Alzheimer's disease Mother    Hypertension Mother    Liver cancer Father    Diabetes Sister    Stroke Sister    Heart disease Sister    Heart disease Brother    Diabetes Brother    Colon cancer Maternal Aunt    Cancer - Colon Cousin    Anesthesia problems Neg Hx    Hypotension Neg Hx    Malignant hyperthermia Neg Hx    Pseudochol deficiency Neg Hx    Colon polyps Neg Hx    Allergies  Allergen Reactions   Sulfa Antibiotics Itching and Rash   Tape Rash    Surgical tape causes blisters and rash   Patient Care Team: Billie Lade, MD as PCP - General (Internal Medicine) Wyline Mood Dorothe Pea, MD as PCP - Cardiology (Cardiology) Lanelle Bal, DO as Consulting Physician (Internal Medicine) Daisy Lazar, DO [facility])   Outpatient Medications Prior to Visit  Medication Sig   acetaminophen (TYLENOL) 325 MG tablet Take 325-650 mg by mouth daily as needed (pain).   alum & mag hydroxide-simeth (MAALOX/MYLANTA) 200-200-20 MG/5ML suspension Take 15-30 mLs by mouth daily as needed for indigestion or heartburn.   Calcium Carb-Cholecalciferol (CALCIUM + D3 PO) Take 1 tablet by mouth in the morning.   calcium elemental as carbonate (BARIATRIC TUMS ULTRA) 400 MG chewable tablet Chew 1 tablet by mouth 3 (three) times daily as needed for indigestion or heartburn.   clobetasol cream (TEMOVATE) 0.05 % Apply 1 application topically daily as needed (irritation).   hydrocortisone cream 1 % Apply 1 application. topically 2 (two) times daily as needed (skin  irritation/itching).   oxymetazoline (AFRIN) 0.05 % nasal spray Place 1 spray into both nostrils at bedtime as needed for congestion.   Pancrelipase, Lip-Prot-Amyl, (ZENPEP) 40000-126000 units CPEP Take 1 capsule (40,000 Units total) by mouth 3 (three) times daily with meals. 1 capsule with snacks (Patient not taking: Reported on 09/24/2023)   pantoprazole (PROTONIX) 40 MG tablet TAKE 1 TABLET(40 MG) BY MOUTH TWICE DAILY BEFORE A MEAL   sucralfate (CARAFATE) 1 g tablet Take 1 tablet (1 g total) by mouth 4 (four) times daily -  with meals and at bedtime. (Patient not taking: Reported on 09/24/2023)   valACYclovir (VALTREX) 500 MG tablet Take 500 mg by mouth as needed (for outbreaks).    vitamin B-12 (CYANOCOBALAMIN) 1000 MCG tablet Take 1,000 mcg by mouth 3 (three) times a week. In the morning   Vitamins A & D (VITAMIN A & D) ointment Apply 1  application  topically as needed (irritation).   [DISCONTINUED] atorvastatin (LIPITOR) 20 MG tablet Take 1 tablet (20 mg total) by mouth at bedtime.   [DISCONTINUED] enalapril (VASOTEC) 10 MG tablet Take 1 tablet (10 mg total) by mouth in the morning.   [DISCONTINUED] LORazepam (ATIVAN) 0.5 MG tablet Take 1 tablet (0.5 mg total) by mouth at bedtime as needed for anxiety or sleep.   No facility-administered medications prior to visit.   Review of Systems  Constitutional:  Negative for chills and fever.  HENT:  Negative for sore throat.   Respiratory:  Negative for cough and shortness of breath.   Cardiovascular:  Negative for chest pain, palpitations and leg swelling.  Gastrointestinal:  Negative for abdominal pain, blood in stool, constipation, diarrhea, nausea and vomiting.  Genitourinary:  Negative for dysuria and hematuria.  Musculoskeletal:  Negative for myalgias.       Recent history of discharge from the left breast  Skin:  Negative for itching and rash.  Neurological:  Negative for dizziness and headaches.  Psychiatric/Behavioral:  Negative for  depression and suicidal ideas.       Objective:     BP 126/64   Pulse (!) 111   Ht 5\' 5"  (1.651 m)   Wt 122 lb 12.8 oz (55.7 kg)   SpO2 96%   BMI 20.43 kg/m  BP Readings from Last 3 Encounters:  12/16/23 126/64  09/24/23 139/72  06/10/23 137/88   Physical Exam Vitals reviewed.  Constitutional:      General: She is not in acute distress.    Appearance: Normal appearance. She is not toxic-appearing.  HENT:     Head: Normocephalic and atraumatic.     Right Ear: External ear normal.     Left Ear: External ear normal.     Nose: Nose normal. No congestion or rhinorrhea.     Mouth/Throat:     Mouth: Mucous membranes are moist.     Pharynx: Oropharynx is clear. No oropharyngeal exudate or posterior oropharyngeal erythema.  Eyes:     General: No scleral icterus.    Extraocular Movements: Extraocular movements intact.     Conjunctiva/sclera: Conjunctivae normal.     Pupils: Pupils are equal, round, and reactive to light.  Cardiovascular:     Rate and Rhythm: Normal rate and regular rhythm.     Pulses: Normal pulses.     Heart sounds: Normal heart sounds. No murmur heard.    No friction rub. No gallop.  Pulmonary:     Effort: Pulmonary effort is normal.     Breath sounds: Normal breath sounds. No wheezing, rhonchi or rales.  Abdominal:     General: Abdomen is flat. Bowel sounds are normal. There is no distension.     Palpations: Abdomen is soft.     Tenderness: There is no abdominal tenderness.  Musculoskeletal:        General: No swelling. Normal range of motion.     Cervical back: Normal range of motion.     Right lower leg: No edema.     Left lower leg: No edema.  Lymphadenopathy:     Cervical: No cervical adenopathy.  Skin:    General: Skin is warm and dry.     Capillary Refill: Capillary refill takes less than 2 seconds.     Coloration: Skin is not jaundiced.  Neurological:     General: No focal deficit present.     Mental Status: She is alert and oriented to  person, place, and time.  Psychiatric:        Mood and Affect: Mood normal.        Behavior: Behavior normal.   Last CBC Lab Results  Component Value Date   WBC 4.6 11/21/2020   HGB 11.6 (L) 11/21/2020   HCT 38.0 11/21/2020   MCV 85.6 11/21/2020   MCH 26.1 11/21/2020   RDW 14.9 11/21/2020   PLT 215 11/21/2020   Last metabolic panel Lab Results  Component Value Date   GLUCOSE 105 (H) 11/21/2020   NA 140 11/21/2020   K 4.7 11/21/2020   CL 104 11/21/2020   CO2 27 11/21/2020   BUN 14 11/21/2020   CREATININE 0.81 11/21/2020   EGFR 75.0 12/04/2022   CALCIUM 9.7 11/21/2020   PROT 7.3 05/10/2022   ALBUMIN 4.2 11/21/2020   BILITOT 1.3 (H) 05/10/2022   ALKPHOS 59 11/21/2020   AST 18 05/10/2022   ALT 11 05/10/2022   ANIONGAP 9 11/21/2020   Last lipids Lab Results  Component Value Date   CHOL 157 11/21/2020   HDL 69 11/21/2020   LDLCALC 61 11/21/2020   TRIG 134 11/21/2020   CHOLHDL 2.3 11/21/2020   Last thyroid functions Lab Results  Component Value Date   TSH 2.621 11/21/2020   T3TOTAL 123 09/26/2017   T4TOTAL 9.8 10/07/2019   Last vitamin B12 and Folate Lab Results  Component Value Date   VITAMINB12 1,348 (H) 11/21/2020   FOLATE 11.7 10/21/2018      Assessment & Plan:    Routine Health Maintenance and Physical Exam  Immunization History  Administered Date(s) Administered   Influenza-Unspecified 06/07/2015, 08/12/2023   Tdap 07/15/2023    Health Maintenance  Topic Date Due   COVID-19 Vaccine (1) Never done   Hepatitis C Screening  Never done   Zoster Vaccines- Shingrix (1 of 2) Never done   Pneumonia Vaccine 97+ Years old (1 of 1 - PCV) Never done   DEXA SCAN  08/25/2020   INFLUENZA VACCINE  04/03/2024   MAMMOGRAM  06/09/2024   Medicare Annual Wellness (AWV)  09/17/2024   DTaP/Tdap/Td (2 - Td or Tdap) 07/14/2033   HPV VACCINES  Aged Out   Meningococcal B Vaccine  Aged Out   Colonoscopy  Discontinued    Discussed health benefits of physical  activity, and encouraged her to engage in regular exercise appropriate for her age and condition.  Problem List Items Addressed This Visit       Hypertension   Remains adequately controlled with enalapril 10 mg daily.  No medication changes indicated today.      GERD   Symptoms are adequately controlled pantoprazole 40 mg twice daily.      Hx Breast cancer, IDC, Left, Stage II, receptor+, Her2 -   Her acute concern today is recent discharge from the left breast.  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  Last week she reports a 2-3-day history of clear discharge from the peripheral 0400 region of the left breast.  She applied a warm compress and discharge resolved.  There was no tenderness or overlying erythema when she noted the discharge.  There are no acute findings on exam today.  Postradiation changes are noted under the left breast.  At the site of concern, there is a superficial, dried lesion.  Screening mammogram last updated in October 2024 did not reveal any acute findings.  Given recent history of discharge and past medical history  significant for breast cancer, diagnostic mammogram ordered today.      Hyperlipidemia   Currently prescribed atorvastatin 20 mg daily.  Repeat lipid panel ordered today.      Insomnia   Lorazepam 0.5 mg nightly remains effective for management of insomnia.  PMP reviewed and remains appropriate.  Refill provided today.      Encounter for well adult exam with abnormal findings - Primary   Annual physical completed today.  Previous records and labs reviewed. -Repeat labs ordered today -Preventative care items are largely up-to-date.  Will assist with scheduling repeat DEXA. -Diagnostic mammogram of the left breast ordered today as otherwise documented -We will tentatively plan for follow-up in 1 year for annual exam      Return in about 1 year (around  12/15/2024) for CPE.     Tobi Fortes, MD

## 2023-12-17 LAB — TSH+FREE T4
Free T4: 1.6 ng/dL (ref 0.82–1.77)
TSH: 1.72 u[IU]/mL (ref 0.450–4.500)

## 2023-12-17 LAB — HEMOGLOBIN A1C
Est. average glucose Bld gHb Est-mCnc: 131 mg/dL
Hgb A1c MFr Bld: 6.2 % — ABNORMAL HIGH (ref 4.8–5.6)

## 2023-12-17 LAB — CMP14+EGFR
ALT: 13 IU/L (ref 0–32)
AST: 28 IU/L (ref 0–40)
Albumin: 4.7 g/dL (ref 3.8–4.8)
Alkaline Phosphatase: 63 IU/L (ref 44–121)
BUN/Creatinine Ratio: 16 (ref 12–28)
BUN: 14 mg/dL (ref 8–27)
Bilirubin Total: 1.5 mg/dL — ABNORMAL HIGH (ref 0.0–1.2)
CO2: 21 mmol/L (ref 20–29)
Calcium: 10.5 mg/dL — ABNORMAL HIGH (ref 8.7–10.3)
Chloride: 100 mmol/L (ref 96–106)
Creatinine, Ser: 0.87 mg/dL (ref 0.57–1.00)
Globulin, Total: 2.9 g/dL (ref 1.5–4.5)
Glucose: 92 mg/dL (ref 70–99)
Potassium: 4.4 mmol/L (ref 3.5–5.2)
Sodium: 138 mmol/L (ref 134–144)
Total Protein: 7.6 g/dL (ref 6.0–8.5)
eGFR: 71 mL/min/{1.73_m2} (ref >59)

## 2023-12-17 LAB — SEDIMENTATION RATE: Sed Rate: 13 mm/h (ref 0–40)

## 2023-12-17 LAB — CBC WITH DIFFERENTIAL/PLATELET
Basophils Absolute: 0.1 10*3/uL (ref 0.0–0.2)
Basos: 1 %
EOS (ABSOLUTE): 0 10*3/uL (ref 0.0–0.4)
Eos: 1 %
Hematocrit: 38.5 % (ref 34.0–46.6)
Hemoglobin: 12.7 g/dL (ref 11.1–15.9)
Immature Grans (Abs): 0 10*3/uL (ref 0.0–0.1)
Immature Granulocytes: 0 %
Lymphocytes Absolute: 1.2 10*3/uL (ref 0.7–3.1)
Lymphs: 19 %
MCH: 28.5 pg (ref 26.6–33.0)
MCHC: 33 g/dL (ref 31.5–35.7)
MCV: 86 fL (ref 79–97)
Monocytes Absolute: 0.5 10*3/uL (ref 0.1–0.9)
Monocytes: 8 %
Neutrophils Absolute: 4.7 10*3/uL (ref 1.4–7.0)
Neutrophils: 71 %
Platelets: 236 10*3/uL (ref 150–450)
RBC: 4.46 x10E6/uL (ref 3.77–5.28)
RDW: 14.3 % (ref 11.7–15.4)
WBC: 6.5 10*3/uL (ref 3.4–10.8)

## 2023-12-17 LAB — LIPID PANEL
Chol/HDL Ratio: 2 ratio (ref 0.0–4.4)
Cholesterol, Total: 152 mg/dL (ref 100–199)
HDL: 76 mg/dL (ref >39)
LDL Chol Calc (NIH): 62 mg/dL (ref 0–99)
Triglycerides: 75 mg/dL (ref 0–149)
VLDL Cholesterol Cal: 14 mg/dL (ref 5–40)

## 2023-12-17 LAB — B12 AND FOLATE PANEL
Folate: 19 ng/mL (ref >3.0)
Vitamin B-12: 1498 pg/mL — ABNORMAL HIGH (ref 232–1245)

## 2023-12-17 LAB — C-REACTIVE PROTEIN: CRP: <1 mg/L (ref 0–10)

## 2023-12-17 LAB — VITAMIN D 25 HYDROXY (VIT D DEFICIENCY, FRACTURES): Vit D, 25-Hydroxy: 46.8 ng/mL (ref 30.0–100.0)

## 2024-01-01 ENCOUNTER — Other Ambulatory Visit (HOSPITAL_COMMUNITY): Payer: Self-pay | Admitting: Internal Medicine

## 2024-01-01 DIAGNOSIS — Z9889 Other specified postprocedural states: Secondary | ICD-10-CM

## 2024-01-07 ENCOUNTER — Ambulatory Visit (HOSPITAL_COMMUNITY)
Admission: RE | Admit: 2024-01-07 | Discharge: 2024-01-07 | Disposition: A | Discharge status: Home / Self Care | Source: Ambulatory Visit | Attending: Internal Medicine | Admitting: Internal Medicine

## 2024-01-07 ENCOUNTER — Encounter (HOSPITAL_COMMUNITY): Payer: Self-pay

## 2024-01-07 ENCOUNTER — Other Ambulatory Visit (HOSPITAL_COMMUNITY)

## 2024-01-07 DIAGNOSIS — Z853 Personal history of malignant neoplasm of breast: Secondary | ICD-10-CM | POA: Insufficient documentation

## 2024-01-07 DIAGNOSIS — Z9889 Other specified postprocedural states: Secondary | ICD-10-CM | POA: Insufficient documentation

## 2024-01-07 DIAGNOSIS — R92333 Mammographic heterogeneous density, bilateral breasts: Secondary | ICD-10-CM | POA: Diagnosis not present

## 2024-01-07 DIAGNOSIS — N6452 Nipple discharge: Secondary | ICD-10-CM | POA: Diagnosis not present

## 2024-02-10 ENCOUNTER — Ambulatory Visit: Payer: Self-pay

## 2024-02-10 NOTE — Telephone Encounter (Signed)
 FYI Only or Action Required?: FYI only for provider  Patient was last seen in primary care on 12/16/2023 by Tobi Fortes, MD. Called Nurse Triage reporting Skin Problem and Toe Pain. Symptoms began a week ago. Interventions attempted: Rest, hydration, or home remedies. Symptoms are: unchanged.  Triage Disposition: See PCP Within 2 Weeks  Patient/caregiver understands and will follow disposition?: Unsure             Copied from CRM [id number]. Topic: Clinical - Red Word Triage >> Feb 10, 2024  2:10 PM Oddis Bench wrote: Red Word that prompted transfer to Nurse Triage: Patient is calling about a spot between her big toe and second toe that is sore and red not swollen Reason for Disposition  [1] MILD pain (e.g., does not interfere with normal activities) AND [2] present > 7 days  Answer Assessment - Initial Assessment Questions RN offered pt appt tomorrow, pt declined. RN advised UC. Pt states she will wait to see how she feels the next few days and go to UC or call us  again if she needs to.  1. ONSET: "When did the pain start?"      1 wk 2. LOCATION: "Where is the pain located?"   (e.g., around nail, entire toe, at foot joint)      Between first and second toe 3. PAIN: "How bad is the pain?"    (Scale 1-10; or mild, moderate, severe)   -  MILD (1-3): doesn't interfere with normal activities    -  MODERATE (4-7): interferes with normal activities (e.g., work or school) or awakens from sleep, limping    -  SEVERE (8-10): excruciating pain, unable to do any normal activities, unable to walk     5/10 4. APPEARANCE: "What does the toe look like?" (e.g., redness, swelling, bruising, pallor)     "Looks like a place the size of an eraser head, when I first saw it it looked like it had been a blister and it had a place in the middle, it never oozed" 5. CAUSE: "What do you think is causing the toe pain?"     Not sure 6. OTHER SYMPTOMS: "Do you have any other symptoms?" (e.g., leg pain,  rash, fever, numbness)     Using a band-aid to cover it, friction is painful  Sore, spot is a little red, denies signs of infection, denies swelling  Big toe joint pain, ongoing  Protocols used: Toe Pain-A-AH

## 2024-02-11 NOTE — Telephone Encounter (Signed)
Patient declined  

## 2024-02-13 ENCOUNTER — Ambulatory Visit: Admitting: Gastroenterology

## 2024-02-18 ENCOUNTER — Ambulatory Visit: Admitting: Gastroenterology

## 2024-02-25 NOTE — Progress Notes (Deleted)
 Referring Provider: Melvenia Manus BRAVO, MD Primary Care Physician:  No primary care provider on file. Primary GI Physician: Dr. Cindie  No chief complaint on file.   HPI:   [name] [name] is a 73 y.o. female presenting today with a history of     Last colonoscopy 01/19/2022: Nonbleeding internal hemorrhoids, 4 mm polyp in the ascending colon.  Pathology was benign.  Recommended 10-year screening.  If benefits outweigh risk.     Last EGD 04/28/20: -benign-appearing esophageal stenosis s/p dilation -gastritis (negative H. Pylori) -normal duodenum  Right upper quadrant ultrasound 05/14/22 normal besides postcholecystectomy changes.   Fecal elastase mildly low at 181.***   CT A/P with contrast 10/31/22 unremarkable (normal pancreas).   Zenpep  60000 units caused constipation and other side effects including fatigue, dry mouth, scratchy throat. No change in stool color.    Last seen in the office 09/24/2023.  Patient brought in pictures of her stool that were semiformed and light brown color.  Stools recently more formed and less diarrhea.  Intermittent urgency, sometimes taking Pepto before going out.  Took Carafate  for a few weeks which helped reflux and now doing well using Tums as needed for breakthrough and continue with pantoprazole  twice daily.  Recommended weaning pantoprazole  to once a day, check fecal elastase and fecal fat, low-fat diet, 74-month follow-up.  Labs completed in March with fecal fat normal and fecal elastase greater than 800.   Today:      Wt Readings from Last 6 Encounters:  12/16/23 122 lb 12.8 oz (55.7 kg)  09/24/23 131 lb 6.4 oz (59.6 kg)  09/18/23 128 lb (58.1 kg)  06/10/23 128 lb 12.8 oz (58.4 kg)  03/11/23 127 lb 9.6 oz (57.9 kg)  01/10/23 129 lb 6.4 oz (58.7 kg)      Past Medical History:  Diagnosis Date   Breast cancer (HCC) 2006   left Malignant adenocarcinoma and lymphatic vessel invasion.   Functional dyspepsia 11/2012   APR 2014: PT  DECLINED BRAVO. PPIs-PROTOIX/PRILOSEC-->DEXILANT -->NEXIUM (TOO EXPENSIVE)   GERD (gastroesophageal reflux disease)    High cholesterol    Hypertension    Personal history of chemotherapy 2006   Personal history of radiation therapy 2006    Past Surgical History:  Procedure Laterality Date   BALLOON DILATION N/A 04/28/2020   Procedure: BALLOON DILATION;  Surgeon: Cindie Carlin POUR, DO;  [facility];  Service: Endoscopy;  Laterality: N/A;   BIOPSY  04/28/2020   Procedure: BIOPSY;  Surgeon: Cindie Carlin POUR, DO;  [facility];  Service: Endoscopy;;   BREAST LUMPECTOMY  2006   Left  breast   BREAST LUMPECTOMY  1994   Right breast-benign   CHOLECYSTECTOMY N/A 05/10/2016   Procedure: LAPAROSCOPIC CHOLECYSTECTOMY WITH INTRAOPERATIVE CHOLANGIOGRAM;  Surgeon: Oneil Budge, MD;  Location: [facility];  Service: General;  Laterality: N/A;   COLONOSCOPY  2009   normal colon without evidence of polyps   COLONOSCOPY WITH PROPOFOL  N/A 01/19/2022   Procedure: COLONOSCOPY WITH PROPOFOL ;  Surgeon: Cindie Carlin POUR, DO;  [facility];  Service: Endoscopy;  Laterality: N/A;  7:30am   ESOPHAGOGASTRODUODENOSCOPY  2009   normal stomach,esophagues without mass,reosion   ESOPHAGOGASTRODUODENOSCOPY  09/17/2011   SLF: Mild gastritis/NO SOURCE FOR IRON DEFICINECY ANEMIA IDENTIFIED   ESOPHAGOGASTRODUODENOSCOPY N/A 12/02/2012   SLF: 1. No source for dyspepsia identified 2. MILD Non-erosive gastritis   ESOPHAGOGASTRODUODENOSCOPY (EGD) WITH PROPOFOL  N/A 04/28/2020   Procedure: ESOPHAGOGASTRODUODENOSCOPY (EGD) WITH PROPOFOL ;  Surgeon: Cindie Carlin POUR, DO;  [facility];  Service: Endoscopy;  Laterality: N/A;  7:30am   GIVENS CAPSULE STUDY  09/26/2011   Procedure: GIVENS CAPSULE STUDY;  Surgeon: Margo CHRISTELLA Haddock, MD;  [facility];  Service: Endoscopy;  Laterality: N/A;   ILEOColonoscopy  09/17/2011   SLF: Nl colonoscopy WMO/INTERNA; HEMORRHOIDS/ NO SOURCE FOR IRON  DEFICIENCY ANEMA IDENTIFIED   LAPAROSCOPIC APPENDECTOMY N/A 06/25/2018   Procedure: APPENDECTOMY LAPAROSCOPIC;  Surgeon: Mavis Anes, MD;  [facility]: [facility];  Service: General;  Laterality: N/A;   POLYPECTOMY  01/19/2022   Procedure: POLYPECTOMY;  Surgeon: Cindie Carlin POUR, DO;  [facility];  Service: Endoscopy;;    Current Outpatient Medications  Medication Sig Dispense Refill   acetaminophen  (TYLENOL ) 325 MG tablet Take 325-650 mg by mouth daily as needed (pain).     alum & mag hydroxide-simeth (MAALOX/MYLANTA) 200-200-20 MG/5ML suspension Take 15-30 mLs by mouth daily as needed for indigestion or heartburn.     atorvastatin  (LIPITOR) 20 MG tablet Take 1 tablet (20 mg total) by mouth at bedtime. 90 tablet 3   Calcium  Carb-Cholecalciferol  (CALCIUM  + D3 PO) Take 1 tablet by mouth in the morning.     calcium  elemental as carbonate (BARIATRIC TUMS ULTRA) 400 MG chewable tablet Chew 1 tablet by mouth 3 (three) times daily as needed for indigestion or heartburn.     clobetasol cream (TEMOVATE) 0.05 % Apply 1 application topically daily as needed (irritation).     enalapril  (VASOTEC ) 10 MG tablet Take 1 tablet (10 mg total) by mouth in the morning. 90 tablet 3   hydrocortisone cream 1 % Apply 1 application. topically 2 (two) times daily as needed (skin irritation/itching).     LORazepam  (ATIVAN ) 0.5 MG tablet Take 1 tablet (0.5 mg total) by mouth at bedtime as needed for anxiety or sleep. 30 tablet 2   oxymetazoline (AFRIN) 0.05 % nasal spray Place 1 spray into both nostrils at bedtime as needed for congestion.     Pancrelipase , Lip-Prot-Amyl, (ZENPEP ) 40000-126000 units CPEP Take 1 capsule (40,000 Units total) by mouth 3 (three) times daily with meals. 1 capsule with snacks (Patient not taking: Reported on 09/24/2023) 150 capsule 3   pantoprazole  (PROTONIX ) 40 MG tablet TAKE 1 TABLET(40 MG) BY MOUTH TWICE DAILY BEFORE A MEAL 180 tablet 3   sucralfate  (CARAFATE ) 1 g tablet Take 1 tablet  (1 g total) by mouth 4 (four) times daily -  with meals and at bedtime. (Patient not taking: Reported on 09/24/2023) 120 tablet 5   valACYclovir (VALTREX) 500 MG tablet Take 500 mg by mouth as needed (for outbreaks).   12   vitamin B-12 (CYANOCOBALAMIN ) 1000 MCG tablet Take 1,000 mcg by mouth 3 (three) times a week. In the morning     Vitamins A & D (VITAMIN A & D) ointment Apply 1 application  topically as needed (irritation).     No current facility-administered medications for this visit.    Allergies as of 02/27/2024 - Review Complete 12/16/2023  Allergen Reaction Noted   Sulfa antibiotics Itching and Rash 05/09/2016   Tape Rash 05/10/2016    Family History  Problem Relation Age of Onset   Alzheimer's disease Mother    Hypertension Mother    Liver cancer Father    Diabetes Sister    Stroke Sister    Heart disease Sister    Heart disease Brother    Diabetes Brother    Colon cancer Maternal Aunt    Cancer - Colon Cousin  Anesthesia problems Neg Hx    Hypotension Neg Hx    Malignant hyperthermia Neg Hx    Pseudochol deficiency Neg Hx    Colon polyps Neg Hx     Social History   Socioeconomic History   Marital status: Married    Spouse name: Not on file   Number of children: Not on file   Years of education: Not on file   Highest education level: Not on file  Occupational History   Not on file  Tobacco Use   Smoking status: Never   Smokeless tobacco: Never  Vaping Use   Vaping status: Never Used  Substance and Sexual Activity   Alcohol use: No   Drug use: No   Sexual activity: Not Currently  Other Topics Concern   Not on file  Social History Narrative   Not on file   Social Drivers of Health   Financial Resource Strain: Low Risk  (09/18/2023)   Overall Financial Resource Strain (CARDIA)    Difficulty of Paying Living Expenses: Not hard at all  Food Insecurity: No Food Insecurity (09/18/2023)   Hunger Vital Sign    Worried About Running Out of Food in the  Last Year: Never true    Ran Out of Food in the Last Year: Never true  Transportation Needs: No Transportation Needs (09/18/2023)   PRAPARE - Administrator, Civil Service (Medical): No    Lack of Transportation (Non-Medical): No  Physical Activity: Insufficiently Active (09/18/2023)   Exercise Vital Sign    Days of Exercise per Week: 7 days    Minutes of Exercise per Session: 20 min  Stress: No Stress Concern Present (09/18/2023)   [employer] of Occupational Health - Occupational Stress Questionnaire    Feeling of Stress : Not at all  Social Connections: Socially Integrated (09/18/2023)   Social Connection and Isolation Panel    Frequency of Communication with Friends and Family: More than three times a week    Frequency of Social Gatherings with Friends and Family: More than three times a week    Attends Religious Services: More than 4 times per year    Active Member of [employer] or Organizations: Yes    Attends Engineer, structural: More than 4 times per year    Marital Status: Married    Review of Systems: Gen: Denies fever, chills, anorexia. Denies fatigue, weakness, weight loss.  CV: Denies chest pain, palpitations, syncope, peripheral edema, and claudication. Resp: Denies dyspnea at rest, cough, wheezing, coughing up blood, and pleurisy. GI: Denies vomiting blood, jaundice, and fecal incontinence.   Denies dysphagia or odynophagia. Derm: Denies rash, itching, dry skin Psych: Denies depression, anxiety, memory loss, confusion. No homicidal or suicidal ideation.  Heme: Denies bruising, bleeding, and enlarged lymph nodes.  Physical Exam: There were no vitals taken for this visit. General:   Alert and oriented. No distress noted. Pleasant and cooperative.  Head:  Normocephalic and atraumatic. Eyes:  Conjuctiva clear without scleral icterus. Heart:  S1, S2 present without murmurs appreciated. Lungs:  Clear to auscultation bilaterally. No wheezes, rales, or  rhonchi. No distress.  Abdomen:  +BS, soft, non-tender and non-distended. No rebound or guarding. No HSM or masses noted. Msk:  Symmetrical without gross deformities. Normal posture. Extremities:  Without edema. Neurologic:  Alert and  oriented x4 Psych:  Normal mood and affect.    Assessment:     Plan:  ***   Josette Centers, PA-C [facility] 02/27/2024

## 2024-02-26 ENCOUNTER — Other Ambulatory Visit: Payer: Self-pay | Admitting: Internal Medicine

## 2024-02-26 DIAGNOSIS — F5104 Psychophysiologic insomnia: Secondary | ICD-10-CM

## 2024-02-27 ENCOUNTER — Ambulatory Visit: Admitting: Gastroenterology

## 2024-03-03 NOTE — Progress Notes (Unsigned)
 GI Office Note    Referring Provider: No ref. provider found Primary Care Physician:  No primary care provider on file. Primary Gastroenterologist: Carlin POUR. Cindie, DO  Date:  03/03/2024  ID:  [name] [name], DOB 1951/01/15, MRN [id number]   Chief Complaint   No chief complaint on file.   History of Present Illness  [name] [name] is a 73 y.o. female with a history of *** presenting today with complaint of   Last colonoscopy 09/17/2011: -normal colonoscopy -small internal hemorrhoids.   -Repeat in 10 years   Last EGD 04/28/20: -benign-appearing esophageal stenosis s/p dilation -gastritis (negative H. Pylori) -normal duodenum   Blood work 05/10/2022 with T. bili 1.3, indirect bilirubin 1, direct bilirubin 0.3. Normal GGT. Right upper quadrant ultrasound 05/14/22 normal besides postcholecystectomy changes.    Fecal elastase mildly low at 181.   CT A/P with contrast 10/31/22 unremarkable (normal pancreas).    OV  01/10/23. Patient reports light-colored/yellow stools, reporting that they are abnormal and have continued decline since she stopped iron last year.  Concerned given her father died from liver cancer.  Has also reported mild epigastric discomfort at times but intermittent nature.  Dexilant  had not been helpful in the pa,st takes Mylanta on occasion as needed.  Symptoms usually relatively well-controlled with diet.Taking carafate  as well as PPI for  GERD/upper GI symptoms. Continued complaint of ongoing light colored/yellow stools ongoing since stopping iron last year. Did not notice a difference with once daily pancreatic enzymes. Given higher strength pancreatic enzyme and continue PPI regimen - consider reducing to once daily.    Patient called with progress report that pancreatic enzyme replacement (60000 units of zenpep ) was causing constipation and other side effects including fatigue, dry mouth, scratchy throat.    OV 03/11/23.  Off iron since 2022.  Since colonoscopy she has  remained concerned about her stool.  Reported her blood work in March with her PCP was normal.  She stated her sister had a portion of her pancreas removed and although not blood related she wanted to make sure her pancreas was okay.  Did not see a stool color change with low-dose Zenpep  and also tried higher dose and did not see a change in color either.  Last several weeks she reported she had not had any diarrhea and stools have been more solid, sometimes going 1-2 times per day concerned about her urgency at times.  Stool is yellow-brown color notices that more when cleaning herself.  Trying to watch what she eats but believes she was eating more fatty foods than she should.. Advised fecal fat testing if symptoms worsen. Follow low fat diet. Carafate  for severe acid reflux as needed. Advised to capture picture of stool. PPI BID and tums for breakthrough recommended.   Last office visit 09/24/23.  Reviewed stool pictures that showed semiformed light brown stools.  Reported when she That her stools were darker.  Stools more recently formed and less diarrhea, intermittently having urgency.  Sometimes takes Pepto-Bismol empirically.  Tried Carafate  for few weeks and that helped her reflux and reportedly she was able to eat what ever she wanted it without issue and was taking Tums as needed as well as her pantoprazole  twice daily.  Did report some increased gas recently possibly related to diet.  Symptoms improved after taking Pepto-Bismol.  1 good bowel movement daily rather than 2-3 times daily.  Reflux symptoms tend to be worse at night.  Denied any weight loss, nausea, vomiting, dysphagia.  Reported good appetite.  Advised wean pantoprazole  to once daily.  Obtain fecal fat and fecal elastase.  Follow low-fat diet.  Tums as needed.  Reflux diet.  Follow-up in 6 months.  Labs April 2025: Elevated B12, normal folate.  Normal vitamin D  and thyroid  function.  A1c mildly elevated at 6.2.  Normal lipid panel.  CMP  with elevated T. bili at 1.5 with normal alk phos and AST/ALT (possible history of Gilbert's).  CBC, CRP, ESR normal.     Today:    Wt Readings from Last 5 Encounters:  12/16/23 122 lb 12.8 oz (55.7 kg)  09/24/23 131 lb 6.4 oz (59.6 kg)  09/18/23 128 lb (58.1 kg)  06/10/23 128 lb 12.8 oz (58.4 kg)  03/11/23 127 lb 9.6 oz (57.9 kg)    Current Outpatient Medications  Medication Sig Dispense Refill   acetaminophen  (TYLENOL ) 325 MG tablet Take 325-650 mg by mouth daily as needed (pain).     alum & mag hydroxide-simeth (MAALOX/MYLANTA) 200-200-20 MG/5ML suspension Take 15-30 mLs by mouth daily as needed for indigestion or heartburn.     atorvastatin  (LIPITOR) 20 MG tablet Take 1 tablet (20 mg total) by mouth at bedtime. 90 tablet 3   Calcium  Carb-Cholecalciferol  (CALCIUM  + D3 PO) Take 1 tablet by mouth in the morning.     calcium  elemental as carbonate (BARIATRIC TUMS ULTRA) 400 MG chewable tablet Chew 1 tablet by mouth 3 (three) times daily as needed for indigestion or heartburn.     clobetasol cream (TEMOVATE) 0.05 % Apply 1 application topically daily as needed (irritation).     enalapril  (VASOTEC ) 10 MG tablet Take 1 tablet (10 mg total) by mouth in the morning. 90 tablet 3   hydrocortisone cream 1 % Apply 1 application. topically 2 (two) times daily as needed (skin irritation/itching).     LORazepam  (ATIVAN ) 0.5 MG tablet TAKE TWO TABLETS BY MOUTH AT BEDTIME AS NEEDED FOR ANXIETY OR SLEEP 30 tablet 0   oxymetazoline (AFRIN) 0.05 % nasal spray Place 1 spray into both nostrils at bedtime as needed for congestion.     Pancrelipase , Lip-Prot-Amyl, (ZENPEP ) 40000-126000 units CPEP Take 1 capsule (40,000 Units total) by mouth 3 (three) times daily with meals. 1 capsule with snacks (Patient not taking: Reported on 09/24/2023) 150 capsule 3   pantoprazole  (PROTONIX ) 40 MG tablet TAKE 1 TABLET(40 MG) BY MOUTH TWICE DAILY BEFORE A MEAL 180 tablet 3   sucralfate  (CARAFATE ) 1 g tablet Take 1 tablet  (1 g total) by mouth 4 (four) times daily -  with meals and at bedtime. (Patient not taking: Reported on 09/24/2023) 120 tablet 5   valACYclovir (VALTREX) 500 MG tablet Take 500 mg by mouth as needed (for outbreaks).   12   vitamin B-12 (CYANOCOBALAMIN ) 1000 MCG tablet Take 1,000 mcg by mouth 3 (three) times a week. In the morning     Vitamins A & D (VITAMIN A & D) ointment Apply 1 application  topically as needed (irritation).     No current facility-administered medications for this visit.    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   Functional dyspepsia 11/2012   APR 2014: PT DECLINED BRAVO. PPIs-PROTOIX/PRILOSEC-->DEXILANT -->NEXIUM (TOO EXPENSIVE)   GERD (gastroesophageal reflux disease)    High cholesterol    Hypertension    Personal history of chemotherapy 2006   Personal history of radiation therapy 2006    Past Surgical History:  Procedure Laterality  Date   BALLOON DILATION N/A 04/28/2020   Procedure: BALLOON DILATION;  Surgeon: Cindie Carlin POUR, DO;  [facility];  Service: Endoscopy;  Laterality: N/A;   BIOPSY  04/28/2020   Procedure: BIOPSY;  Surgeon: Cindie Carlin POUR, DO;  [facility];  Service: Endoscopy;;   BREAST LUMPECTOMY  2006   Left  breast   BREAST LUMPECTOMY  1994   Right breast-benign   CHOLECYSTECTOMY N/A 05/10/2016   Procedure: LAPAROSCOPIC CHOLECYSTECTOMY WITH INTRAOPERATIVE CHOLANGIOGRAM;  Surgeon: Oneil Budge, MD;  [facility]: [facility];  Service: General;  Laterality: N/A;   COLONOSCOPY  2009   normal colon without evidence of polyps   COLONOSCOPY WITH PROPOFOL  N/A 01/19/2022   Procedure: COLONOSCOPY WITH PROPOFOL ;  Surgeon: Cindie Carlin POUR, DO;  [facility];  Service: Endoscopy;  Laterality: N/A;  7:30am   ESOPHAGOGASTRODUODENOSCOPY  2009   normal stomach,esophagues without mass,reosion   ESOPHAGOGASTRODUODENOSCOPY  09/17/2011   SLF:  Mild gastritis/NO SOURCE FOR IRON DEFICINECY ANEMIA IDENTIFIED   ESOPHAGOGASTRODUODENOSCOPY N/A 12/02/2012   SLF: 1. No source for dyspepsia identified 2. MILD Non-erosive gastritis   ESOPHAGOGASTRODUODENOSCOPY (EGD) WITH PROPOFOL  N/A 04/28/2020   Procedure: ESOPHAGOGASTRODUODENOSCOPY (EGD) WITH PROPOFOL ;  Surgeon: Cindie Carlin POUR, DO;  [facility];  Service: Endoscopy;  Laterality: N/A;  7:30am   GIVENS CAPSULE STUDY  09/26/2011   Procedure: GIVENS CAPSULE STUDY;  Surgeon: Margo CHRISTELLA Haddock, MD;  [facility];  Service: Endoscopy;  Laterality: N/A;   ILEOColonoscopy  09/17/2011   SLF: Nl colonoscopy WMO/INTERNA; HEMORRHOIDS/ NO SOURCE FOR IRON DEFICIENCY ANEMA IDENTIFIED   LAPAROSCOPIC APPENDECTOMY N/A 06/25/2018   Procedure: APPENDECTOMY LAPAROSCOPIC;  Surgeon: Budge Oneil, MD;  Location: [facility];  Service: General;  Laterality: N/A;   POLYPECTOMY  01/19/2022   Procedure: POLYPECTOMY;  Surgeon: Cindie Carlin POUR, DO;  [facility];  Service: Endoscopy;;    Family History  Problem Relation Age of Onset   Alzheimer's disease Mother    Hypertension Mother    Liver cancer Father    Diabetes Sister    Stroke Sister    Heart disease Sister    Heart disease Brother    Diabetes Brother    Colon cancer Maternal Aunt    Cancer - Colon Cousin    Anesthesia problems Neg Hx    Hypotension Neg Hx    Malignant hyperthermia Neg Hx    Pseudochol deficiency Neg Hx    Colon polyps Neg Hx     Allergies as of 03/05/2024 - Review Complete 12/16/2023  Allergen Reaction Noted   Sulfa antibiotics Itching and Rash 05/09/2016   Tape Rash 05/10/2016    Social History   Socioeconomic History   Marital status: Married    Spouse name: Not on file   Number of children: Not on file   Years of education: Not on file   Highest education level: Not on file  Occupational History   Not on file  Tobacco Use   Smoking status: Never   Smokeless tobacco: Never  Vaping Use    Vaping status: Never Used  Substance and Sexual Activity   Alcohol use: No   Drug use: No   Sexual activity: Not Currently  Other Topics Concern   Not on file  Social History Narrative   Not on file   Social Drivers of Health   Financial Resource Strain: Low Risk  (09/18/2023)   Overall Financial Resource Strain (CARDIA)    Difficulty of Paying Living  Expenses: Not hard at all  Food Insecurity: No Food Insecurity (09/18/2023)   Hunger Vital Sign    Worried About Running Out of Food in the Last Year: Never true    Ran Out of Food in the Last Year: Never true  Transportation Needs: No Transportation Needs (09/18/2023)   PRAPARE - Administrator, Civil Service (Medical): No    Lack of Transportation (Non-Medical): No  Physical Activity: Insufficiently Active (09/18/2023)   Exercise Vital Sign    Days of Exercise per Week: 7 days    Minutes of Exercise per Session: 20 min  Stress: No Stress Concern Present (09/18/2023)   [employer] of Occupational Health - Occupational Stress Questionnaire    Feeling of Stress : Not at all  Social Connections: Socially Integrated (09/18/2023)   Social Connection and Isolation Panel    Frequency of Communication with Friends and Family: More than three times a week    Frequency of Social Gatherings with Friends and Family: More than three times a week    Attends Religious Services: More than 4 times per year    Active Member of [employer] or Organizations: Yes    Attends Engineer, structural: More than 4 times per year    Marital Status: Married     Review of Systems   Gen: Denies fever, chills, anorexia. Denies fatigue, weakness, weight loss.  CV: Denies chest pain, palpitations, syncope, peripheral edema, and claudication. Resp: Denies dyspnea at rest, cough, wheezing, coughing up blood, and pleurisy. GI: See HPI Derm: Denies rash, itching, dry skin Psych: Denies depression, anxiety, memory loss, confusion. No homicidal or  suicidal ideation.  Heme: Denies bruising, bleeding, and enlarged lymph nodes.  Physical Exam   There were no vitals taken for this visit.  General:   Alert and oriented. No distress noted. Pleasant and cooperative.  Head:  Normocephalic and atraumatic. Eyes:  Conjuctiva clear without scleral icterus. Mouth:  Oral mucosa pink and moist. Good dentition. No lesions. Lungs:  Clear to auscultation bilaterally. No wheezes, rales, or rhonchi. No distress.  Heart:  S1, S2 present without murmurs appreciated.  Abdomen:  +BS, soft, non-tender and non-distended. No rebound or guarding. No HSM or masses noted. Rectal: *** Msk:  Symmetrical without gross deformities. Normal posture. Extremities:  Without edema. Neurologic:  Alert and  oriented x4 Psych:  Alert and cooperative. Normal mood and affect.  Assessment  [name] [name] is a 73 y.o. female with a history of *** presenting today with   Pancreatic insufficiency, diarrhea:  Weight loss:  GERD:  PLAN   *** CT A/P? CXR? High protein diet Pantoprazole  *** Carafate  course? Resume zenpep ?    Charmaine Melia, MSN, FNP-BC, AGACNP-BC [facility]

## 2024-03-05 ENCOUNTER — Encounter: Payer: Self-pay | Admitting: Gastroenterology

## 2024-03-05 ENCOUNTER — Ambulatory Visit: Admitting: Gastroenterology

## 2024-03-05 VITALS — BP 134/74 | HR 114 | Temp 97.4°F | Ht 65.0 in | Wt 119.4 lb

## 2024-03-05 DIAGNOSIS — K219 Gastro-esophageal reflux disease without esophagitis: Secondary | ICD-10-CM

## 2024-03-05 DIAGNOSIS — R634 Abnormal weight loss: Secondary | ICD-10-CM | POA: Diagnosis not present

## 2024-03-05 DIAGNOSIS — R195 Other fecal abnormalities: Secondary | ICD-10-CM | POA: Diagnosis not present

## 2024-03-05 DIAGNOSIS — K8689 Other specified diseases of pancreas: Secondary | ICD-10-CM | POA: Diagnosis not present

## 2024-03-05 NOTE — Patient Instructions (Addendum)
 Calorie goal: 1500-2000 calories daily for weight gain (1300 calories to maintain weight) Protein goal: Minimum 45g daily, preferably 50g+  I am okay with you add in cereal and/or a sweet 2-3 times weekly as this is usually more calorie dense and can help with weight gain.  Will be scheduled for CT of abdomen pelvis as well as a chest x-ray.  I want you to continue your acid reflux medication.  You may monitor your weight weekly on the same day each week.  If you experience more than a 5 pound weight loss in a week please let me know immediately.  Follow-up in 8 weeks.  It was a pleasure to see you today. I want to create trusting relationships with patients. If you receive a survey regarding your visit,  I greatly appreciate you taking time to fill this out on paper or through your MyChart. I value your feedback.  Charmaine Melia, MSN, FNP-BC, AGACNP-BC [facility]

## 2024-03-23 ENCOUNTER — Ambulatory Visit (HOSPITAL_COMMUNITY)

## 2024-03-24 ENCOUNTER — Encounter (HOSPITAL_COMMUNITY): Payer: Self-pay | Admitting: Radiology

## 2024-03-24 ENCOUNTER — Ambulatory Visit (HOSPITAL_COMMUNITY)
Admission: RE | Admit: 2024-03-24 | Discharge: 2024-03-24 | Disposition: A | Discharge status: Home / Self Care | Source: Ambulatory Visit | Attending: Gastroenterology | Admitting: Gastroenterology

## 2024-03-24 ENCOUNTER — Ambulatory Visit (HOSPITAL_COMMUNITY)
Admission: RE | Admit: 2024-03-24 | Discharge: 2024-03-24 | Disposition: A | Discharge status: Home / Self Care | Source: Ambulatory Visit | Attending: Gastroenterology

## 2024-03-24 DIAGNOSIS — R634 Abnormal weight loss: Secondary | ICD-10-CM | POA: Diagnosis not present

## 2024-03-24 DIAGNOSIS — R195 Other fecal abnormalities: Secondary | ICD-10-CM | POA: Insufficient documentation

## 2024-03-24 DIAGNOSIS — K8689 Other specified diseases of pancreas: Secondary | ICD-10-CM | POA: Insufficient documentation

## 2024-03-24 DIAGNOSIS — C50919 Malignant neoplasm of unspecified site of unspecified female breast: Secondary | ICD-10-CM | POA: Diagnosis not present

## 2024-03-24 DIAGNOSIS — K5730 Diverticulosis of large intestine without perforation or abscess without bleeding: Secondary | ICD-10-CM | POA: Diagnosis not present

## 2024-03-24 MED ORDER — IOHEXOL 300 MG/ML  SOLN
100.0000 mL | Freq: Once | INTRAMUSCULAR | Status: AC | PRN
  Administered 2024-03-24: 100 mL via INTRAVENOUS

## 2024-03-25 ENCOUNTER — Ambulatory Visit: Payer: Self-pay | Admitting: Gastroenterology

## 2024-03-25 DIAGNOSIS — A609 Anogenital herpesviral infection, unspecified: Secondary | ICD-10-CM | POA: Diagnosis not present

## 2024-03-25 DIAGNOSIS — Z853 Personal history of malignant neoplasm of breast: Secondary | ICD-10-CM | POA: Diagnosis not present

## 2024-03-25 DIAGNOSIS — Z01411 Encounter for gynecological examination (general) (routine) with abnormal findings: Secondary | ICD-10-CM | POA: Diagnosis not present

## 2024-04-09 DIAGNOSIS — M2011 Hallux valgus (acquired), right foot: Secondary | ICD-10-CM | POA: Diagnosis not present

## 2024-04-09 DIAGNOSIS — M2041 Other hammer toe(s) (acquired), right foot: Secondary | ICD-10-CM | POA: Diagnosis not present

## 2024-04-09 DIAGNOSIS — L84 Corns and callosities: Secondary | ICD-10-CM | POA: Diagnosis not present

## 2024-04-21 ENCOUNTER — Telehealth: Payer: Self-pay

## 2024-04-21 ENCOUNTER — Other Ambulatory Visit: Payer: Self-pay

## 2024-04-21 DIAGNOSIS — F5104 Psychophysiologic insomnia: Secondary | ICD-10-CM

## 2024-04-21 MED ORDER — LORAZEPAM 0.5 MG PO TABS: 1.0000 mg | ORAL_TABLET | Freq: Every day | ORAL | 0 refills | Status: DC

## 2024-04-21 NOTE — Telephone Encounter (Signed)
 Patient called need copy of recent lab results printed and will come by the office to pick up

## 2024-04-21 NOTE — Telephone Encounter (Signed)
 Will be in front office ready for pick up in a envelope

## 2024-04-21 NOTE — Telephone Encounter (Signed)
 Copied from CRM #[id number]. Topic: Clinical - Medication Refill >> Apr 21, 2024  9:04 AM Willma R wrote: Medication: LORazepam  (ATIVAN ) 0.5 MG tablet  Has the patient contacted their pharmacy? Yes, call dr  This is the patient's preferred pharmacy:  [facility] [place], [place] - [id number] [place] [address] Phone: [phone] Fax: [phone]  Is this the correct pharmacy for this prescription? Yes If no, delete pharmacy and type the correct one.   Has the prescription been filled recently? No  Is the patient out of the medication? No  Has the patient been seen for an appointment in the last year OR does the patient have an upcoming appointment? Yes  Can we respond through MyChart? No  Agent: Please be advised that Rx refills may take up to 3 business days. We ask that you follow-up with your pharmacy.

## 2024-04-24 ENCOUNTER — Other Ambulatory Visit: Payer: Self-pay

## 2024-04-29 NOTE — Progress Notes (Unsigned)
 GI Office Note    Referring Provider: Bevely Doffing, FNP Primary Care Physician:  Bevely Doffing, FNP Primary Gastroenterologist: Carlin POUR. Cindie, DO  Date:  04/30/2024  ID:  Dagoberto JAYSON Shines, DOB November 04, 1950, MRN [id number]   Chief Complaint   Chief Complaint  Patient presents with   Follow-up    States that everything is still the same, no changes   History of Present Illness  [name] [name] is a 73 y.o. female with a history of chronic GERD and abnormal stool color presenting today with complaint of ongoing issues with stool color and decreased weight.   Colonoscopy 09/17/2011: -normal colonoscopy -small internal hemorrhoids.   -Repeat in 10 years   EGD 04/28/20: -benign-appearing esophageal stenosis s/p dilation -gastritis (negative H. Pylori) -normal duodenum   Colonoscopy May 2023: - Nonbleeding internal hemorrhoids - 4 mm polyp in the ascending colon - Exam otherwise normal. - Repeat colonoscopy in 5 years   Blood work 05/10/2022 with T. bili 1.3, indirect bilirubin 1, direct bilirubin 0.3. Normal GGT. Right upper quadrant ultrasound 05/14/22 normal besides postcholecystectomy changes.    Fecal elastase mildly low at 181.   CT A/P with contrast 10/31/22 unremarkable (normal pancreas).    OV  01/10/23. Patient reports light-colored/yellow stools, reporting that they are abnormal and have continued decline since she stopped iron last year.  Concerned given her father died from liver cancer.  Has also reported mild epigastric discomfort at times but intermittent nature.  Dexilant  had not been helpful in the pa,st takes Mylanta on occasion as needed.  Symptoms usually relatively well-controlled with diet.Taking carafate  as well as PPI for  GERD/upper GI symptoms. Continued complaint of ongoing light colored/yellow stools ongoing since stopping iron last year. Did not notice a difference with once daily pancreatic enzymes. Given higher strength pancreatic enzyme and continue PPI  regimen - consider reducing to once daily.    OV 03/11/23.  Off iron since 2022.  Since colonoscopy she has remained concerned about her stool.  Reported her blood work in March with her PCP was normal.  She stated her sister had a portion of her pancreas removed and although not blood related she wanted to make sure her pancreas was okay.  Did not see a stool color change with low-dose Zenpep  and also tried higher dose and did not see a change in color either.  Last several weeks she reported she had not had any diarrhea and stools have been more solid, sometimes going 1-2 times per day concerned about her urgency at times.  Stool is yellow-brown color notices that more when cleaning herself.  Trying to watch what she eats but believes she was eating more fatty foods than she should.. Advised fecal fat testing if symptoms worsen. Follow low fat diet. Carafate  for severe acid reflux as needed. Advised to capture picture of stool. PPI BID and tums for breakthrough recommended.    OV 09/24/23.  Reviewed stool pictures that showed semiformed light brown stools.  Reported when she That her stools were darker.  Stools more recently formed and less diarrhea, intermittently having urgency.  Sometimes takes Pepto-Bismol empirically.  Tried Carafate  for few weeks and that helped her reflux and reportedly she was able to eat what ever she wanted it without issue and was taking Tums as needed as well as her pantoprazole  twice daily.  Did report some increased gas recently possibly related to diet.  Symptoms improved after taking Pepto-Bismol.  1 good bowel movement daily rather than 2-3  times daily.  Reflux symptoms tend to be worse at night.  Denied any weight loss, nausea, vomiting, dysphagia.  Reported good appetite.  Advised wean pantoprazole  to once daily.  Obtain fecal fat and fecal elastase.  Follow low-fat diet.  Tums as needed.  Reflux diet.  Follow-up in 6 months.   Labs April 2025: Elevated B12, normal folate.   Normal vitamin D  and thyroid  function.  A1c mildly elevated at 6.2.  Normal lipid panel.  CMP with elevated T. bili at 1.5 with normal alk phos and AST/ALT (possible history of Gilbert's).  CBC, CRP, ESR normal.    Last office visit 03/25/24. Concern for weight loss that was unintentional. Had made some dietary changes. Does not snack much. GERD controlled with diet. Not sleeping well. Caring lots of others and stress with husband having dementia.  BM daily usually. No melena. Occasional yellow or lighter colored stools. Ordered CXR and CT A/P. Advised high protein/high cal diet, supplementation of protein, pantoprazole  40 mg daily. Consider EGD/pancreatic enzymes, or antidepressant pending weights and imaging results.   CXR 03/24/24: Stable left upper lobe scarring. No active cardiopulmonary disease.   CT A/P with contrast 03/24/24:  - No acute findings. - Normal appearance of pancreas. No radiographic evidence of mass, pancreatitis, or atrophy. - Colonic diverticulosis, without radiographic evidence of diverticulitis.    Today:  Discussed the use of AI scribe software for clinical note transcription with the patient, who gave verbal consent to proceed.  She has experienced stable weight since April, with minor fluctuations of 2-3 pounds daily. Her appetite remains good, but she is concerned about not eating the right foods, particularly in relation to her blood sugar levels.  Her diet typically includes eggs with cheese, cottage cheese with yogurt, a protein drink, and a sandwich for lunch. She occasionally consumes sweets, pasta, and fast food, but has significantly reduced her intake of fried foods and sweets since prior to April. She consumes protein drinks and bars daily, specifically Premier Protein, and aims for 1800 to 2000 calories per day as previously recommended, which she finds challenging.  She mentions stress related to her husband's health and her busy schedule, which she believes  contributes to her weight loss and dietary habits. She is concerned about maintaining her current weight, given her dietary changes and stress levels.  She has a history of prediabetes and is concerned about her blood sugar levels, especially given her family history of diabetes. Her brother and son-in-law are diabetic, and she receives dietary advice from them. She is cautious about her carbohydrate intake, particularly bread and pasta, and is mindful of her sugar consumption now and has significantly cut back on her intake of cookies, ice cream, cakes, etc.   She experiences occasional loose stools, with a yellow color, and sometimes feels the urge to defecate after eating, which often subsides. She reports gas and occasional straining during bowel movements. She has not been taking fiber supplements but is considering it to improve regularity. She has tried pancreatic enzymes in the past, which were borderline effective but did not change stool color much. has had borderline low pancreatic elastase in the past. No melena or brbpr.   Her weight is currently stable at around 119-120 pounds, and she monitors it weekly. She is not experiencing nausea, vomiting, or significant abdominal pain, and maintains a good appetite. Has busy schedule and lots of stress which was discussed at her office visit in April.       Wt Readings  from Last 5 Encounters:  04/30/24 120 lb 12.8 oz (54.8 kg)  03/05/24 119 lb 6.4 oz (54.2 kg)  12/16/23 122 lb 12.8 oz (55.7 kg)  09/24/23 131 lb 6.4 oz (59.6 kg)  09/18/23 128 lb (58.1 kg)    Current Outpatient Medications  Medication Sig Dispense Refill   acetaminophen  (TYLENOL ) 325 MG tablet Take 325-650 mg by mouth daily as needed (pain).     alum & mag hydroxide-simeth (MAALOX/MYLANTA) 200-200-20 MG/5ML suspension Take 15-30 mLs by mouth daily as needed for indigestion or heartburn.     atorvastatin  (LIPITOR) 20 MG tablet Take 1 tablet (20 mg total) by mouth at bedtime.  90 tablet 3   Calcium  Carb-Cholecalciferol  (CALCIUM  + D3 PO) Take 1 tablet by mouth in the morning.     calcium  elemental as carbonate (BARIATRIC TUMS ULTRA) 400 MG chewable tablet Chew 1 tablet by mouth 3 (three) times daily as needed for indigestion or heartburn.     clobetasol cream (TEMOVATE) 0.05 % Apply 1 application topically daily as needed (irritation).     enalapril  (VASOTEC ) 10 MG tablet Take 1 tablet (10 mg total) by mouth in the morning. 90 tablet 3   hydrocortisone cream 1 % Apply 1 application. topically 2 (two) times daily as needed (skin irritation/itching).     LORazepam  (ATIVAN ) 0.5 MG tablet Take 2 tablets (1 mg total) by mouth at bedtime. 60 tablet 0   oxymetazoline (AFRIN) 0.05 % nasal spray Place 1 spray into both nostrils at bedtime as needed for congestion.     pantoprazole  (PROTONIX ) 40 MG tablet TAKE 1 TABLET(40 MG) BY MOUTH TWICE DAILY BEFORE A MEAL 180 tablet 3   valACYclovir (VALTREX) 500 MG tablet Take 500 mg by mouth as needed (for outbreaks).   12   vitamin B-12 (CYANOCOBALAMIN ) 1000 MCG tablet Take 1,000 mcg by mouth 3 (three) times a week. In the morning     Vitamins A & D (VITAMIN A & D) ointment Apply 1 application  topically as needed (irritation).     No current facility-administered medications for this visit.    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   Functional dyspepsia 11/2012   APR 2014: PT DECLINED BRAVO. PPIs-PROTOIX/PRILOSEC-->DEXILANT -->NEXIUM (TOO EXPENSIVE)   GERD (gastroesophageal reflux disease)    High cholesterol    Hypertension    Personal history of chemotherapy 2006   Personal history of radiation therapy 2006    Past Surgical History:  Procedure Laterality Date   BALLOON DILATION N/A 04/28/2020   Procedure: BALLOON DILATION;  Surgeon: Cindie Carlin POUR, DO;  [facility];  Service: Endoscopy;  Laterality: N/A;   BIOPSY   04/28/2020   Procedure: BIOPSY;  Surgeon: Cindie Carlin POUR, DO;  [facility];  Service: Endoscopy;;   BREAST LUMPECTOMY  2006   Left  breast   BREAST LUMPECTOMY  1994   Right breast-benign   CHOLECYSTECTOMY N/A 05/10/2016   Procedure: LAPAROSCOPIC CHOLECYSTECTOMY WITH INTRAOPERATIVE CHOLANGIOGRAM;  Surgeon: Oneil Budge, MD;  Location: [facility];  Service: General;  Laterality: N/A;   COLONOSCOPY  2009   normal colon without evidence of polyps   COLONOSCOPY WITH PROPOFOL  N/A 01/19/2022   Procedure: COLONOSCOPY WITH PROPOFOL ;  Surgeon: Cindie Carlin POUR, DO;  [facility];  Service: Endoscopy;  Laterality: N/A;  7:30am   ESOPHAGOGASTRODUODENOSCOPY  2009   normal stomach,esophagues without mass,reosion   ESOPHAGOGASTRODUODENOSCOPY  09/17/2011  SLF: Mild gastritis/NO SOURCE FOR IRON DEFICINECY ANEMIA IDENTIFIED   ESOPHAGOGASTRODUODENOSCOPY N/A 12/02/2012   SLF: 1. No source for dyspepsia identified 2. MILD Non-erosive gastritis   ESOPHAGOGASTRODUODENOSCOPY (EGD) WITH PROPOFOL  N/A 04/28/2020   Procedure: ESOPHAGOGASTRODUODENOSCOPY (EGD) WITH PROPOFOL ;  Surgeon: Cindie Carlin POUR, DO;  [facility];  Service: Endoscopy;  Laterality: N/A;  7:30am   GIVENS CAPSULE STUDY  09/26/2011   Procedure: GIVENS CAPSULE STUDY;  Surgeon: Margo CHRISTELLA Haddock, MD;  [facility];  Service: Endoscopy;  Laterality: N/A;   ILEOColonoscopy  09/17/2011   SLF: Nl colonoscopy WMO/INTERNA; HEMORRHOIDS/ NO SOURCE FOR IRON DEFICIENCY ANEMA IDENTIFIED   LAPAROSCOPIC APPENDECTOMY N/A 06/25/2018   Procedure: APPENDECTOMY LAPAROSCOPIC;  Surgeon: Mavis Anes, MD;  Location: [facility];  Service: General;  Laterality: N/A;   POLYPECTOMY  01/19/2022   Procedure: POLYPECTOMY;  Surgeon: Cindie Carlin POUR, DO;  [facility];  Service: Endoscopy;;    Family History  Problem Relation Age of Onset   Alzheimer's disease Mother    Hypertension Mother    Liver cancer Father    Diabetes  Sister    Stroke Sister    Heart disease Sister    Heart disease Brother    Diabetes Brother    Colon cancer Maternal Aunt    Cancer - Colon Cousin    Anesthesia problems Neg Hx    Hypotension Neg Hx    Malignant hyperthermia Neg Hx    Pseudochol deficiency Neg Hx    Colon polyps Neg Hx     Allergies as of 04/30/2024 - Review Complete 04/30/2024  Allergen Reaction Noted   Sulfa antibiotics Itching and Rash 05/09/2016   Tape Rash 05/10/2016    Social History   Socioeconomic History   Marital status: Married    Spouse name: Not on file   Number of children: Not on file   Years of education: Not on file   Highest education level: Not on file  Occupational History   Not on file  Tobacco Use   Smoking status: Never   Smokeless tobacco: Never  Vaping Use   Vaping status: Never Used  Substance and Sexual Activity   Alcohol use: No   Drug use: No   Sexual activity: Not Currently  Other Topics Concern   Not on file  Social History Narrative   Not on file   Social Drivers of Health   Financial Resource Strain: Low Risk  (09/18/2023)   Overall Financial Resource Strain (CARDIA)    Difficulty of Paying Living Expenses: Not hard at all  Food Insecurity: No Food Insecurity (09/18/2023)   Hunger Vital Sign    Worried About Running Out of Food in the Last Year: Never true    Ran Out of Food in the Last Year: Never true  Transportation Needs: No Transportation Needs (09/18/2023)   PRAPARE - Administrator, Civil Service (Medical): No    Lack of Transportation (Non-Medical): No  Physical Activity: Insufficiently Active (09/18/2023)   Exercise Vital Sign    Days of Exercise per Week: 7 days    Minutes of Exercise per Session: 20 min  Stress: No Stress Concern Present (09/18/2023)   [employer] of Occupational Health - Occupational Stress Questionnaire    Feeling of Stress : Not at all  Social Connections: Socially Integrated (09/18/2023)   Social Connection  and Isolation Panel    Frequency of Communication with Friends and Family: More than three times a week  Frequency of Social Gatherings with Friends and Family: More than three times a week    Attends Religious Services: More than 4 times per year    Active Member of [employer] or Organizations: Yes    Attends Engineer, structural: More than 4 times per year    Marital Status: Married     Review of Systems   Gen: Denies fever, chills, anorexia. Denies fatigue, weakness, weight loss.  CV: Denies chest pain, palpitations, syncope, peripheral edema, and claudication. Resp: Denies dyspnea at rest, cough, wheezing, coughing up blood, and pleurisy. GI: See HPI Derm: Denies rash, itching, dry skin Psych: Denies depression, anxiety, memory loss, confusion. No homicidal or suicidal ideation.  Heme: Denies bruising, bleeding, and enlarged lymph nodes.  Physical Exam   BP 122/68 (BP Location: Right Arm, Patient Position: Sitting, Cuff Size: Normal)   Pulse 87   Temp 97.7 F (36.5 C) (Oral)   Ht 5' 5 (1.651 m)   Wt 120 lb 12.8 oz (54.8 kg)   SpO2 96%   BMI 20.10 kg/m   General:   Alert and oriented. No distress noted. Pleasant and cooperative.  Head:  Normocephalic and atraumatic. Eyes:  Conjuctiva clear without scleral icterus. Mouth:  Oral mucosa pink and moist. Good dentition. No lesions. Abdomen:  non-distended Rectal: deferred Msk:  Symmetrical without gross deformities. Normal posture. Extremities:  Without edema. Neurologic:  Alert and  oriented x4 Psych:  Alert and cooperative. Normal mood and affect.  Assessment  [name] [name] is a 73 y.o. female presenting today for follow up of weight loss, diet, and stools.   Unintentional weight loss, now stable Weight has stabilized since April with a good appetite. No significant alarm symptoms such as nausea, vomiting, or severe abdominal pain. CT scan shows no tumors or significant blood flow issues. Weight loss likely due to  a drastic change in diet and stress given possible prediabetes daignosis. - Continue current dietary habits to maintain weight. Goal [phone] calories daily with minimal intake of 45g protein.  - Monitor weight weekly for significant changes. - Consider antidepressant if stress becomes severe or appetite decreases. - May consider endoscopic evaluation if symptoms worsen or new symptoms develop.  - Discussed/offered nutrition referral if she continues to need assistance.  - Continue protein supplementation   Altered bowel habits with intermittent loose stools and mild constipation Intermittent loose stools and mild constipation with yellow stool color. No significant changes in bowel habits since last visit. Possible mild constipation with occasional overflow of loose stools. No current need for pancreatic enzyme supplementation as weight is stable and CT scan shows normal pancreas. Elastase has been low so meets criteria for EPI but not much change in symptoms in the past with pancreatic enzyme trial.  - Consider fiber supplementation such as Metamucil to improve bowel regularity. - Monitor bowel habits and report significant changes.      PLAN   Follow up as needed.    Charmaine Melia, MSN, FNP-BC, AGACNP-BC [facility]

## 2024-04-30 ENCOUNTER — Ambulatory Visit: Admitting: Gastroenterology

## 2024-04-30 ENCOUNTER — Encounter: Payer: Self-pay | Admitting: Gastroenterology

## 2024-04-30 VITALS — BP 122/68 | HR 87 | Temp 97.7°F | Ht 65.0 in | Wt 120.8 lb

## 2024-04-30 DIAGNOSIS — K219 Gastro-esophageal reflux disease without esophagitis: Secondary | ICD-10-CM

## 2024-04-30 DIAGNOSIS — R634 Abnormal weight loss: Secondary | ICD-10-CM

## 2024-04-30 DIAGNOSIS — R195 Other fecal abnormalities: Secondary | ICD-10-CM | POA: Diagnosis not present

## 2024-04-30 NOTE — Patient Instructions (Addendum)
 Calorie goal: 1300 calories to maintain weight. If you want to gain wait continue with that goal of 1500-2000 calories daily.  Protein goal: Minimum 45g daily.  You may reduce the protein supplement intake if you would like but I do recommend at least 1/day whether that is a shake or protein bar.  The option is up to you and your taste as far as what you want.   I still am okay with you adding in cereal a few times per week and is also okay if you eat a sandwich at lunchtime every single day if that is what you would like to do.  I think this carb intake with bread in moderation like this is fine and I do not anticipate any large increase in your A1c because of this.  If you change your mind on seeing nutrition or dietitian please let me know I will be happy to send referral for you.  Again we have the option of repursuing some pancreatic enzyme therapy or bile acid binding agent to help with the yellower stools although that could potentially constipate you more.  If you want to try a fiber supplement such as Metamucil and powder or capsule form this is good for your overall colon health.  Continue taking your pantoprazole  to help with your reflux as needed.  Please let me know if you need a refill.   You may monitor your weight weekly on the same day each week.  Let me know if you experience a more than 5 pound drop or fluctuation.  Follow-up as needed, especially if you are having any alarm symptoms like blood present in your stool, decreased appetite, significant further weight loss, chest pain, shortness of breath, abdominal pain, etc.  It was a pleasure to see you today. I want to create trusting relationships with patients. If you receive a survey regarding your visit,  I greatly appreciate you taking time to fill this out on paper or through your MyChart. I value your feedback.  Charmaine Melia, MSN, FNP-BC, AGACNP-BC [facility]

## 2024-06-15 ENCOUNTER — Other Ambulatory Visit (HOSPITAL_COMMUNITY): Payer: Self-pay

## 2024-06-15 DIAGNOSIS — Z1231 Encounter for screening mammogram for malignant neoplasm of breast: Secondary | ICD-10-CM

## 2024-06-17 ENCOUNTER — Telehealth: Payer: Self-pay

## 2024-06-17 ENCOUNTER — Other Ambulatory Visit: Payer: Self-pay

## 2024-06-17 DIAGNOSIS — F5104 Psychophysiologic insomnia: Secondary | ICD-10-CM

## 2024-06-17 NOTE — Telephone Encounter (Unsigned)
 Copied from CRM #[id number]. Topic: Clinical - Medication Refill >> Jun 17, 2024  3:49 PM Santiya F wrote: Medication: LORazepam  (ATIVAN ) 0.5 MG tablet [[id number]]  Has the patient contacted their pharmacy? Yes  (Agent: If yes, when and what did the pharmacy advise?) Contact office, pharmacy sent a request and has not heard back.   This is the patient's preferred pharmacy:  [facility] [place], [place] - [id number] [place] [address] Phone: [phone] Fax: [phone]  Is this the correct pharmacy for this prescription? Yes   Has the prescription been filled recently? Yes  Is the patient out of the medication? No  Has the patient been seen for an appointment in the last year OR does the patient have an upcoming appointment? Yes  Can we respond through MyChart? No  Agent: Please be advised that Rx refills may take up to 3 business days. We ask that you follow-up with your pharmacy.

## 2024-06-17 NOTE — Telephone Encounter (Signed)
 Copied from CRM #[id number]. Topic: General - Other >> Jun 17, 2024  3:52 PM Santiya F wrote: Reason for CRM: Patient is calling in wanting to know if Dr. Tobie will see her and her husband as patients. She says they don't mind seeing Leita, but with the health issues they have they would like to be under the care of an MD. Please follow up with patient.

## 2024-06-18 MED ORDER — LORAZEPAM 0.5 MG PO TABS: 1.0000 mg | ORAL_TABLET | Freq: Every day | ORAL | 0 refills | Status: DC

## 2024-06-18 NOTE — Telephone Encounter (Signed)
Scheduled with patel

## 2024-06-22 ENCOUNTER — Other Ambulatory Visit (HOSPITAL_COMMUNITY): Payer: Self-pay | Admitting: Internal Medicine

## 2024-06-22 ENCOUNTER — Ambulatory Visit (HOSPITAL_COMMUNITY)
Admission: RE | Admit: 2024-06-22 | Discharge: 2024-06-22 | Disposition: A | Discharge status: Home / Self Care | Source: Ambulatory Visit

## 2024-06-22 DIAGNOSIS — Z1231 Encounter for screening mammogram for malignant neoplasm of breast: Secondary | ICD-10-CM | POA: Diagnosis not present

## 2024-06-29 ENCOUNTER — Other Ambulatory Visit: Payer: Self-pay

## 2024-07-20 ENCOUNTER — Ambulatory Visit

## 2024-08-17 ENCOUNTER — Other Ambulatory Visit: Payer: Self-pay

## 2024-08-17 ENCOUNTER — Other Ambulatory Visit: Payer: Self-pay | Admitting: Internal Medicine

## 2024-08-17 DIAGNOSIS — F5104 Psychophysiologic insomnia: Secondary | ICD-10-CM

## 2024-08-17 NOTE — Telephone Encounter (Unsigned)
 Copied from CRM #[id number]. Topic: Clinical - Medication Refill >> Aug 17, 2024  1:34 PM Everette C wrote: Medication: LORazepam  (ATIVAN ) 0.5 MG tablet [[id number]]  Has the patient contacted their pharmacy? Yes (Agent: If no, request that the patient contact the pharmacy for the refill. If patient does not wish to contact the pharmacy document the reason why and proceed with request.) (Agent: If yes, when and what did the pharmacy advise?)  This is the patient's preferred pharmacy:  [facility] [place], [place] - [id number] [place] [address] Phone: [phone] Fax: [phone]  Is this the correct pharmacy for this prescription? Yes If no, delete pharmacy and type the correct one.   Has the prescription been filled recently? Yes  Is the patient out of the medication? No  Has the patient been seen for an appointment in the last year OR does the patient have an upcoming appointment? Yes  Can we respond through MyChart? No  Agent: Please be advised that Rx refills may take up to 3 business days. We ask that you follow-up with your pharmacy.

## 2024-08-24 ENCOUNTER — Other Ambulatory Visit: Payer: Self-pay

## 2024-09-22 ENCOUNTER — Ambulatory Visit: Payer: PPO

## 2024-09-22 VITALS — Ht 65.0 in | Wt 120.0 lb

## 2024-09-22 DIAGNOSIS — Z0000 Encounter for general adult medical examination without abnormal findings: Secondary | ICD-10-CM | POA: Diagnosis not present

## 2024-09-22 DIAGNOSIS — Z780 Asymptomatic menopausal state: Secondary | ICD-10-CM

## 2024-09-22 NOTE — Patient Instructions (Signed)
 [name] [name],  Thank you for taking the time for your Medicare Wellness Visit. I appreciate your continued commitment to your health goals. Please review the care plan we discussed, and feel free to reach out if I can assist you further.  Please note that Annual Wellness Visits do not include a physical exam. Some assessments may be limited, especially if the visit was conducted virtually. If needed, we may recommend an in-person follow-up with your provider.  Ongoing Care Seeing your primary care provider every 3 to 6 months helps us  monitor your health and provide consistent, personalized care.   Referrals If a referral was made during today's visit and you haven't received any updates within two weeks, please contact the referred provider directly to check on the status.  Osteoporosis Screening An order was placed for you to have your Osteoporosis Screening. Call the number below to schedule that [facility]  [phone]   Recommended Screenings:  Health Maintenance  Topic Date Due   COVID-19 Vaccine (1) Never done   Hepatitis C Screening  Never done   Zoster (Shingles) Vaccine (1 of 2) Never done   Pneumococcal Vaccine for age over 45 (1 of 1 - PCV) Never done   Osteoporosis screening with Bone Density Scan  08/25/2020   Medicare Annual Wellness Visit  09/17/2024   Breast Cancer Screening  06/22/2025   DTaP/Tdap/Td vaccine (2 - Td or Tdap) 07/14/2033   Flu Shot  Completed   Meningitis B Vaccine  Aged Out   Colon Cancer Screening  Discontinued       09/22/2024    8:09 AM  Advanced Directives  Does Patient Have a Medical Advance Directive? Yes  Type of Estate Agent of [place];Living will  Copy of Healthcare Power of Attorney in Chart? No - copy requested    Vision: Annual vision screenings are recommended for early detection of glaucoma, cataracts, and diabetic retinopathy. These exams can also reveal signs of chronic conditions such as diabetes and  high blood pressure.  Dental: Annual dental screenings help detect early signs of oral cancer, gum disease, and other conditions linked to overall health, including heart disease and diabetes.  Please see the attached documents for additional preventive care recommendations.

## 2024-09-22 NOTE — Progress Notes (Signed)
 "  Chief Complaint  Patient presents with   Medicare Wellness     Subjective:   [name] [name] is a 74 y.o. female who presents for a Medicare Annual Wellness Visit.  Visit info / Clinical Intake: Medicare Wellness Visit Type:: Subsequent Annual Wellness Visit Persons participating in visit and providing information:: patient Medicare Wellness Visit Mode:: Telephone If telephone:: video declined Since this visit was completed virtually, some vitals may be partially provided or unavailable. Missing vitals are due to the limitations of the virtual format.: Documented vitals are patient reported If Telephone or Video please confirm:: I connected with patient using audio/video enable telemedicine. I verified patient identity with two identifiers, discussed telehealth limitations, and patient agreed to proceed. Patient Location:: home Provider Location:: office Interpreter Needed?: No Pre-visit prep was completed: yes AWV questionnaire completed by patient prior to visit?: no Living arrangements:: lives with spouse/significant other Patient's Overall Health Status Rating: good Typical amount of pain: none Does pain affect daily life?: no Are you currently prescribed opioids?: no  Dietary Habits and Nutritional Risks How many meals a day?: 2 Eats fruit and vegetables daily?: yes Most meals are obtained by: preparing own meals In the last 2 weeks, have you had any of the following?: none Diabetic:: no  Functional Status Activities of Daily Living (to include ambulation/medication): Independent Ambulation: Independent Medication Administration: Independent Home Management (perform basic housework or laundry): Independent Manage your own finances?: yes Primary transportation is: driving Concerns about vision?: no *vision screening is required for WTM* Concerns about hearing?: no  Fall Screening Falls in the past year?: 0 Number of falls in past year: 0 Was there an injury with  Fall?: 0 Fall Risk Category Calculator: 0 Patient Fall Risk Level: Low Fall Risk  Fall Risk Patient at Risk for Falls Due to: No Fall Risks Fall risk Follow up: Falls evaluation completed; Education provided; Falls prevention discussed  Home and Transportation Safety: All rugs have non-skid backing?: yes All stairs or steps have railings?: yes Grab bars in the bathtub or shower?: yes Have non-skid surface in bathtub or shower?: (!) no Good home lighting?: yes Regular seat belt use?: yes Hospital stays in the last year:: no  Cognitive Assessment Difficulty concentrating, remembering, or making decisions? : no Will 6CIT or Mini Cog be Completed: yes What year is it?: 0 points What month is it?: 0 points Give patient an address phrase to remember (5 components): [address] [place] About what time is it?: 0 points Count backwards from 20 to 1: 0 points Say the months of the year in reverse: 0 points Repeat the address phrase from earlier: 0 points 6 CIT Score: 0 points  Advance Directives (For Healthcare) Does Patient Have a Medical Advance Directive?: Yes Type of Advance Directive: Healthcare Power of [place]; Living will Copy of Healthcare Power of Attorney in Chart?: No - copy requested Copy of Living Will in Chart?: No - copy requested  Reviewed/Updated  Reviewed/Updated: Reviewed All (Medical, Surgical, Family, Medications, Allergies, Care Teams, Patient Goals)    Allergies (verified) Sulfa antibiotics and Tape   Current Medications (verified) Outpatient Encounter Medications as of 09/22/2024  Medication Sig   acetaminophen  (TYLENOL ) 325 MG tablet Take 325-650 mg by mouth daily as needed (pain).   alum & mag hydroxide-simeth (MAALOX/MYLANTA) 200-200-20 MG/5ML suspension Take 15-30 mLs by mouth daily as needed for indigestion or heartburn.   atorvastatin  (LIPITOR) 20 MG tablet Take 1 tablet (20 mg total) by mouth at bedtime.  Calcium  Carb-Cholecalciferol   (CALCIUM  + D3 PO) Take 1 tablet by mouth in the morning.   calcium  elemental as carbonate (BARIATRIC TUMS ULTRA) 400 MG chewable tablet Chew 1 tablet by mouth 3 (three) times daily as needed for indigestion or heartburn.   clobetasol cream (TEMOVATE) 0.05 % Apply 1 application topically daily as needed (irritation).   enalapril  (VASOTEC ) 10 MG tablet Take 1 tablet (10 mg total) by mouth in the morning.   hydrocortisone cream 1 % Apply 1 application. topically 2 (two) times daily as needed (skin irritation/itching).   LORazepam  (ATIVAN ) 0.5 MG tablet Take 2 tablets (1 mg total) by mouth at bedtime.   oxymetazoline (AFRIN) 0.05 % nasal spray Place 1 spray into both nostrils at bedtime as needed for congestion.   pantoprazole  (PROTONIX ) 40 MG tablet TAKE 1 TABLET(40 MG) BY MOUTH TWICE DAILY BEFORE A MEAL   valACYclovir (VALTREX) 500 MG tablet Take 500 mg by mouth as needed (for outbreaks).    vitamin B-12 (CYANOCOBALAMIN ) 1000 MCG tablet Take 1,000 mcg by mouth 3 (three) times a week. In the morning   Vitamins A & D (VITAMIN A & D) ointment Apply 1 application  topically as needed (irritation).   No facility-administered encounter medications on file as of 09/22/2024.    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   Functional dyspepsia 11/2012   APR 2014: PT DECLINED BRAVO. PPIs-PROTOIX/PRILOSEC-->DEXILANT -->NEXIUM (TOO EXPENSIVE)   GERD (gastroesophageal reflux disease)    High cholesterol    Hypertension    Personal history of chemotherapy 2006   Personal history of radiation therapy 2006   Past Surgical History:  Procedure Laterality Date   BALLOON DILATION N/A 04/28/2020   Procedure: BALLOON DILATION;  Surgeon: Cindie Carlin POUR, DO;  [facility];  Service: Endoscopy;  Laterality: N/A;   BIOPSY  04/28/2020   Procedure: BIOPSY;  Surgeon: Cindie Carlin POUR, DO;  [facility];  Service: Endoscopy;;   BREAST LUMPECTOMY  2006   Left  breast   BREAST LUMPECTOMY  1994   Right breast-benign   CHOLECYSTECTOMY N/A 05/10/2016   Procedure: LAPAROSCOPIC CHOLECYSTECTOMY WITH INTRAOPERATIVE CHOLANGIOGRAM;  Surgeon: Oneil Budge, MD;  Location: [facility];  Service: General;  Laterality: N/A;   COLONOSCOPY  2009   normal colon without evidence of polyps   COLONOSCOPY WITH PROPOFOL  N/A 01/19/2022   Procedure: COLONOSCOPY WITH PROPOFOL ;  Surgeon: Cindie Carlin POUR, DO;  [facility];  Service: Endoscopy;  Laterality: N/A;  7:30am   ESOPHAGOGASTRODUODENOSCOPY  2009   normal stomach,esophagues without mass,reosion   ESOPHAGOGASTRODUODENOSCOPY  09/17/2011   SLF: Mild gastritis/NO SOURCE FOR IRON DEFICINECY ANEMIA IDENTIFIED   ESOPHAGOGASTRODUODENOSCOPY N/A 12/02/2012   SLF: 1. No source for dyspepsia identified 2. MILD Non-erosive gastritis   ESOPHAGOGASTRODUODENOSCOPY (EGD) WITH PROPOFOL  N/A 04/28/2020   Procedure: ESOPHAGOGASTRODUODENOSCOPY (EGD) WITH PROPOFOL ;  Surgeon: Cindie Carlin POUR, DO;  [facility];  Service: Endoscopy;  Laterality: N/A;  7:30am   GIVENS CAPSULE STUDY  09/26/2011   Procedure: GIVENS CAPSULE STUDY;  Surgeon: Margo CHRISTELLA Haddock, MD;  [facility];  Service: Endoscopy;  Laterality: N/A;   ILEOColonoscopy  09/17/2011   SLF: Nl colonoscopy WMO/INTERNA; HEMORRHOIDS/ NO SOURCE FOR IRON DEFICIENCY ANEMA IDENTIFIED   LAPAROSCOPIC APPENDECTOMY N/A 06/25/2018   Procedure: APPENDECTOMY LAPAROSCOPIC;  Surgeon: Budge Oneil, MD;  Location: [facility];  Service: General;  Laterality: N/A;   POLYPECTOMY  01/19/2022   Procedure: POLYPECTOMY;  Surgeon: Cindie Carlin POUR, DO;  [facility];  Service: Endoscopy;;   Family History  Problem Relation Age of Onset   Alzheimer's disease Mother    Hypertension Mother    Liver cancer Father    Diabetes Sister    Stroke Sister    Heart disease Sister    Heart disease Brother    Diabetes  Brother    Colon cancer Maternal Aunt    Cancer - Colon Cousin    Anesthesia problems Neg Hx    Hypotension Neg Hx    Malignant hyperthermia Neg Hx    Pseudochol deficiency Neg Hx    Colon polyps Neg Hx    Social History   Occupational History   Not on file  Tobacco Use   Smoking status: Never   Smokeless tobacco: Never  Vaping Use   Vaping status: Never Used  Substance and Sexual Activity   Alcohol use: No   Drug use: No   Sexual activity: Not Currently   Tobacco Counseling Counseling given: Yes  SDOH Screenings   Food Insecurity: No Food Insecurity (09/22/2024)  Housing: Low Risk (09/22/2024)  Transportation Needs: No Transportation Needs (09/22/2024)  Utilities: Not At Risk (09/22/2024)  Alcohol Screen: Low Risk (09/18/2023)  Depression (PHQ2-9): Low Risk (09/22/2024)  Financial Resource Strain: Low Risk (09/18/2023)  Physical Activity: Inactive (09/22/2024)  Social Connections: Moderately Integrated (09/22/2024)  Stress: Stress Concern Present (09/22/2024)  Tobacco Use: Low Risk (09/22/2024)  Health Literacy: Adequate Health Literacy (09/22/2024)   See flowsheets for full screening details  Depression Screen PHQ 2 & 9 Depression Scale- Over the past 2 weeks, how often have you been bothered by any of the following problems? Little interest or pleasure in doing things: 0 Feeling down, depressed, or hopeless (PHQ Adolescent also includes...irritable): 0 PHQ-2 Total Score: 0 Trouble falling or staying asleep, or sleeping too much: 3 (pt states this is normal for her) Feeling tired or having little energy: 0 Poor appetite or overeating (PHQ Adolescent also includes...weight loss): 0 Feeling bad about yourself - or that you are a failure or have let yourself or your family down: 0 Trouble concentrating on things, such as reading the newspaper or watching television (PHQ Adolescent also includes...like school work): 0 Moving or speaking so slowly that other people could have  noticed. Or the opposite - being so fidgety or restless that you have been moving around a lot more than usual: 0 Thoughts that you would be better off dead, or of hurting yourself in some way: 0 PHQ-9 Total Score: 3 If you checked off any problems, how difficult have these problems made it for you to do your work, take care of things at home, or get along with other people?: Not difficult at all  Depression Treatment Depression Interventions/Treatment : [id number] Score <4 Follow-up Not Indicated     Goals Addressed             This Visit's Progress    Patient Stated   On track    To remain healthy, active, and independent.              Objective:    Today's Vitals   09/22/24 0805  Weight: 120 lb (54.4 kg)  Height: 5' 5 (1.651 m)   Body mass index is 19.97 kg/m.  Hearing/Vision screen Hearing Screening - Comments:: Patient denies any hearing difficulties.   Vision Screening - Comments:: Patient sees Oneil Kawasaki at [facility]  [place] location. She will call to schedule that appt Immunizations and Health Maintenance Health Maintenance  Topic Date Due   COVID-19 Vaccine (1) Never done   Hepatitis C Screening  Never done   Zoster Vaccines- Shingrix (1 of 2) Never done   Pneumococcal Vaccine: 50+ Years (1 of 1 - PCV) Never done   Bone Density Scan  08/25/2020   Medicare Annual Wellness (AWV)  09/17/2024   Mammogram  06/22/2025   DTaP/Tdap/Td (2 - Td or Tdap) 07/14/2033   Influenza Vaccine  Completed   Meningococcal B Vaccine  Aged Out   Colonoscopy  Discontinued        Assessment/Plan:  This is a routine wellness examination for Samanthamarie.  Patient Care Team: Tobie Suzzane POUR, MD as PCP - General (Internal Medicine) Cindie Carlin POUR, DO as Consulting Physician (Internal Medicine) Darroll Anes, DO (Optometry) Alvan, Dorn FALCON, MD as Consulting Physician (Cardiology)  I have personally reviewed and noted the following in the patients chart:   Medical and  social history Use of alcohol, tobacco or illicit drugs  Current medications and supplements including opioid prescriptions. Functional ability and status Nutritional status Physical activity Advanced directives List of other physicians Hospitalizations, surgeries, and ER visits in previous 12 months Vitals Screenings to include cognitive, depression, and falls Referrals and appointments  Orders Placed This Encounter  Procedures   DG Bone Density    Standing Status:   Future    Expiration Date:   09/22/2025    Reason for Exam (SYMPTOM  OR DIAGNOSIS REQUIRED):   post menopausal estrogen deficient    Preferred imaging location?:   [facility]   In addition, I have reviewed and discussed with patient certain preventive protocols, quality metrics, and best practice recommendations. A written personalized care plan for preventive services as well as general preventive health recommendations were provided to patient.   Donella Pascarella, CMA   09/22/2024   Return September 23, 2025 at 8:00 am, for In office Medicare Well Visit w  Wellness Nurse.  After Visit Summary: (Declined) Due to this being a telephonic visit, with patients personalized plan was offered to patient but patient Declined AVS at this time   "

## 2024-09-23 ENCOUNTER — Encounter (HOSPITAL_COMMUNITY): Payer: Self-pay | Admitting: Hematology and Oncology

## 2024-10-02 ENCOUNTER — Other Ambulatory Visit: Payer: Self-pay | Admitting: Gastroenterology

## 2024-10-02 DIAGNOSIS — K219 Gastro-esophageal reflux disease without esophagitis: Secondary | ICD-10-CM

## 2024-12-21 ENCOUNTER — Encounter

## 2024-12-21 ENCOUNTER — Encounter: Admitting: Internal Medicine

## 2025-09-24 ENCOUNTER — Ambulatory Visit: Payer: Self-pay
